# Patient Record
Sex: Female | Born: 1960 | Race: White | Hispanic: No | Smoking: Never smoker
Health system: Southern US, Community
[De-identification: ages and names within clinical notes are randomized; demographics above are authoritative.]

## PROBLEM LIST (undated history)

## (undated) DIAGNOSIS — R42 Dizziness and giddiness: Secondary | ICD-10-CM

## (undated) DIAGNOSIS — I341 Nonrheumatic mitral (valve) prolapse: Secondary | ICD-10-CM

## (undated) DIAGNOSIS — I2510 Atherosclerotic heart disease of native coronary artery without angina pectoris: Secondary | ICD-10-CM

## (undated) DIAGNOSIS — M79673 Pain in unspecified foot: Secondary | ICD-10-CM

## (undated) DIAGNOSIS — I209 Angina pectoris, unspecified: Secondary | ICD-10-CM

## (undated) DIAGNOSIS — Z9221 Personal history of antineoplastic chemotherapy: Secondary | ICD-10-CM

## (undated) DIAGNOSIS — C50919 Malignant neoplasm of unspecified site of unspecified female breast: Secondary | ICD-10-CM

## (undated) DIAGNOSIS — I10 Essential (primary) hypertension: Secondary | ICD-10-CM

## (undated) DIAGNOSIS — G5601 Carpal tunnel syndrome, right upper limb: Secondary | ICD-10-CM

## (undated) DIAGNOSIS — D473 Essential (hemorrhagic) thrombocythemia: Secondary | ICD-10-CM

## (undated) DIAGNOSIS — R87612 Low grade squamous intraepithelial lesion on cytologic smear of cervix (LGSIL): Secondary | ICD-10-CM

## (undated) DIAGNOSIS — F419 Anxiety disorder, unspecified: Secondary | ICD-10-CM

## (undated) DIAGNOSIS — G4730 Sleep apnea, unspecified: Secondary | ICD-10-CM

## (undated) DIAGNOSIS — M79643 Pain in unspecified hand: Secondary | ICD-10-CM

## (undated) DIAGNOSIS — M1990 Unspecified osteoarthritis, unspecified site: Secondary | ICD-10-CM

## (undated) DIAGNOSIS — Z923 Personal history of irradiation: Secondary | ICD-10-CM

## (undated) DIAGNOSIS — E785 Hyperlipidemia, unspecified: Secondary | ICD-10-CM

## (undated) HISTORY — PX: CARDIAC CATHETERIZATION: SHX172

## (undated) HISTORY — PX: TUBAL LIGATION: SHX77

## (undated) HISTORY — PX: CHOLECYSTECTOMY: SHX55

## (undated) HISTORY — DX: Pain in unspecified hand: M79.643

## (undated) HISTORY — DX: Malignant neoplasm of unspecified site of unspecified female breast: C50.919

## (undated) HISTORY — DX: Essential (primary) hypertension: I10

## (undated) HISTORY — PX: EYE SURGERY: SHX253

## (undated) HISTORY — DX: Unspecified osteoarthritis, unspecified site: M19.90

## (undated) HISTORY — DX: Atherosclerotic heart disease of native coronary artery without angina pectoris: I25.10

## (undated) HISTORY — DX: Pain in unspecified foot: M79.673

## (undated) HISTORY — DX: Hyperlipidemia, unspecified: E78.5

---

## 2005-01-16 ENCOUNTER — Ambulatory Visit: Payer: Self-pay | Admitting: Unknown Physician Specialty

## 2006-08-15 ENCOUNTER — Ambulatory Visit: Payer: Self-pay | Admitting: Unknown Physician Specialty

## 2007-09-22 ENCOUNTER — Emergency Department: Payer: Self-pay | Admitting: Emergency Medicine

## 2008-08-11 ENCOUNTER — Ambulatory Visit: Payer: Self-pay | Admitting: Unknown Physician Specialty

## 2009-06-14 ENCOUNTER — Ambulatory Visit: Payer: Self-pay | Admitting: Unknown Physician Specialty

## 2009-06-25 ENCOUNTER — Ambulatory Visit: Payer: Self-pay | Admitting: Unknown Physician Specialty

## 2009-07-29 ENCOUNTER — Ambulatory Visit: Payer: Self-pay | Admitting: Gastroenterology

## 2010-06-28 HISTORY — PX: HYSTEROSCOPY: SHX211

## 2010-08-04 ENCOUNTER — Ambulatory Visit: Payer: Self-pay | Admitting: Internal Medicine

## 2013-03-01 ENCOUNTER — Emergency Department: Payer: Self-pay | Admitting: Emergency Medicine

## 2013-03-01 LAB — URINALYSIS, COMPLETE
Bacteria: NONE SEEN
Blood: NEGATIVE
Glucose,UR: NEGATIVE mg/dL (ref 0–75)
Ketone: NEGATIVE
Leukocyte Esterase: NEGATIVE
Nitrite: NEGATIVE
RBC,UR: 1 /HPF (ref 0–5)
Specific Gravity: 1.01 (ref 1.003–1.030)
Squamous Epithelial: 1
WBC UR: <1 /HPF (ref 0–5)

## 2013-03-01 LAB — CBC
MCH: 31.7 pg (ref 26.0–34.0)
Platelet: 313 10*3/uL (ref 150–440)
RBC: 4.4 10*6/uL (ref 3.80–5.20)

## 2013-03-01 LAB — BASIC METABOLIC PANEL
Anion Gap: 7 (ref 7–16)
Co2: 23 mmol/L (ref 21–32)
EGFR (Non-African Amer.): >60
Osmolality: 277 (ref 275–301)

## 2013-07-16 ENCOUNTER — Ambulatory Visit: Payer: Self-pay | Admitting: Otolaryngology

## 2013-07-16 LAB — BASIC METABOLIC PANEL
Anion Gap: 10 (ref 7–16)
BUN: 16 mg/dL (ref 7–18)
Calcium, Total: 9.4 mg/dL (ref 8.5–10.1)
Co2: 28 mmol/L (ref 21–32)
Creatinine: 0.76 mg/dL (ref 0.60–1.30)
EGFR (African American): >60
EGFR (Non-African Amer.): >60
Glucose: 112 mg/dL — ABNORMAL HIGH (ref 65–99)
Potassium: 4.4 mmol/L (ref 3.5–5.1)
Sodium: 141 mmol/L (ref 136–145)

## 2013-10-21 ENCOUNTER — Ambulatory Visit: Payer: Self-pay | Admitting: Otolaryngology

## 2013-10-21 LAB — BASIC METABOLIC PANEL
ANION GAP: 12 (ref 7–16)
BUN: 20 mg/dL — ABNORMAL HIGH (ref 7–18)
CO2: 25 mmol/L (ref 21–32)
Calcium, Total: 9.4 mg/dL (ref 8.5–10.1)
Chloride: 99 mmol/L (ref 98–107)
Creatinine: 0.88 mg/dL (ref 0.60–1.30)
EGFR (African American): >60
Glucose: 97 mg/dL (ref 65–99)
OSMOLALITY: 274 (ref 275–301)
Potassium: 4 mmol/L (ref 3.5–5.1)
Sodium: 136 mmol/L (ref 136–145)

## 2013-11-17 ENCOUNTER — Emergency Department: Payer: Self-pay | Admitting: Emergency Medicine

## 2013-11-17 LAB — COMPREHENSIVE METABOLIC PANEL
ALT: 18 U/L (ref 12–78)
Albumin: 3.6 g/dL (ref 3.4–5.0)
Alkaline Phosphatase: 61 U/L
Anion Gap: 7 (ref 7–16)
BILIRUBIN TOTAL: 0.3 mg/dL (ref 0.2–1.0)
BUN: 15 mg/dL (ref 7–18)
CALCIUM: 9.5 mg/dL (ref 8.5–10.1)
Chloride: 104 mmol/L (ref 98–107)
Co2: 26 mmol/L (ref 21–32)
Creatinine: 0.91 mg/dL (ref 0.60–1.30)
EGFR (African American): >60
Glucose: 93 mg/dL (ref 65–99)
Osmolality: 274 (ref 275–301)
Potassium: 3.4 mmol/L — ABNORMAL LOW (ref 3.5–5.1)
SGOT(AST): 13 U/L — ABNORMAL LOW (ref 15–37)
SODIUM: 137 mmol/L (ref 136–145)
TOTAL PROTEIN: 7.5 g/dL (ref 6.4–8.2)

## 2013-11-17 LAB — PRO B NATRIURETIC PEPTIDE: B-TYPE NATIURETIC PEPTID: 33 pg/mL (ref 0–125)

## 2013-11-17 LAB — TROPONIN I: Troponin-I: <0.02 ng/mL

## 2013-11-17 LAB — CBC
HCT: 38.6 % (ref 35.0–47.0)
HGB: 13.2 g/dL (ref 12.0–16.0)
MCH: 31.3 pg (ref 26.0–34.0)
MCHC: 34.3 g/dL (ref 32.0–36.0)
MCV: 91 fL (ref 80–100)
Platelet: 319 10*3/uL (ref 150–440)
RBC: 4.23 10*6/uL (ref 3.80–5.20)
RDW: 12.6 % (ref 11.5–14.5)
WBC: 6.3 10*3/uL (ref 3.6–11.0)

## 2014-01-23 ENCOUNTER — Other Ambulatory Visit: Payer: Self-pay | Admitting: Cardiovascular Disease

## 2014-01-23 LAB — CREATININE, SERUM
Creatinine: 1.06 mg/dL (ref 0.60–1.30)
EGFR (African American): >60
EGFR (Non-African Amer.): 60 — ABNORMAL LOW

## 2014-01-23 LAB — BUN: BUN: 18 mg/dL (ref 7–18)

## 2014-01-23 LAB — HEMOGLOBIN: HGB: 14.1 g/dL (ref 12.0–16.0)

## 2014-01-23 LAB — POTASSIUM: Potassium: 3.4 mmol/L — ABNORMAL LOW (ref 3.5–5.1)

## 2014-01-23 LAB — HEMATOCRIT: HCT: 41.6 % (ref 35.0–47.0)

## 2014-01-26 ENCOUNTER — Ambulatory Visit: Payer: Self-pay | Admitting: Cardiovascular Disease

## 2014-02-03 DIAGNOSIS — R079 Chest pain, unspecified: Secondary | ICD-10-CM | POA: Insufficient documentation

## 2014-02-03 DIAGNOSIS — IMO0001 Reserved for inherently not codable concepts without codable children: Secondary | ICD-10-CM | POA: Insufficient documentation

## 2014-02-03 DIAGNOSIS — E785 Hyperlipidemia, unspecified: Secondary | ICD-10-CM | POA: Insufficient documentation

## 2014-02-03 DIAGNOSIS — I2510 Atherosclerotic heart disease of native coronary artery without angina pectoris: Secondary | ICD-10-CM | POA: Insufficient documentation

## 2014-02-03 DIAGNOSIS — I10 Essential (primary) hypertension: Secondary | ICD-10-CM | POA: Insufficient documentation

## 2014-02-03 DIAGNOSIS — M791 Myalgia, unspecified site: Secondary | ICD-10-CM | POA: Insufficient documentation

## 2014-02-13 DIAGNOSIS — I341 Nonrheumatic mitral (valve) prolapse: Secondary | ICD-10-CM | POA: Insufficient documentation

## 2014-03-31 ENCOUNTER — Encounter: Payer: Self-pay | Admitting: Cardiovascular Disease

## 2014-04-28 ENCOUNTER — Encounter: Payer: Self-pay | Admitting: Cardiovascular Disease

## 2014-05-20 ENCOUNTER — Ambulatory Visit (INDEPENDENT_AMBULATORY_CARE_PROVIDER_SITE_OTHER): Payer: BC Managed Care – PPO

## 2014-05-20 ENCOUNTER — Encounter: Payer: Self-pay | Admitting: Podiatry

## 2014-05-20 ENCOUNTER — Ambulatory Visit (INDEPENDENT_AMBULATORY_CARE_PROVIDER_SITE_OTHER): Payer: BC Managed Care – PPO | Admitting: Podiatry

## 2014-05-20 VITALS — BP 112/72 | HR 75 | Resp 16 | Ht 66.0 in | Wt 210.0 lb

## 2014-05-20 DIAGNOSIS — M722 Plantar fascial fibromatosis: Secondary | ICD-10-CM

## 2014-05-20 NOTE — Progress Notes (Signed)
She presents today after having I seen her for approximately one year complaining of painful bilateral heels. She states that while walking on the beach and running digits his as well as exercising she's noticed that her feet seem to be doing worse. She states that they're usually sore but seem to be able to manage them. However she has not been able to manage them since wearing different shoes over the summer.  Objective: Vital signs are stable she is alert and oriented x3. She has pain on palpation medial continued tubercles bilateral. Pulses are palpable bilateral. No open wounds are noted. Radiographic evaluation demonstrates soft tissue increase in density at the plantar fascial calcaneal insertion site indicative of plantar fasciitis.  Assessment: Pain in heels associated with plantar fasciitis bilateral.  Plan: Discussed appropriate shoe gear stretching exercises ice therapy shoe gear modifications. Going as far as her right down several types of shoes. I injected her bilateral heels today with Kenalog and local anesthetic. She states that she wears her orthotics on occasions but not necessarily while she is working out.

## 2014-05-28 ENCOUNTER — Encounter: Payer: Self-pay | Admitting: Cardiovascular Disease

## 2014-06-28 ENCOUNTER — Encounter: Payer: Self-pay | Admitting: Cardiovascular Disease

## 2014-07-28 ENCOUNTER — Encounter: Payer: Self-pay | Admitting: Cardiovascular Disease

## 2015-04-27 LAB — HM MAMMOGRAPHY

## 2016-04-12 ENCOUNTER — Ambulatory Visit (INDEPENDENT_AMBULATORY_CARE_PROVIDER_SITE_OTHER): Payer: BLUE CROSS/BLUE SHIELD | Admitting: Podiatry

## 2016-04-12 ENCOUNTER — Ambulatory Visit (INDEPENDENT_AMBULATORY_CARE_PROVIDER_SITE_OTHER): Payer: BLUE CROSS/BLUE SHIELD

## 2016-04-12 ENCOUNTER — Encounter: Payer: Self-pay | Admitting: Podiatry

## 2016-04-12 VITALS — BP 134/88 | HR 78 | Resp 12

## 2016-04-12 DIAGNOSIS — M7661 Achilles tendinitis, right leg: Secondary | ICD-10-CM

## 2016-04-12 NOTE — Progress Notes (Signed)
She presents today with chief complaint of pain to the right posterior aspect of her heel. She states this than bothering her for quite some time but her plantar fasciitis has resolved 100%. She states that recently she has had had to have a root canal performed and she's been taking Tylenol and ibuprofen regularly she states that she notices that her heel is doing much better.  Objective: Vital signs are stable she is alert and oriented 3 pulses are palpable no calf pain. She has pain on direct palpation of the Achilles tendon on the posterior aspect of the calcaneus. Radiographs taken today demonstrate an osseously mature individual lateral view demonstrates a thickening of the Achilles at its insertion site with a very small proximally oriented calcaneal heel spur. No signs of intratendinous calcification. No open lesions or wounds on cutaneous evaluation.  Assessment: Insertional Achilles tendinitis right heel.  Plan: I injected subcutaneously today 2 mg of dexamethasone at the site of maximal tenderness. I recommended that she utilize her night splint that she has at home for nighttime use as well as starting One-A-Day meloxicam. I will follow-up with her in 1 month we also discussed the etiology pathology conservative versus surgical therapies we also discussed appropriate shoe gear stretching exercises and ice therapy.

## 2016-07-05 LAB — HM PAP SMEAR

## 2016-12-26 ENCOUNTER — Encounter: Payer: Self-pay | Admitting: *Deleted

## 2016-12-28 NOTE — Discharge Instructions (Signed)
Cataract Surgery, Care After °Refer to this sheet in the next few weeks. These instructions provide you with information about caring for yourself after your procedure. Your health care provider may also give you more specific instructions. Your treatment has been planned according to current medical practices, but problems sometimes occur. Call your health care provider if you have any problems or questions after your procedure. °What can I expect after the procedure? °After the procedure, it is common to have: °· Itching. °· Discomfort. °· Fluid discharge. °· Sensitivity to light and to touch. °· Bruising. °Follow these instructions at home: °Eye Care  °· Check your eye every day for signs of infection. Watch for: °¨ Redness, swelling, or pain. °¨ Fluid, blood, or pus. °¨ Warmth. °¨ Bad smell. °Activity  °· Avoid strenuous activities, such as playing contact sports, for as long as told by your health care provider. °· Do not drive or operate heavy machinery until your health care provider approves. °· Do not bend or lift heavy objects . Bending increases pressure in the eye. You can walk, climb stairs, and do light household chores. °· Ask your health care provider when you can return to work. If you work in a dusty environment, you may be advised to wear protective eyewear for a period of time. °General instructions  °· Take or apply over-the-counter and prescription medicines only as told by your health care provider. This includes eye drops. °· Do not touch or rub your eyes. °· If you were given a protective shield, wear it as told by your health care provider. If you were not given a protective shield, wear sunglasses as told by your health care provider to protect your eyes. °· Keep the area around your eye clean and dry. Avoid swimming or allowing water to hit you directly in the face while showering until told by your health care provider. Keep soap and shampoo out of your eyes. °· Do not put a contact lens  into the affected eye or eyes until your health care provider approves. °· Keep all follow-up visits as told by your health care provider. This is important. °Contact a health care provider if: ° °· You have increased bruising around your eye. °· You have pain that is not helped with medicine. °· You have a fever. °· You have redness, swelling, or pain in your eye. °· You have fluid, blood, or pus coming from your incision. °· Your vision gets worse. °Get help right away if: °· You have sudden vision loss. °This information is not intended to replace advice given to you by your health care provider. Make sure you discuss any questions you have with your health care provider. °Document Released: 03/03/2005 Document Revised: 12/23/2015 Document Reviewed: 06/24/2015 °Elsevier Interactive Patient Education © 2017 [employer] ° ° ° ° °General Anesthesia, Adult, Care After °These instructions provide you with information about caring for yourself after your procedure. Your health care provider may also give you more specific instructions. Your treatment has been planned according to current medical practices, but problems sometimes occur. Call your health care provider if you have any problems or questions after your procedure. °What can I expect after the procedure? °After the procedure, it is common to have: °· Vomiting. °· A sore throat. °· Mental slowness. °It is common to feel: °· Nauseous. °· Cold or shivery. °· Sleepy. °· Tired. °· Sore or achy, even in parts of your body where you did not have surgery. °Follow these instructions at   home: °For at least 24 hours after the procedure:  °· Do not: °¨ Participate in activities where you could fall or become injured. °¨ Drive. °¨ Use heavy machinery. °¨ Drink alcohol. °¨ Take sleeping pills or medicines that cause drowsiness. °¨ Make important decisions or sign legal documents. °¨ Take care of children on your own. °· Rest. °Eating and drinking  °· If you vomit, drink  water, juice, or soup when you can drink without vomiting. °· Drink enough fluid to keep your urine clear or pale yellow. °· Make sure you have little or no nausea before eating solid foods. °· Follow the diet recommended by your health care provider. °General instructions  °· Have a responsible adult stay with you until you are awake and alert. °· Return to your normal activities as told by your health care provider. Ask your health care provider what activities are safe for you. °· Take over-the-counter and prescription medicines only as told by your health care provider. °· If you smoke, do not smoke without supervision. °· Keep all follow-up visits as told by your health care provider. This is important. °Contact a health care provider if: °· You continue to have nausea or vomiting at home, and medicines are not helpful. °· You cannot drink fluids or start eating again. °· You cannot urinate after 8-12 hours. °· You develop a skin rash. °· You have fever. °· You have increasing redness at the site of your procedure. °Get help right away if: °· You have difficulty breathing. °· You have chest pain. °· You have unexpected bleeding. °· You feel that you are having a life-threatening or urgent problem. °This information is not intended to replace advice given to you by your health care provider. Make sure you discuss any questions you have with your health care provider. °Document Released: 11/20/2000 Document Revised: 01/17/2016 Document Reviewed: 07/29/2015 °Elsevier Interactive Patient Education © 2017 [employer] ° °

## 2017-01-01 ENCOUNTER — Ambulatory Visit: Payer: BLUE CROSS/BLUE SHIELD | Admitting: Anesthesiology

## 2017-01-01 ENCOUNTER — Ambulatory Visit
Admission: RE | Admit: 2017-01-01 | Discharge: 2017-01-01 | Disposition: A | Discharge status: Home / Self Care | Payer: BLUE CROSS/BLUE SHIELD | Source: Ambulatory Visit | Attending: Ophthalmology | Admitting: Ophthalmology

## 2017-01-01 ENCOUNTER — Encounter
Admission: RE | Disposition: A | Discharge status: Home / Self Care | Payer: Self-pay | Source: Ambulatory Visit | Attending: Ophthalmology

## 2017-01-01 DIAGNOSIS — I10 Essential (primary) hypertension: Secondary | ICD-10-CM | POA: Insufficient documentation

## 2017-01-01 DIAGNOSIS — G709 Myoneural disorder, unspecified: Secondary | ICD-10-CM | POA: Insufficient documentation

## 2017-01-01 DIAGNOSIS — H2512 Age-related nuclear cataract, left eye: Secondary | ICD-10-CM | POA: Insufficient documentation

## 2017-01-01 HISTORY — DX: Carpal tunnel syndrome, right upper limb: G56.01

## 2017-01-01 HISTORY — DX: Nonrheumatic mitral (valve) prolapse: I34.1

## 2017-01-01 HISTORY — PX: CATARACT EXTRACTION W/PHACO: SHX586

## 2017-01-01 HISTORY — DX: Dizziness and giddiness: R42

## 2017-01-01 SURGERY — PHACOEMULSIFICATION, CATARACT, WITH IOL INSERTION
Anesthesia: Monitor Anesthesia Care | Site: Eye | Laterality: Left | Wound class: Clean

## 2017-01-01 MED ORDER — CEFUROXIME OPHTHALMIC INJECTION 1 MG/0.1 ML
INJECTION | OPHTHALMIC | Status: DC | PRN
  Administered 2017-01-01: .3 mL via OPHTHALMIC

## 2017-01-01 MED ORDER — MOXIFLOXACIN HCL 0.5 % OP SOLN
1.0000 [drp] | OPHTHALMIC | Status: DC | PRN
  Administered 2017-01-01 (×3): 1 [drp] via OPHTHALMIC

## 2017-01-01 MED ORDER — NA HYALUR & NA CHOND-NA HYALUR 0.4-0.35 ML IO KIT
PACK | INTRAOCULAR | Status: DC | PRN
Start: 2017-01-01 — End: 2017-01-01
  Administered 2017-01-01: 1 mL via INTRAOCULAR

## 2017-01-01 MED ORDER — BALANCED SALT IO SOLN
INTRAOCULAR | Status: DC | PRN
  Administered 2017-01-01: 1 mL via INTRAOCULAR

## 2017-01-01 MED ORDER — EPINEPHRINE PF 1 MG/ML IJ SOLN
INTRAOCULAR | Status: DC | PRN
  Administered 2017-01-01: 64 mL via OPHTHALMIC

## 2017-01-01 MED ORDER — BRIMONIDINE TARTRATE-TIMOLOL 0.2-0.5 % OP SOLN
OPHTHALMIC | Status: DC | PRN
  Administered 2017-01-01: 1 [drp] via OPHTHALMIC

## 2017-01-01 MED ORDER — LACTATED RINGERS IV SOLN: INTRAVENOUS | Status: DC

## 2017-01-01 MED ORDER — ARMC OPHTHALMIC DILATING DROPS
1.0000 "application " | OPHTHALMIC | Status: DC | PRN
  Administered 2017-01-01 (×3): 1 via OPHTHALMIC

## 2017-01-01 MED ORDER — MIDAZOLAM HCL 2 MG/2ML IJ SOLN
INTRAMUSCULAR | Status: DC | PRN
  Administered 2017-01-01: 2 mg via INTRAVENOUS

## 2017-01-01 MED ORDER — FENTANYL CITRATE (PF) 100 MCG/2ML IJ SOLN
INTRAMUSCULAR | Status: DC | PRN
  Administered 2017-01-01: 50 ug via INTRAVENOUS

## 2017-01-01 SURGICAL SUPPLY — 25 items
CANNULA ANT/CHMB 27GA (MISCELLANEOUS) ×2 IMPLANT
CARTRIDGE ABBOTT (MISCELLANEOUS) IMPLANT
GLOVE SURG LX 7.5 STRW (GLOVE) ×1
GLOVE SURG LX STRL 7.5 STRW (GLOVE) ×1 IMPLANT
GLOVE SURG TRIUMPH 8.0 PF LTX (GLOVE) ×2 IMPLANT
GOWN STRL REUS W/ TWL LRG LVL3 (GOWN DISPOSABLE) ×2 IMPLANT
GOWN STRL REUS W/TWL LRG LVL3 (GOWN DISPOSABLE) ×2
LENS IOL TECNIS ITEC 15.5 (Intraocular Lens) ×2 IMPLANT
MARKER SKIN DUAL TIP RULER LAB (MISCELLANEOUS) ×2 IMPLANT
NDL RETROBULBAR .5 NSTRL (NEEDLE) IMPLANT
NEEDLE FILTER BLUNT 18X 1/2SAF (NEEDLE) ×1
NEEDLE FILTER BLUNT 18X1 1/2 (NEEDLE) ×1 IMPLANT
PACK CATARACT BRASINGTON (MISCELLANEOUS) ×2 IMPLANT
PACK EYE AFTER SURG (MISCELLANEOUS) ×2 IMPLANT
PACK OPTHALMIC (MISCELLANEOUS) ×2 IMPLANT
RING MALYGIN 7.0 (MISCELLANEOUS) IMPLANT
SUT ETHILON 10-0 CS-B-6CS-B-6 (SUTURE)
SUT VICRYL  9 0 (SUTURE)
SUT VICRYL 9 0 (SUTURE) IMPLANT
SUTURE EHLN 10-0 CS-B-6CS-B-6 (SUTURE) IMPLANT
SYR 3ML LL SCALE MARK (SYRINGE) ×2 IMPLANT
SYR 5ML LL (SYRINGE) ×2 IMPLANT
SYR TB 1ML LUER SLIP (SYRINGE) ×2 IMPLANT
WATER STERILE IRR 250ML POUR (IV SOLUTION) ×2 IMPLANT
WIPE NON LINTING 3.25X3.25 (MISCELLANEOUS) ×2 IMPLANT

## 2017-01-01 NOTE — Anesthesia Procedure Notes (Signed)
Procedure Name: MAC Performed by: Arlando Leisinger Pre-anesthesia Checklist: Patient identified, Emergency Drugs available, Suction available, Timeout performed and Patient being monitored Patient Re-evaluated:Patient Re-evaluated prior to inductionOxygen Delivery Method: Nasal cannula Placement Confirmation: positive ETCO2     

## 2017-01-01 NOTE — Op Note (Signed)
OPERATIVE NOTE  Erin Church [id number] 01/01/2017   PREOPERATIVE DIAGNOSIS:  Nuclear sclerotic cataract left eye. H25.12   POSTOPERATIVE DIAGNOSIS:    Nuclear sclerotic cataract left eye.     PROCEDURE:  Phacoemusification with posterior chamber intraocular lens placement of the left eye   LENS:   Implant Name Type Inv. Item Serial No. Manufacturer Lot No. LRB No. Used  LENS IOL DIOP 15.5 - [id number] Intraocular Lens LENS IOL DIOP 15.5 [id number] AMO   Left 1        ULTRASOUND TIME: 11  % of 1 minutes 2 seconds, CDE 7.1  SURGEON:  Wyonia Hough, MD   ANESTHESIA:  Topical with tetracaine drops and 2% Xylocaine jelly, augmented with 1% preservative-free intracameral lidocaine.    COMPLICATIONS:  None.   DESCRIPTION OF PROCEDURE:  The patient was identified in the holding room and transported to the operating room and placed in the supine position under the operating microscope.  The left eye was identified as the operative eye and it was prepped and draped in the usual sterile ophthalmic fashion.   A 1 millimeter clear-corneal paracentesis was made at the 1:30 position.  0.5 ml of preservative-free 1% lidocaine was injected into the anterior chamber.  The anterior chamber was filled with Viscoat viscoelastic.  A 2.4 millimeter keratome was used to make a near-clear corneal incision at the 10:30 position.  .  A curvilinear capsulorrhexis was made with a cystotome and capsulorrhexis forceps.  Balanced salt solution was used to hydrodissect and hydrodelineate the nucleus.   Phacoemulsification was then used in stop and chop fashion to remove the lens nucleus and epinucleus.  The remaining cortex was then removed using the irrigation and aspiration handpiece. Provisc was then placed into the capsular bag to distend it for lens placement.  A lens was then injected into the capsular bag.  The remaining viscoelastic was aspirated.   Wounds were hydrated with balanced salt  solution.  The anterior chamber was inflated to a physiologic pressure with balanced salt solution.  No wound leaks were noted. Cefuroxime 0.1 ml of a 10mg /ml solution was injected into the anterior chamber for a dose of 1 mg of intracameral antibiotic at the completion of the case.   Timolol and Brimonidine drops were applied to the eye.  The patient was taken to the recovery room in stable condition without complications of anesthesia or surgery.  Erin Church 01/01/2017, 8:07 AM

## 2017-01-01 NOTE — H&P (Signed)
The History and Physical notes are on paper, have been signed, and are to be scanned. The patient remains stable and unchanged from the H&P.   Previous H&P reviewed, patient examined, and there are no changes.  Erin Church 01/01/2017 7:40 AM

## 2017-01-01 NOTE — Anesthesia Postprocedure Evaluation (Signed)
Anesthesia Post Note  Patient: Erin Church  Procedure(s) Performed: Procedure(s) (LRB): CATARACT EXTRACTION PHACO AND INTRAOCULAR LENS PLACEMENT (IOC)left Restor lens (Left)  Patient location during evaluation: PACU Anesthesia Type: MAC Level of consciousness: awake and alert and oriented Pain management: satisfactory to patient Vital Signs Assessment: post-procedure vital signs reviewed and stable Respiratory status: spontaneous breathing, nonlabored ventilation and respiratory function stable Cardiovascular status: blood pressure returned to baseline and stable Postop Assessment: Adequate PO intake and No signs of nausea or vomiting Anesthetic complications: no    Raliegh Ip

## 2017-01-01 NOTE — Anesthesia Preprocedure Evaluation (Signed)
Anesthesia Evaluation  Patient identified by MRN, date of birth, ID band Patient awake    Reviewed: Allergy & Precautions, H&P , NPO status , Patient's Chart, lab work & pertinent test results  Airway Mallampati: II  TM Distance: >3 FB Neck ROM: full    Dental no notable dental hx.    Pulmonary    Pulmonary exam normal        Cardiovascular hypertension, Normal cardiovascular exam     Neuro/Psych  Neuromuscular disease    GI/Hepatic   Endo/Other    Renal/GU      Musculoskeletal   Abdominal   Peds  Hematology   Anesthesia Other Findings   Reproductive/Obstetrics                             Anesthesia Physical Anesthesia Plan  ASA: II  Anesthesia Plan: MAC   Post-op Pain Management:    Induction:   Airway Management Planned:   Additional Equipment:   Intra-op Plan:   Post-operative Plan:   Informed Consent: I have reviewed the patients History and Physical, chart, labs and discussed the procedure including the risks, benefits and alternatives for the proposed anesthesia with the patient or authorized representative who has indicated his/her understanding and acceptance.     Plan Discussed with:   Anesthesia Plan Comments:         Anesthesia Quick Evaluation

## 2017-01-01 NOTE — Transfer of Care (Signed)
Immediate Anesthesia Transfer of Care Note  Patient: Erin Church  Procedure(s) Performed: Procedure(s) with comments: CATARACT EXTRACTION PHACO AND INTRAOCULAR LENS PLACEMENT (IOC)left Restor lens (Left) - Restor lens  Patient Location: PACU  Anesthesia Type: MAC  Level of Consciousness: awake, alert  and patient cooperative  Airway and Oxygen Therapy: Patient Spontanous Breathing and Patient connected to supplemental oxygen  Post-op Assessment: Post-op Vital signs reviewed, Patient's Cardiovascular Status Stable, Respiratory Function Stable, Patent Airway and No signs of Nausea or vomiting  Post-op Vital Signs: Reviewed and stable  Complications: No apparent anesthesia complications

## 2017-01-02 ENCOUNTER — Encounter: Payer: Self-pay | Admitting: Ophthalmology

## 2017-01-25 NOTE — Discharge Instructions (Signed)

## 2017-01-31 ENCOUNTER — Ambulatory Visit: Payer: BLUE CROSS/BLUE SHIELD | Admitting: Anesthesiology

## 2017-01-31 ENCOUNTER — Encounter
Admission: RE | Disposition: A | Discharge status: Home / Self Care | Payer: Self-pay | Source: Ambulatory Visit | Attending: Ophthalmology

## 2017-01-31 ENCOUNTER — Ambulatory Visit
Admission: RE | Admit: 2017-01-31 | Discharge: 2017-01-31 | Disposition: A | Discharge status: Home / Self Care | Payer: BLUE CROSS/BLUE SHIELD | Source: Ambulatory Visit | Attending: Ophthalmology | Admitting: Ophthalmology

## 2017-01-31 DIAGNOSIS — Z9841 Cataract extraction status, right eye: Secondary | ICD-10-CM | POA: Insufficient documentation

## 2017-01-31 DIAGNOSIS — R42 Dizziness and giddiness: Secondary | ICD-10-CM | POA: Diagnosis not present

## 2017-01-31 DIAGNOSIS — I10 Essential (primary) hypertension: Secondary | ICD-10-CM | POA: Diagnosis not present

## 2017-01-31 DIAGNOSIS — H2511 Age-related nuclear cataract, right eye: Secondary | ICD-10-CM | POA: Insufficient documentation

## 2017-01-31 DIAGNOSIS — Z9049 Acquired absence of other specified parts of digestive tract: Secondary | ICD-10-CM | POA: Insufficient documentation

## 2017-01-31 DIAGNOSIS — F419 Anxiety disorder, unspecified: Secondary | ICD-10-CM | POA: Insufficient documentation

## 2017-01-31 HISTORY — PX: CATARACT EXTRACTION W/PHACO: SHX586

## 2017-01-31 SURGERY — PHACOEMULSIFICATION, CATARACT, WITH IOL INSERTION
Anesthesia: Monitor Anesthesia Care | Site: Eye | Laterality: Right | Wound class: Clean

## 2017-01-31 MED ORDER — MOXIFLOXACIN HCL 0.5 % OP SOLN
1.0000 [drp] | OPHTHALMIC | Status: DC | PRN
  Administered 2017-01-31 (×3): 1 [drp] via OPHTHALMIC

## 2017-01-31 MED ORDER — ACETAMINOPHEN 160 MG/5ML PO SOLN: 325.0000 mg | ORAL | Status: DC | PRN

## 2017-01-31 MED ORDER — ARMC OPHTHALMIC DILATING DROPS
1.0000 "application " | OPHTHALMIC | Status: DC | PRN
  Administered 2017-01-31 (×3): 1 via OPHTHALMIC

## 2017-01-31 MED ORDER — ACETAMINOPHEN 325 MG PO TABS: 325.0000 mg | ORAL_TABLET | ORAL | Status: DC | PRN

## 2017-01-31 MED ORDER — ONDANSETRON HCL 4 MG/2ML IJ SOLN: 4.0000 mg | Freq: Once | INTRAMUSCULAR | Status: DC | PRN

## 2017-01-31 MED ORDER — EPINEPHRINE PF 1 MG/ML IJ SOLN
INTRAOCULAR | Status: DC | PRN
  Administered 2017-01-31: 61 mL via OPHTHALMIC

## 2017-01-31 MED ORDER — BRIMONIDINE TARTRATE-TIMOLOL 0.2-0.5 % OP SOLN
OPHTHALMIC | Status: DC | PRN
  Administered 2017-01-31: 1 [drp] via OPHTHALMIC

## 2017-01-31 MED ORDER — MIDAZOLAM HCL 2 MG/2ML IJ SOLN
INTRAMUSCULAR | Status: DC | PRN
  Administered 2017-01-31: 2 mg via INTRAVENOUS

## 2017-01-31 MED ORDER — FENTANYL CITRATE (PF) 100 MCG/2ML IJ SOLN
INTRAMUSCULAR | Status: DC | PRN
  Administered 2017-01-31: 50 ug via INTRAVENOUS

## 2017-01-31 MED ORDER — NA HYALUR & NA CHOND-NA HYALUR 0.4-0.35 ML IO KIT
PACK | INTRAOCULAR | Status: DC | PRN
  Administered 2017-01-31: 1 mL via INTRAOCULAR

## 2017-01-31 MED ORDER — LIDOCAINE HCL (PF) 2 % IJ SOLN
INTRAOCULAR | Status: DC | PRN
  Administered 2017-01-31: 1 mL via INTRAOCULAR

## 2017-01-31 MED ORDER — CEFUROXIME OPHTHALMIC INJECTION 1 MG/0.1 ML
INJECTION | OPHTHALMIC | Status: DC | PRN
  Administered 2017-01-31: .3 mL via OPHTHALMIC

## 2017-01-31 SURGICAL SUPPLY — 25 items
CANNULA ANT/CHMB 27GA (MISCELLANEOUS) ×3 IMPLANT
CARTRIDGE ABBOTT (MISCELLANEOUS) IMPLANT
GLOVE SURG LX 7.5 STRW (GLOVE) ×2
GLOVE SURG LX STRL 7.5 STRW (GLOVE) ×1 IMPLANT
GLOVE SURG TRIUMPH 8.0 PF LTX (GLOVE) ×3 IMPLANT
GOWN STRL REUS W/ TWL LRG LVL3 (GOWN DISPOSABLE) ×2 IMPLANT
GOWN STRL REUS W/TWL LRG LVL3 (GOWN DISPOSABLE) ×4
LENS IOL TECNIS ITEC 15.5 (Intraocular Lens) ×3 IMPLANT
MARKER SKIN DUAL TIP RULER LAB (MISCELLANEOUS) ×3 IMPLANT
NDL RETROBULBAR .5 NSTRL (NEEDLE) IMPLANT
NEEDLE FILTER BLUNT 18X 1/2SAF (NEEDLE) ×2
NEEDLE FILTER BLUNT 18X1 1/2 (NEEDLE) ×1 IMPLANT
PACK CATARACT BRASINGTON (MISCELLANEOUS) ×3 IMPLANT
PACK EYE AFTER SURG (MISCELLANEOUS) ×3 IMPLANT
PACK OPTHALMIC (MISCELLANEOUS) ×3 IMPLANT
RING MALYGIN 7.0 (MISCELLANEOUS) IMPLANT
SUT ETHILON 10-0 CS-B-6CS-B-6 (SUTURE)
SUT VICRYL  9 0 (SUTURE)
SUT VICRYL 9 0 (SUTURE) IMPLANT
SUTURE EHLN 10-0 CS-B-6CS-B-6 (SUTURE) IMPLANT
SYR 3ML LL SCALE MARK (SYRINGE) ×3 IMPLANT
SYR 5ML LL (SYRINGE) ×3 IMPLANT
SYR TB 1ML LUER SLIP (SYRINGE) ×3 IMPLANT
WATER STERILE IRR 250ML POUR (IV SOLUTION) ×3 IMPLANT
WIPE NON LINTING 3.25X3.25 (MISCELLANEOUS) ×3 IMPLANT

## 2017-01-31 NOTE — H&P (Signed)
The History and Physical notes are on paper, have been signed, and are to be scanned. The patient remains stable and unchanged from the H&P.   Previous H&P reviewed, patient examined, and there are no changes.  Erin Church 01/31/2017 11:12 AM   

## 2017-01-31 NOTE — Anesthesia Preprocedure Evaluation (Signed)
Anesthesia Evaluation  Patient identified by MRN, date of birth, ID band Patient awake    Reviewed: Allergy & Precautions, H&P , NPO status , Patient's Chart, lab work & pertinent test results  Airway Mallampati: II  TM Distance: >3 FB Neck ROM: full    Dental no notable dental hx.    Pulmonary    Pulmonary exam normal        Cardiovascular hypertension, Normal cardiovascular exam     Neuro/Psych  Neuromuscular disease    GI/Hepatic   Endo/Other    Renal/GU      Musculoskeletal   Abdominal   Peds  Hematology   Anesthesia Other Findings   Reproductive/Obstetrics                             Anesthesia Physical  Anesthesia Plan  ASA: II  Anesthesia Plan: MAC   Post-op Pain Management:    Induction:   PONV Risk Score and Plan:   Airway Management Planned:   Additional Equipment:   Intra-op Plan:   Post-operative Plan:   Informed Consent: I have reviewed the patients History and Physical, chart, labs and discussed the procedure including the risks, benefits and alternatives for the proposed anesthesia with the patient or authorized representative who has indicated his/her understanding and acceptance.     Plan Discussed with:   Anesthesia Plan Comments:         Anesthesia Quick Evaluation

## 2017-01-31 NOTE — Anesthesia Procedure Notes (Signed)
Procedure Name: MAC Performed by: Desira Alessandrini Pre-anesthesia Checklist: Patient identified, Emergency Drugs available, Suction available, Timeout performed and Patient being monitored Patient Re-evaluated:Patient Re-evaluated prior to inductionOxygen Delivery Method: Nasal cannula Placement Confirmation: positive ETCO2     

## 2017-01-31 NOTE — Op Note (Signed)
LOCATION:  [facility]   PREOPERATIVE DIAGNOSIS:    Nuclear sclerotic cataract right eye. H25.11   POSTOPERATIVE DIAGNOSIS:  Nuclear sclerotic cataract right eye.     PROCEDURE:  Phacoemusification with posterior chamber intraocular lens placement of the right eye   LENS:   Implant Name Type Inv. Item Serial No. Manufacturer Lot No. LRB No. Used  LENS IOL DIOP 15.5 - [id number] Intraocular Lens LENS IOL DIOP 15.5 [id number] AMO   Right 1        ULTRASOUND TIME: 12 % of 1 minutes, 4 seconds.  CDE 7.9   SURGEON:  Wyonia Hough, MD   ANESTHESIA:  Topical with tetracaine drops and 2% Xylocaine jelly, augmented with 1% preservative-free intracameral lidocaine.    COMPLICATIONS:  None.   DESCRIPTION OF PROCEDURE:  The patient was identified in the holding room and transported to the operating room and placed in the supine position under the operating microscope.  The right eye was identified as the operative eye and it was prepped and draped in the usual sterile ophthalmic fashion.   A 1 millimeter clear-corneal paracentesis was made at the 12:00 position.  0.5 ml of preservative-free 1% lidocaine was injected into the anterior chamber. The anterior chamber was filled with Viscoat viscoelastic.  A 2.4 millimeter keratome was used to make a near-clear corneal incision at the 9:00 position.  A curvilinear capsulorrhexis was made with a cystotome and capsulorrhexis forceps.  Balanced salt solution was used to hydrodissect and hydrodelineate the nucleus.   Phacoemulsification was then used in stop and chop fashion to remove the lens nucleus and epinucleus.  The remaining cortex was then removed using the irrigation and aspiration handpiece. Provisc was then placed into the capsular bag to distend it for lens placement.  A lens was then injected into the capsular bag.  The remaining viscoelastic was aspirated.   Wounds were hydrated with balanced salt solution.  The anterior  chamber was inflated to a physiologic pressure with balanced salt solution.  No wound leaks were noted. Cefuroxime 0.1 ml of a 10mg /ml solution was injected into the anterior chamber for a dose of 1 mg of intracameral antibiotic at the completion of the case.   Timolol and Brimonidine drops were applied to the eye.  The patient was taken to the recovery room in stable condition without complications of anesthesia or surgery.   Efstathios Sawin 01/31/2017, 12:33 PM

## 2017-01-31 NOTE — Anesthesia Postprocedure Evaluation (Signed)
Anesthesia Post Note  Patient: Erin Church  Procedure(s) Performed: Procedure(s) (LRB): CATARACT EXTRACTION PHACO AND INTRAOCULAR LENS PLACEMENT (IOC) (Right)  Patient location during evaluation: PACU Anesthesia Type: MAC Level of consciousness: awake and alert Pain management: pain level controlled Vital Signs Assessment: post-procedure vital signs reviewed and stable Respiratory status: spontaneous breathing, nonlabored ventilation and respiratory function stable Cardiovascular status: stable Anesthetic complications: no    Veda Canning

## 2017-01-31 NOTE — Transfer of Care (Signed)
Immediate Anesthesia Transfer of Care Note  Patient: Erin Church  Procedure(s) Performed: Procedure(s): CATARACT EXTRACTION PHACO AND INTRAOCULAR LENS PLACEMENT (IOC) (Right)  Patient Location: PACU  Anesthesia Type: MAC  Level of Consciousness: awake, alert  and patient cooperative  Airway and Oxygen Therapy: Patient Spontanous Breathing and Patient connected to supplemental oxygen  Post-op Assessment: Post-op Vital signs reviewed, Patient's Cardiovascular Status Stable, Respiratory Function Stable, Patent Airway and No signs of Nausea or vomiting  Post-op Vital Signs: Reviewed and stable  Complications: No apparent anesthesia complications

## 2017-02-01 ENCOUNTER — Encounter: Payer: Self-pay | Admitting: Ophthalmology

## 2017-04-10 ENCOUNTER — Ambulatory Visit (INDEPENDENT_AMBULATORY_CARE_PROVIDER_SITE_OTHER): Payer: BLUE CROSS/BLUE SHIELD | Admitting: Podiatry

## 2017-04-10 ENCOUNTER — Other Ambulatory Visit: Payer: Self-pay

## 2017-04-10 ENCOUNTER — Ambulatory Visit (INDEPENDENT_AMBULATORY_CARE_PROVIDER_SITE_OTHER): Payer: BLUE CROSS/BLUE SHIELD

## 2017-04-10 ENCOUNTER — Ambulatory Visit: Payer: BLUE CROSS/BLUE SHIELD

## 2017-04-10 ENCOUNTER — Encounter: Payer: Self-pay | Admitting: Podiatry

## 2017-04-10 DIAGNOSIS — M79673 Pain in unspecified foot: Secondary | ICD-10-CM

## 2017-04-10 DIAGNOSIS — M722 Plantar fascial fibromatosis: Secondary | ICD-10-CM | POA: Diagnosis not present

## 2017-04-10 MED ORDER — METHYLPREDNISOLONE 4 MG PO TBPK
ORAL_TABLET | ORAL | 0 refills | Status: DC
Start: 2017-04-10 — End: 2017-04-10

## 2017-04-10 MED ORDER — DICLOFENAC SODIUM 75 MG PO TBEC: 75.0000 mg | DELAYED_RELEASE_TABLET | Freq: Two times a day (BID) | ORAL | 0 refills | Status: DC

## 2017-04-10 MED ORDER — METHYLPREDNISOLONE 4 MG PO TBPK: ORAL_TABLET | ORAL | 0 refills | Status: DC

## 2017-04-10 MED ORDER — DICLOFENAC SODIUM 75 MG PO TBEC
75.0000 mg | DELAYED_RELEASE_TABLET | Freq: Two times a day (BID) | ORAL | 0 refills | Status: DC
Start: 2017-04-10 — End: 2020-10-26

## 2017-04-16 MED ORDER — BETAMETHASONE SOD PHOS & ACET 6 (3-3) MG/ML IJ SUSP: 3.0000 mg | Freq: Once | INTRAMUSCULAR | Status: DC

## 2017-04-16 NOTE — Progress Notes (Signed)
   Subjective: Patient presents today for pain and tenderness in the feet bilaterally. Patient states the foot pain has been hurting for several weeks now. Patient states that it hurts in the mornings with the first steps out of bed. Patient presents today for further treatment and evaluation. Patient was last seen on 04/12/2016 for Achilles tendinitis to the right posterior heel. Patient was seen by Dr. Milinda Pointer. Patient no longer has symptoms regarding the right posterior heel. Patient presents today with a new complaint.  Objective: Physical Exam General: The patient is alert and oriented x3 in no acute distress.  Dermatology: Skin is warm, dry and supple bilateral lower extremities. Negative for open lesions or macerations bilateral.   Vascular: Dorsalis Pedis and Posterior Tibial pulses palpable bilateral.  Capillary fill time is immediate to all digits.  Neurological: Epicritic and protective threshold intact bilateral.   Musculoskeletal: Tenderness to palpation at the medial calcaneal tubercale and through the insertion of the plantar fascia of the bilateral feet. All other joints range of motion within normal limits bilateral. Strength 5/5 in all groups bilateral.   Radiographic exam: Normal osseous mineralization. Joint spaces preserved. No fracture/dislocation/boney destruction. Calcaneal spur present with mild thickening of plantar fascia bilateral. No other soft tissue abnormalities or radiopaque foreign bodies.   Assessment: 1. plantar fasciitis bilateral feet  Plan of Care:  1. Patient evaluated. Xrays reviewed.   2. Injection of 0.5cc Celestone soluspan injected into the bilateral heels.  3. Rx for Medrol Dose Pak placed 4. Rx for Diclofenac 75mg  PO BID ordered for patient. 4. Plantar fascial band(s) dispensed for bilateral plantar fasciitis. 5. Instructed patient regarding therapies and modalities at home to alleviate symptoms.  6. Return to clinic in 4 weeks.    Edrick Kins, DPM [facility]  Dr. Edrick Kins, DPM    [address], [place] [address]                Office [phone]  Fax [phone]

## 2017-05-08 ENCOUNTER — Ambulatory Visit: Payer: BLUE CROSS/BLUE SHIELD | Admitting: Podiatry

## 2017-07-02 ENCOUNTER — Other Ambulatory Visit: Payer: Self-pay | Admitting: Internal Medicine

## 2017-07-02 DIAGNOSIS — Z1231 Encounter for screening mammogram for malignant neoplasm of breast: Secondary | ICD-10-CM

## 2017-07-18 ENCOUNTER — Ambulatory Visit
Admission: RE | Admit: 2017-07-18 | Discharge: 2017-07-18 | Disposition: A | Discharge status: Home / Self Care | Payer: BLUE CROSS/BLUE SHIELD | Source: Ambulatory Visit | Attending: Internal Medicine | Admitting: Internal Medicine

## 2017-07-18 ENCOUNTER — Ambulatory Visit: Payer: BLUE CROSS/BLUE SHIELD | Admitting: Maternal Newborn

## 2017-07-18 DIAGNOSIS — Z1231 Encounter for screening mammogram for malignant neoplasm of breast: Secondary | ICD-10-CM

## 2017-07-31 ENCOUNTER — Other Ambulatory Visit: Payer: Self-pay | Admitting: *Deleted

## 2017-07-31 ENCOUNTER — Ambulatory Visit: Payer: BLUE CROSS/BLUE SHIELD | Admitting: Advanced Practice Midwife

## 2017-07-31 ENCOUNTER — Inpatient Hospital Stay
Admission: RE | Admit: 2017-07-31 | Discharge: 2017-07-31 | Disposition: A | Discharge status: Home / Self Care | Payer: Self-pay | Source: Ambulatory Visit | Attending: *Deleted | Admitting: *Deleted

## 2017-07-31 DIAGNOSIS — Z9289 Personal history of other medical treatment: Secondary | ICD-10-CM

## 2017-10-18 ENCOUNTER — Ambulatory Visit: Payer: BLUE CROSS/BLUE SHIELD | Admitting: Maternal Newborn

## 2017-10-23 ENCOUNTER — Encounter: Payer: Self-pay | Admitting: Obstetrics & Gynecology

## 2017-10-23 ENCOUNTER — Ambulatory Visit (INDEPENDENT_AMBULATORY_CARE_PROVIDER_SITE_OTHER): Payer: BLUE CROSS/BLUE SHIELD | Admitting: Obstetrics & Gynecology

## 2017-10-23 VITALS — BP 142/92 | HR 86 | Ht 65.0 in | Wt 200.0 lb

## 2017-10-23 DIAGNOSIS — N898 Other specified noninflammatory disorders of vagina: Secondary | ICD-10-CM | POA: Diagnosis not present

## 2017-10-23 DIAGNOSIS — Z780 Asymptomatic menopausal state: Secondary | ICD-10-CM | POA: Insufficient documentation

## 2017-10-23 DIAGNOSIS — Z0000 Encounter for general adult medical examination without abnormal findings: Secondary | ICD-10-CM

## 2017-10-23 DIAGNOSIS — Z202 Contact with and (suspected) exposure to infections with a predominantly sexual mode of transmission: Secondary | ICD-10-CM | POA: Diagnosis not present

## 2017-10-23 DIAGNOSIS — Z1211 Encounter for screening for malignant neoplasm of colon: Secondary | ICD-10-CM | POA: Diagnosis not present

## 2017-10-23 NOTE — Patient Instructions (Signed)
PAP every three years Mammogram every year    Call [phone] to schedule at [facility] Colonoscopy every 10 years Labs yearly (with PCP) 

## 2017-10-23 NOTE — Progress Notes (Signed)
HPI:      [name] is a 57 y.o. [phone] who LMP was in the past, she presents today for her annual examination.  The patient has complaints today of occas VAG ODOR, no discharge. The patient is sexually active.  Widow, but three partners over last year. Herlast pap: approximate date 2017 and was normal and last mammogram: approximate date 2018 and was normalThe patient does perform self breast exams.  There is no notable family history of breast or ovarian cancer in her family. The patient is no longer taking hormone replacement therapy (stopped 6 mos ago) and has min to no sx's. Patient denies post-menopausal vaginal bleeding.   The patient has regular exercise: yes. The patient denies current symptoms of depression.    GYN Hx: Last Colonoscopy:7 ago. Normal.  Last DEXA: never ago.    PMHx: Past Medical History:  Diagnosis Date  . Arthritis   . Carpal tunnel syndrome of right wrist   . Coronary atherosclerosis of unspecified type of vessel, native or graft   . Hand and foot pain   . Hyperlipidemia   . Hypertension   . MVP (mitral valve prolapse)   . Vertigo    Past Surgical History:  Procedure Laterality Date  . CARDIAC CATHETERIZATION    . CATARACT EXTRACTION W/PHACO Left 01/01/2017   Procedure: CATARACT EXTRACTION PHACO AND INTRAOCULAR LENS PLACEMENT (IOC)left Restor lens;  Surgeon: Leandrew Koyanagi, MD;  Location: [facility];  Service: Ophthalmology;  Laterality: Left;  Restor lens  . CATARACT EXTRACTION W/PHACO Right 01/31/2017   Procedure: CATARACT EXTRACTION PHACO AND INTRAOCULAR LENS PLACEMENT (IOC);  Surgeon: Leandrew Koyanagi, MD;  Location: [facility];  Service: Ophthalmology;  Laterality: Right;  . CESAREAN SECTION    . CHOLECYSTECTOMY    . HYSTEROSCOPY  06/2010   D&C  . TUBAL LIGATION     Family History  Problem Relation Age of Onset  . Hypertension Mother   . Cancer Mother 51       LUNG  . Hypertension Father    Social History     Tobacco Use  . Smoking status: Never Smoker  . Smokeless tobacco: Never Used  Substance Use Topics  . Alcohol use: Yes    Alcohol/week: 1.8 oz    Types: 3 Shots of liquor per week  . Drug use: No    Current Outpatient Medications:  .  amLODipine-benazepril (LOTREL) 10-20 MG per capsule, , Disp: , Rfl:  .  ASPIRIN 81 PO, Take by mouth daily., Disp: , Rfl:  .  diclofenac (VOLTAREN) 75 MG EC tablet, Take 1 tablet (75 mg total) by mouth 2 (two) times daily., Disp: 60 tablet, Rfl: 0 .  fluticasone (FLONASE) 50 MCG/ACT nasal spray, , Disp: , Rfl:  .  meclizine (ANTIVERT) 25 MG tablet, , Disp: , Rfl:  .  methylPREDNISolone (MEDROL DOSEPAK) 4 MG TBPK tablet, 6 day dose pack - take as directed, Disp: 21 tablet, Rfl: 0 .  omeprazole (PRILOSEC) 20 MG capsule, , Disp: , Rfl:  .  triamterene-hydrochlorothiazide (DYAZIDE) 37.5-25 MG per capsule, , Disp: , Rfl:   Current Facility-Administered Medications:  .  betamethasone acetate-betamethasone sodium phosphate (CELESTONE) injection 3 mg, 3 mg, Intramuscular, Once, Evans, Brent M, DPM Allergies: Isosorbide nitrate  Review of Systems  Constitutional: Negative for chills, fever and malaise/fatigue.  HENT: Negative for congestion, sinus pain and sore throat.   Eyes: Negative for blurred vision and pain.  Respiratory: Negative for cough and wheezing.   Cardiovascular:  Negative for chest pain and leg swelling.  Gastrointestinal: Negative for abdominal pain, constipation, diarrhea, heartburn, nausea and vomiting.  Genitourinary: Negative for dysuria, frequency, hematuria and urgency.  Musculoskeletal: Negative for back pain, joint pain, myalgias and neck pain.  Skin: Negative for itching and rash.  Neurological: Negative for dizziness, tremors and weakness.  Endo/Heme/Allergies: Does not bruise/bleed easily.  Psychiatric/Behavioral: Negative for depression. The patient is not nervous/anxious and does not have insomnia.     Objective: BP (!)  142/92   Pulse 86   Ht 5\' 5"  (1.651 m)   Wt 200 lb (90.7 kg)   BMI 33.28 kg/m   Filed Weights   10/23/17 0806  Weight: 200 lb (90.7 kg)   Body mass index is 33.28 kg/m. Physical Exam  Constitutional: She is oriented to person, place, and time. She appears well-developed and well-nourished. No distress.  Genitourinary: Rectum normal, vagina normal and uterus normal. Pelvic exam was performed with patient supine. There is no rash or lesion on the right labia. There is no rash or lesion on the left labia. Vagina exhibits no lesion. No bleeding in the vagina. Right adnexum does not display mass and does not display tenderness. Left adnexum does not display mass and does not display tenderness. Cervix does not exhibit motion tenderness, lesion, friability or polyp.   Uterus is mobile and midaxial. Uterus is not enlarged or exhibiting a mass.  HENT:  Head: Normocephalic and atraumatic. Head is without laceration.  Right Ear: Hearing normal.  Left Ear: Hearing normal.  Nose: No epistaxis.  No foreign bodies.  Mouth/Throat: Uvula is midline, oropharynx is clear and moist and mucous membranes are normal.  Eyes: Pupils are equal, round, and reactive to light.  Neck: Normal range of motion. Neck supple. No thyromegaly present.  Cardiovascular: Normal rate and regular rhythm. Exam reveals no gallop and no friction rub.  No murmur heard. Pulmonary/Chest: Effort normal and breath sounds normal. No respiratory distress. She has no wheezes. Right breast exhibits no mass, no skin change and no tenderness. Left breast exhibits no mass, no skin change and no tenderness.  Abdominal: Soft. Bowel sounds are normal. She exhibits no distension. There is no tenderness. There is no rebound.  Musculoskeletal: Normal range of motion.  Neurological: She is alert and oriented to person, place, and time. No cranial nerve deficit.  Skin: Skin is warm and dry.  Psychiatric: She has a normal mood and affect. Judgment  normal.  Vitals reviewed.  Assessment: Annual Exam 1. Annual physical exam   2. Menopause   3. STD exposure   4. Vaginal odor    Plan:            1.  Cervical Screening-  Pap smear schedule reviewed with patient  2. Breast screening- Exam annually and mammogram scheduled  3. Colonoscopy every 10 years, Hemoccult testing after age 69  4. Labs managed by PCP. STD panel per exposure risk.  5. Counseling for hormonal therapy: none    F/U  Return in about 1 year (around 10/23/2018) for Annual.  Barnett Applebaum, MD, [facility], [facility] 10/23/2017  9:04 AM

## 2017-10-25 ENCOUNTER — Encounter: Payer: Self-pay | Admitting: Obstetrics & Gynecology

## 2017-10-25 ENCOUNTER — Telehealth: Payer: Self-pay

## 2017-10-25 ENCOUNTER — Other Ambulatory Visit: Payer: Self-pay | Admitting: Obstetrics & Gynecology

## 2017-10-25 DIAGNOSIS — Z1211 Encounter for screening for malignant neoplasm of colon: Secondary | ICD-10-CM

## 2017-10-25 LAB — NUSWAB VAGINITIS PLUS (VG+)
BVAB 2: HIGH Score — AB
CANDIDA GLABRATA, NAA: NEGATIVE
CHLAMYDIA TRACHOMATIS, NAA: NEGATIVE
Candida albicans, NAA: NEGATIVE
Megasphaera 1: HIGH Score — AB
Neisseria gonorrhoeae, NAA: NEGATIVE
TRICH VAG BY NAA: NEGATIVE

## 2017-10-25 LAB — RPR+HSVIGM+HBSAG+HSV2(IGG)+...
HEP B S AG: NEGATIVE
HIV Screen 4th Generation wRfx: NONREACTIVE
HSV 2 IGG, TYPE SPEC: 12.9 {index} — AB (ref 0.00–0.90)
HSVI/II Comb IgM: <0.91 Ratio (ref 0.00–0.90)
RPR: NONREACTIVE

## 2017-10-25 NOTE — Telephone Encounter (Signed)
GI referral

## 2017-10-25 NOTE — Telephone Encounter (Signed)
Pt calling today requesting to get a referral to Dr. Tiffany Kocher at [facility]. Has been more than 10 years since she was seen there and was told she needs referral for her issues. [facility] saw her Tuesday this week.

## 2017-10-26 ENCOUNTER — Encounter: Payer: Self-pay | Admitting: Obstetrics & Gynecology

## 2017-10-27 ENCOUNTER — Other Ambulatory Visit: Payer: Self-pay | Admitting: Obstetrics & Gynecology

## 2017-10-27 MED ORDER — METRONIDAZOLE 0.75 % VA GEL: 1.0000 | Freq: Every day | VAGINAL | 0 refills | Status: AC

## 2017-10-27 NOTE — Progress Notes (Signed)
D/w pt.  Metrogel for BV.  Prior hx of HSV (oral) discussed, monitor for genital sx's.

## 2018-06-13 ENCOUNTER — Other Ambulatory Visit: Payer: Self-pay | Admitting: Family

## 2018-06-13 DIAGNOSIS — Z1231 Encounter for screening mammogram for malignant neoplasm of breast: Secondary | ICD-10-CM

## 2018-08-02 ENCOUNTER — Ambulatory Visit
Admission: RE | Admit: 2018-08-02 | Discharge: 2018-08-02 | Disposition: A | Discharge status: Home / Self Care | Payer: BLUE CROSS/BLUE SHIELD | Source: Ambulatory Visit | Attending: Family | Admitting: Family

## 2018-08-02 DIAGNOSIS — Z1231 Encounter for screening mammogram for malignant neoplasm of breast: Secondary | ICD-10-CM | POA: Insufficient documentation

## 2018-08-05 ENCOUNTER — Other Ambulatory Visit: Payer: Self-pay | Admitting: Family

## 2018-08-05 DIAGNOSIS — R928 Other abnormal and inconclusive findings on diagnostic imaging of breast: Secondary | ICD-10-CM

## 2018-08-05 DIAGNOSIS — N6310 Unspecified lump in the right breast, unspecified quadrant: Secondary | ICD-10-CM

## 2018-08-19 ENCOUNTER — Ambulatory Visit
Admission: RE | Admit: 2018-08-19 | Discharge: 2018-08-19 | Disposition: A | Discharge status: Home / Self Care | Payer: BLUE CROSS/BLUE SHIELD | Source: Ambulatory Visit | Attending: Family | Admitting: Family

## 2018-08-19 DIAGNOSIS — R928 Other abnormal and inconclusive findings on diagnostic imaging of breast: Secondary | ICD-10-CM | POA: Insufficient documentation

## 2018-08-19 DIAGNOSIS — N6310 Unspecified lump in the right breast, unspecified quadrant: Secondary | ICD-10-CM | POA: Insufficient documentation

## 2018-08-22 ENCOUNTER — Other Ambulatory Visit: Payer: Self-pay | Admitting: Family

## 2018-08-22 ENCOUNTER — Other Ambulatory Visit: Payer: Self-pay | Admitting: Nurse Practitioner

## 2018-08-22 DIAGNOSIS — R928 Other abnormal and inconclusive findings on diagnostic imaging of breast: Secondary | ICD-10-CM

## 2018-08-22 DIAGNOSIS — N6310 Unspecified lump in the right breast, unspecified quadrant: Secondary | ICD-10-CM

## 2018-08-27 ENCOUNTER — Ambulatory Visit
Admission: RE | Admit: 2018-08-27 | Discharge: 2018-08-27 | Disposition: A | Discharge status: Home / Self Care | Payer: BLUE CROSS/BLUE SHIELD | Source: Ambulatory Visit | Attending: Family | Admitting: Family

## 2018-08-27 DIAGNOSIS — R928 Other abnormal and inconclusive findings on diagnostic imaging of breast: Secondary | ICD-10-CM

## 2018-08-27 DIAGNOSIS — N6310 Unspecified lump in the right breast, unspecified quadrant: Secondary | ICD-10-CM | POA: Diagnosis present

## 2018-08-27 HISTORY — PX: BREAST BIOPSY: SHX20

## 2018-08-28 DIAGNOSIS — C439 Malignant melanoma of skin, unspecified: Secondary | ICD-10-CM

## 2018-08-28 HISTORY — DX: Malignant melanoma of skin, unspecified: C43.9

## 2018-08-30 ENCOUNTER — Other Ambulatory Visit: Payer: Self-pay | Admitting: Anatomic Pathology & Clinical Pathology

## 2018-09-02 ENCOUNTER — Encounter: Payer: Self-pay | Admitting: *Deleted

## 2018-09-02 ENCOUNTER — Other Ambulatory Visit: Payer: Self-pay | Admitting: Anatomic Pathology & Clinical Pathology

## 2018-09-02 DIAGNOSIS — C50911 Malignant neoplasm of unspecified site of right female breast: Secondary | ICD-10-CM

## 2018-09-02 LAB — SURGICAL PATHOLOGY

## 2018-09-02 NOTE — Progress Notes (Signed)
  Oncology Nurse Navigator Documentation  Navigator Location: [facility] (09/02/18 1000) Referral date to RadOnc/MedOnc: 09/10/18 (09/02/18 1000) )Navigator Encounter Type: Introductory phone call (09/02/18 1000)   Abnormal Finding Date: 08/20/19 (09/02/18 1000) Confirmed Diagnosis Date: 08/30/18 (09/02/18 1000)                   Barriers/Navigation Needs: Education;Coordination of Care (09/02/18 1000)   Interventions: Coordination of Care;Education (09/02/18 1000)   Coordination of Care: Appts (09/02/18 1000) Education Method: Verbal (09/02/18 1000)                Time Spent with Patient: 60 (09/02/18 1000)   Patient called today to establish navigation services.  States she wanted to speak with her navigator.  She was given her positive biopsy results on Friday.  Patient's mom sees Dr. Grayland Ormond and patient has requested to see him.  I have scheduled her to see Dr. Grayland Ormond on 09/10/18 @ 1:30 and Dr. Rosana Hoes for surgical consult on 1/ 9/20 @ 2:15.  Patient was informed to take a photo ID and insurance cards to her appointments.  She is to pick up her educational literature on Thursday when she see dr. Rosana Hoes.  She is to call with any questions or needs.

## 2018-09-05 ENCOUNTER — Ambulatory Visit: Payer: BLUE CROSS/BLUE SHIELD | Admitting: Surgery

## 2018-09-05 ENCOUNTER — Other Ambulatory Visit: Payer: Self-pay

## 2018-09-05 ENCOUNTER — Encounter: Payer: Self-pay | Admitting: Surgery

## 2018-09-05 ENCOUNTER — Telehealth: Payer: Self-pay | Admitting: *Deleted

## 2018-09-05 VITALS — BP 126/84 | HR 98 | Temp 97.2°F | Ht 65.0 in | Wt 212.8 lb

## 2018-09-05 DIAGNOSIS — Z170 Estrogen receptor positive status [ER+]: Secondary | ICD-10-CM

## 2018-09-05 DIAGNOSIS — N6310 Unspecified lump in the right breast, unspecified quadrant: Secondary | ICD-10-CM

## 2018-09-05 DIAGNOSIS — C50211 Malignant neoplasm of upper-inner quadrant of right female breast: Secondary | ICD-10-CM

## 2018-09-05 NOTE — Telephone Encounter (Signed)
Patient's surgery has been scheduled for 09-11-18 at [facility] with Dr. Rosana Hoes.  The patient is aware to Pre-Admit on 09-09-18 at 2:30 pm. Patient will check in at the [facility], [address] (first floor).   We will have the scheduling staff try and arrange needle loc and SLN biopsy tomorrow and call the patient will arrival time day of surgery. If this cannot be done, patient aware we will contact her on Monday.   Patient aware to contact the office should she have further questions.

## 2018-09-05 NOTE — H&P (View-Only) (Signed)
Surgical Clinic History and Physical  Referring provider:  Perrin Maltese, MD [place], [place] [address]  HISTORY OF PRESENT ILLNESS (HPI):  58 y.o. female presents for evaluation of a Right breast mass. Patient reports she is consistent with annual screening mammograms and has not appreciated any palpable mass or lymphadenopathy on self-exams. 1 month ago, however, she underwent screening mammogram, which identified a possible Right breast mass, for which diagnostic Right breast mammogram and focused ultrasound were performed, which confirmed Right breast mass without axillary lymphadenopathy. Biopsy was then subsequently performed (08/27/2018). Patient otherwise denies any breast pain, nipple discharge, fever/chills, or unintentional weight loss with no prior abnormal mammogram.  PAST MEDICAL HISTORY (PMH):  Past Medical History:  Diagnosis Date  . Arthritis   . Carpal tunnel syndrome of right wrist   . Coronary atherosclerosis of unspecified type of vessel, native or graft   . Hand and foot pain   . Hyperlipidemia   . Hypertension   . MVP (mitral valve prolapse)   . Vertigo     PAST SURGICAL HISTORY ([place]):  Past Surgical History:  Procedure Laterality Date  . BREAST BIOPSY Right 08/27/2018   [place] bx, [facility]  . CARDIAC CATHETERIZATION    . CATARACT EXTRACTION W/PHACO Left 01/01/2017   Procedure: CATARACT EXTRACTION PHACO AND INTRAOCULAR LENS PLACEMENT (IOC)left Restor lens;  Surgeon: Leandrew Koyanagi, MD;  Location: [facility];  Service: Ophthalmology;  Laterality: Left;  Restor lens  . CATARACT EXTRACTION W/PHACO Right 01/31/2017   Procedure: CATARACT EXTRACTION PHACO AND INTRAOCULAR LENS PLACEMENT (IOC);  Surgeon: Leandrew Koyanagi, MD;  Location: [facility];  Service: Ophthalmology;  Laterality: Right;  . CESAREAN SECTION    . CHOLECYSTECTOMY    . HYSTEROSCOPY  06/2010   D&C  . TUBAL LIGATION      MEDICATIONS:  Prior to Admission medications    Medication Sig Start Date End Date Taking? Authorizing Provider  amLODipine-benazepril (LOTREL) 10-20 MG per capsule  04/27/14  Yes [provider]  Calcium-Magnesium-Vitamin D (CALCIUM 500 PO) Take by mouth.   Yes [provider]  diclofenac (VOLTAREN) 75 MG EC tablet Take 1 tablet (75 mg total) by mouth 2 (two) times daily. 04/10/17  Yes Edrick Kins, DPM  fluticasone Asencion Islam) 50 MCG/ACT nasal spray  03/02/14  Yes [provider]  hydrochlorothiazide (MICROZIDE) 12.5 MG capsule Take by mouth.   Yes [provider]  meclizine (ANTIVERT) 25 MG tablet  02/14/14  Yes [provider]  nitroGLYCERIN (NITROSTAT) 0.4 MG SL tablet Place under the tongue.   Yes [provider]  omeprazole (PRILOSEC) 20 MG capsule  03/24/14  Yes [provider]  triamterene-hydrochlorothiazide (DYAZIDE) 37.5-25 MG per capsule  04/30/14  Yes [provider]  venlafaxine XR (EFFEXOR-XR) 75 MG 24 hr capsule Take by mouth.   Yes [provider]    ALLERGIES:  Allergies  Allergen Reactions  . Isosorbide Nitrate Other (See Comments)    headache    SOCIAL HISTORY:  Social History   Socioeconomic History  . Marital status: Widowed    Spouse name: Not on file  . Number of children: 3  . Years of education: 29  . Highest education level: Not on file  Occupational History  . Occupation: SELF EMPLOYED  Social Needs  . Financial resource strain: Not on file  . Food insecurity:    Worry: Not on file    Inability: Not on file  . Transportation needs:    Medical: Not  on file    Non-medical: Not on file  Tobacco Use  . Smoking status: Never Smoker  . Smokeless tobacco: Never Used  Substance and Sexual Activity  . Alcohol use: Yes    Alcohol/week: 3.0 standard drinks    Types: 3 Shots of liquor per week    Comment: social drinker  . Drug use: No  . Sexual activity: Yes    Birth control/protection: Post-menopausal  Lifestyle  .  Physical activity:    Days per week: Not on file    Minutes per session: Not on file  . Stress: Not on file  Relationships  . Social connections:    Talks on phone: Not on file    Gets together: Not on file    Attends religious service: Not on file    Active member of club or organization: Not on file    Attends meetings of clubs or organizations: Not on file    Relationship status: Not on file  . Intimate partner violence:    Fear of current or ex partner: Not on file    Emotionally abused: Not on file    Physically abused: Not on file    Forced sexual activity: Not on file  Other Topics Concern  . Not on file  Social History Narrative  . Not on file    The patient currently resides (home / rehab facility / nursing home): Home The patient normally is (ambulatory / bedbound): Ambulatory  FAMILY HISTORY:  Family History  Problem Relation Age of Onset  . Hypertension Mother   . Cancer Mother 73       LUNG  . Hypertension Father   . Breast cancer Neg Hx     Otherwise negative/non-contributory.  REVIEW OF SYSTEMS:  Constitutional: denies any other weight loss, fever, chills, or sweats  Eyes: denies any other vision changes, history of eye injury  ENT: denies sore throat, hearing problems  Respiratory: denies shortness of breath, wheezing  Cardiovascular: denies chest pain, palpitations  Gastrointestinal: denies abdominal pain, N/V, or diarrhea Musculoskeletal: denies any other joint pains or cramps  Skin: Denies any other rashes or skin discolorations Neurological: denies any other headache, dizziness, weakness  Psychiatric: Denies any other depression, anxiety   All other review of systems were otherwise negative   VITAL SIGNS:  BP 126/84   Pulse 98   Temp (!) 97.2 F (36.2 C) (Temporal)   Ht 5' 5" (1.651 m)   Wt 212 lb 12.8 oz (96.5 kg)   SpO2 96%   BMI 35.41 kg/m   PHYSICAL EXAM:  Constitutional:  -- Overweight body habitus  -- Awake, alert, and oriented  x3  Eyes:  -- Pupils equally round and reactive to light  -- No scleral icterus  Ear, nose, throat:  -- No jugular venous distension -- No nasal drainage, bleeding Pulmonary:  -- No crackles  -- Equal breath sounds bilaterally -- Breathing non-labored at rest Cardiovascular:  -- S1, S2 present  -- No pericardial rubs  Breasts: -- Right breast steri-strips remain in place without surrounding erythema or drainage -- Bilaterally non-tender to palpation, no palpable mass(es) or axillary lymphadenopathy, no nipple discharge Gastrointestinal:  -- Abdomen soft, nontender, non-distended, no guarding/rebound  -- No abdominal masses appreciated, pulsatile or otherwise  Musculoskeletal and Integumentary:  -- Wounds or skin discoloration: None appreciated except as described above (Breasts) -- Extremities: B/L UE and LE FROM, hands and feet warm, no edema  Neurologic:  -- Motor function: Intact and symmetric --  Sensation: Intact and symmetric  Labs:  CBC Latest Ref Rng & Units 01/23/2014 11/17/2013 03/01/2013  WBC 3.6 - 11.0 x10 3/mm 3 - 6.3 6.5  Hemoglobin 12.0 - 16.0 g/dL 14.1 13.2 14.0  Hematocrit 35.0 - 47.0 % 41.6 38.6 40.1  Platelets 150 - 440 x10 3/mm 3 - 319 313   CMP Latest Ref Rng & Units 01/23/2014 11/17/2013 10/21/2013  Glucose 65 - 99 mg/dL - 93 97  BUN 7 - 18 mg/dL 18 15 20(H)  Creatinine 0.60 - 1.30 mg/dL 1.06 0.91 0.88  Sodium 136 - 145 mmol/L - 137 136  Potassium 3.5 - 5.1 mmol/L 3.4(L) 3.4(L) 4.0  Chloride 98 - 107 mmol/L - 104 99  CO2 21 - 32 mmol/L - 26 25  Calcium 8.5 - 10.1 mg/dL - 9.5 9.4  Total Protein 6.4 - 8.2 g/dL - 7.5 -  Total Bilirubin 0.2 - 1.0 mg/dL - 0.3 -  Alkaline Phos Unit/L - 61 -  AST 15 - 37 Unit/L - 13(L) -  ALT 12 - 78 U/L - 18 -   Imaging studies:  Screening Mammogram (08/02/2018) In the right breast, a possible mass warrants further evaluation. In the left breast, no findings suspicious for malignancy. ACR Breast Density  Category b: There  are scattered areas of fibroglandular density.  Right Breast Diagnostic Mammogram with Focused Ultrasound (08/19/2018) Additional mammographic views of the right breast demonstrate spiculated suspicious approximately 1 cm mass in the right breast slightly upper inner quadrant, posterior depth.  Targeted ultrasound shows right breast 2 o'clock 6 cm from  the nipple hypoechoic irregular mass measuring 1.0 x 1.1 x 0.9 cm.  This finding corresponds to the mammographically seen mass. No evidence of right axillary lymphadenopathy.  Pathology: Right Breast Core Needle Biopsy (08/27/2018) A. BREAST, RIGHT AT 2:00, 6 CM FROM NIPPLE; ULTRASOUND-GUIDED CORE  BIOPSY:  - INVASIVE MAMMARY CARCINOMA, NO SPECIAL TYPE.   Size of invasive carcinoma: 10 mm in this sample  Histologic grade of invasive carcinoma: Grade 3            Glandular/tubular differentiation score: 3            Nuclear pleomorphism score: 3            Mitotic rate score: 2            Total score: 8  Ductal carcinoma in situ: Not identified  Lymphovascular invasion: Not identified   Note: Although focal histologic features aresuggestive of carcinoma  with lobular features, an immunohistochemical study for E-cadherin, with  an appropriately reactive control, is strongly and diffusely positive,  confirming ductal differentiation.  Estrogen Receptor (ER) Status: POSITIVE            Percentage of cells with nuclear positivity: > 90%            Average intensity of staining: Strong   Progesterone Receptor (PgR) Status: POSITIVE            Percentage of cells with nuclear positivity: > 90%            Average intensity of staining: Strong   HER2 (by immunohistochemistry): POSITIVE (Score 3+)            Percentage of cells with uniform intense complete  membrane staining: > 90%  Assessment/Plan:  58 y.o. 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   - results of imaging studies and pathology discussed   - all risks, benefits, and alternatives to lumpectomy (and subsequent radiation therapy) vs mastectomy, both with sentinel lymph node excisional biopsy, were discussed with the patient, all of her questions were answered to her expressed satisfaction, patient expresses she wishes to proceed with lumpectomy, and informed consent was obtained accordingly.  - will plan for Right breast lumpectomy with wire localization and Right axillary sentinel lymph node excisional biopsy as soon as possible pending anesthesia and OR availability   - anticipate return to clinic 2 weeks following above semi-elective procedures  - instructed to call if any questions or concerns  All of the above recommendations were discussed with the patient, and all of patient's questions were answered to her expressed satisfaction.  Thank you for the opportunity to participate in this patient's care.  -- Marilynne Drivers Rosana Hoes, MD, [place]: [facility] - Partnering for exceptional care. Office: [phone]

## 2018-09-05 NOTE — Patient Instructions (Addendum)
Patient is to be scheduled for surgery with Dr.Davis.   Call the office with any questions or concerns.   Lumpectomy  A lumpectomy, sometimes called a partial mastectomy, is surgery to remove a cancerous tumor or mass (the lump) from a breast. It is a form of "breast conserving" or "breast preservation" surgery. This means that the cancerous tissue is removed but the breast remains intact. During a lumpectomy, the portion of the breast that contains the tumor is removed. Some normal tissue around the lump may be taken out to make sure that all of the tumor has been removed. Lymph nodes under your arm may also be removed and tested to find out if the cancer has spread. Lymph nodes are part of the body's disease-fighting system (immune system) and are usually the first place where breast cancer spreads. Tell a health care provider about:  Any allergies you have.  All medicines you are taking, including vitamins, herbs, eye drops, creams, and over-the-counter medicines.  Any problems you or family members have had with anesthetic medicines.  Any blood disorders you have.  Any surgeries you have had.  Any medical conditions you have.  Whether you are pregnant or may be pregnant. What are the risks? Generally, this is a safe procedure. However, problems may occur, including:  Bleeding.  Infection.  Allergic reaction to medicines.  Pain, swelling, weakness, or numbness in the arm on the side of your surgery.  Temporary swelling.  Change in the shape of the breast, particularly if a large portion is removed.  Scar tissue that forms at the surgical site and feels hard to the touch. What happens before the procedure? Staying hydrated Follow instructions from your health care provider about hydration, which may include:  Up to 2 hours before the procedure - you may continue to drink clear liquids, such as water, clear fruit juice, black coffee, and plain tea.  Eating and drinking  restrictions  Follow instructions from your health care provider about eating and drinking, which may include: ? 8 hours before the procedure - stop eating heavy meals or foods such as meat, fried foods, or fatty foods. ? 6 hours before the procedure - stop eating light meals or foods, such as toast or cereal. ? 6 hours before the procedure - stop drinking milk or drinks that contain milk. ? 2 hours before the procedure - stop drinking clear liquids. Medicines  Ask your health care provider about: ? Changing or stopping your regular medicines. This is especially important if you are taking diabetes medicines or blood thinners. ? Taking medicines such as aspirin and ibuprofen. These medicines can thin your blood. Do not take these medicines before your procedure if your health care provider instructs you not to.  You may be given antibiotic medicine to help prevent infection. General instructions  Prior to surgery, your health care provider may do a procedure to locate and mark the tumor area in your breast (localization). This procedure will help guide your surgeon to where the incision will be made. This may be done with: ? Imaging. This may include a mammogram, ultrasound, or MRI. ? Insertion of a small wire, clip, seed, or radar reflector implant.  You may be screened for extra fluid around the lymph nodes (lymphedema).  Ask your health care provider how your surgical site will be marked or identified.  Plan to have someone take you home from the hospital or clinic. What happens during the procedure?   To lower your risk  of infection: ? Your health care team will wash or sanitize their hands. ? Your skin will be washed with soap.  An IV will be inserted into one of your veins.  You will be given one or more of the following: ? A medicine to help you relax (sedative). ? A medicine to numb the area (local anesthetic). ? A medicine to make you fall asleep (general  anesthetic).  Your health care provider will use a kind of electric scalpel that uses heat to minimize bleeding (electrocautery knife). A curved incision that follows the natural curve of your breast will be made. This type of incision will allow for minimal scarring and better healing.  The tumor will be removed along with some of the surrounding tissue. This will be sent to the lab for testing. Your health care provider may also remove lymph nodes at this time if needed.  If the tumor is close to the muscles over your chest, some muscle tissue may also be removed.  A small drain tube may be inserted into your breast area or armpit to collect fluid that may build up after surgery. This tube will be connected to a suction bulb on the outside of your body to remove the fluid.  The incision will be closed with stitches (sutures).  A bandage (dressing) may be placed over the incision. The procedure may vary among health care providers and hospitals. What happens after the procedure?  Your blood pressure, heart rate, breathing rate, and blood oxygen level will be monitored until you leave the hospital or clinic.  You will be given medicine for pain as needed.  Your IV will be removed when you are able to eat and drink by mouth.  You will be encouraged to get up and walk as soon as you can. This is important to improve blood flow and breathing. Ask for help if you feel weak or unsteady.  You may have a drain tube in place for 2-3 days to prevent a collection of blood (hematoma) from developing in the breast. You will be given instructions about caring for the drain before you go home.  A pressure bandage may be applied for 1-2 days to prevent bleeding or swelling. Your pressure bandage may look like a thick piece of fabric or an elastic wrap. Ask your health care provider how to care for your bandage at home.  You may be given a tight sleeve to wear over your arm on the side of your surgery.  You should wear this sleeve as told by your health care provider.  Do not drive for 24 hours if you were given a sedative during your procedure. Summary  A lumpectomy, sometimes called a partial mastectomy, is surgery to remove a cancerous tumor or mass (the lump) from a breast.  During a lumpectomy, the portion of the breast that contains the tumor is removed. Some normal tissue around the lump may be taken out to make sure that all of the tumor has been removed. Lymph nodes under your arm may also be removed and tested to find out if the cancer has spread.  You may have a drain tube in place for 2-3 days to prevent a collection of blood (hematoma) from developing in the breast. You will be given instructions about caring for the drain before you go home.  Plan to have someone take you home from the hospital or clinic. This information is not intended to replace advice given to you by your  health care provider. Make sure you discuss any questions you have with your health care provider. Document Released: 09/25/2006 Document Revised: 02/12/2018 Document Reviewed: 04/26/2016 Elsevier Interactive Patient Education  2019 [employer].

## 2018-09-05 NOTE — Progress Notes (Signed)
Surgical Clinic History and Physical  Referring provider:  Khan, Neelam S, MD [address] [place], [place] [address]  HISTORY OF PRESENT ILLNESS (HPI):  57 y.o. female presents for evaluation of a Right breast mass. Patient reports she is consistent with annual screening mammograms and has not appreciated any palpable mass or lymphadenopathy on self-exams. 1 month ago, however, she underwent screening mammogram, which identified a possible Right breast mass, for which diagnostic Right breast mammogram and focused ultrasound were performed, which confirmed Right breast mass without axillary lymphadenopathy. Biopsy was then subsequently performed (08/27/2018). Patient otherwise denies any breast pain, nipple discharge, fever/chills, or unintentional weight loss with no prior abnormal mammogram.  PAST MEDICAL HISTORY (PMH):  Past Medical History:  Diagnosis Date  . Arthritis   . Carpal tunnel syndrome of right wrist   . Coronary atherosclerosis of unspecified type of vessel, native or graft   . Hand and foot pain   . Hyperlipidemia   . Hypertension   . MVP (mitral valve prolapse)   . Vertigo     PAST SURGICAL HISTORY (PSH):  Past Surgical History:  Procedure Laterality Date  . BREAST BIOPSY Right 08/27/2018   us bx, [facility]  . CARDIAC CATHETERIZATION    . CATARACT EXTRACTION W/PHACO Left 01/01/2017   Procedure: CATARACT EXTRACTION PHACO AND INTRAOCULAR LENS PLACEMENT (IOC)left Restor lens;  Surgeon: Brasington, Chadwick, MD;  Location: [facility];  Service: Ophthalmology;  Laterality: Left;  Restor lens  . CATARACT EXTRACTION W/PHACO Right 01/31/2017   Procedure: CATARACT EXTRACTION PHACO AND INTRAOCULAR LENS PLACEMENT (IOC);  Surgeon: Brasington, Chadwick, MD;  Location: [facility];  Service: Ophthalmology;  Laterality: Right;  . CESAREAN SECTION    . CHOLECYSTECTOMY    . HYSTEROSCOPY  06/2010   D&C  . TUBAL LIGATION      MEDICATIONS:  Prior to Admission medications    Medication Sig Start Date End Date Taking? Authorizing Provider  amLODipine-benazepril (LOTREL) 10-20 MG per capsule  04/27/14  Yes [provider]  Calcium-Magnesium-Vitamin D (CALCIUM 500 PO) Take by mouth.   Yes [provider]  diclofenac (VOLTAREN) 75 MG EC tablet Take 1 tablet (75 mg total) by mouth 2 (two) times daily. 04/10/17  Yes Evans, Brent M, DPM  fluticasone (FLONASE) 50 MCG/ACT nasal spray  03/02/14  Yes [provider]  hydrochlorothiazide (MICROZIDE) 12.5 MG capsule Take by mouth.   Yes [provider]  meclizine (ANTIVERT) 25 MG tablet  02/14/14  Yes [provider]  nitroGLYCERIN (NITROSTAT) 0.4 MG SL tablet Place under the tongue.   Yes [provider]  omeprazole (PRILOSEC) 20 MG capsule  03/24/14  Yes [provider]  triamterene-hydrochlorothiazide (DYAZIDE) 37.5-25 MG per capsule  04/30/14  Yes [provider]  venlafaxine XR (EFFEXOR-XR) 75 MG 24 hr capsule Take by mouth.   Yes [provider]    ALLERGIES:  Allergies  Allergen Reactions  . Isosorbide Nitrate Other (See Comments)    headache    SOCIAL HISTORY:  Social History   Socioeconomic History  . Marital status: Widowed    Spouse name: Not on file  . Number of children: 3  . Years of education: 12  . Highest education level: Not on file  Occupational History  . Occupation: SELF EMPLOYED  Social Needs  . Financial resource strain: Not on file  . Food insecurity:    Worry: Not on file    Inability: Not on file  . Transportation needs:    Medical: Not   on file    Non-medical: Not on file  Tobacco Use  . Smoking status: Never Smoker  . Smokeless tobacco: Never Used  Substance and Sexual Activity  . Alcohol use: Yes    Alcohol/week: 3.0 standard drinks    Types: 3 Shots of liquor per week    Comment: social drinker  . Drug use: No  . Sexual activity: Yes    Birth control/protection: Post-menopausal  Lifestyle  .  Physical activity:    Days per week: Not on file    Minutes per session: Not on file  . Stress: Not on file  Relationships  . Social connections:    Talks on phone: Not on file    Gets together: Not on file    Attends religious service: Not on file    Active member of club or organization: Not on file    Attends meetings of clubs or organizations: Not on file    Relationship status: Not on file  . Intimate partner violence:    Fear of current or ex partner: Not on file    Emotionally abused: Not on file    Physically abused: Not on file    Forced sexual activity: Not on file  Other Topics Concern  . Not on file  Social History Narrative  . Not on file    The patient currently resides (home / rehab facility / nursing home): Home The patient normally is (ambulatory / bedbound): Ambulatory  FAMILY HISTORY:  Family History  Problem Relation Age of Onset  . Hypertension Mother   . Cancer Mother 75       LUNG  . Hypertension Father   . Breast cancer Neg Hx     Otherwise negative/non-contributory.  REVIEW OF SYSTEMS:  Constitutional: denies any other weight loss, fever, chills, or sweats  Eyes: denies any other vision changes, history of eye injury  ENT: denies sore throat, hearing problems  Respiratory: denies shortness of breath, wheezing  Cardiovascular: denies chest pain, palpitations  Gastrointestinal: denies abdominal pain, N/V, or diarrhea Musculoskeletal: denies any other joint pains or cramps  Skin: Denies any other rashes or skin discolorations Neurological: denies any other headache, dizziness, weakness  Psychiatric: Denies any other depression, anxiety   All other review of systems were otherwise negative   VITAL SIGNS:  BP 126/84   Pulse 98   Temp (!) 97.2 F (36.2 C) (Temporal)   Ht 5' 5" (1.651 m)   Wt 212 lb 12.8 oz (96.5 kg)   SpO2 96%   BMI 35.41 kg/m   PHYSICAL EXAM:  Constitutional:  -- Overweight body habitus  -- Awake, alert, and oriented  x3  Eyes:  -- Pupils equally round and reactive to light  -- No scleral icterus  Ear, nose, throat:  -- No jugular venous distension -- No nasal drainage, bleeding Pulmonary:  -- No crackles  -- Equal breath sounds bilaterally -- Breathing non-labored at rest Cardiovascular:  -- S1, S2 present  -- No pericardial rubs  Breasts: -- Right breast steri-strips remain in place without surrounding erythema or drainage -- Bilaterally non-tender to palpation, no palpable mass(es) or axillary lymphadenopathy, no nipple discharge Gastrointestinal:  -- Abdomen soft, nontender, non-distended, no guarding/rebound  -- No abdominal masses appreciated, pulsatile or otherwise  Musculoskeletal and Integumentary:  -- Wounds or skin discoloration: None appreciated except as described above (Breasts) -- Extremities: B/L UE and LE FROM, hands and feet warm, no edema  Neurologic:  -- Motor function: Intact and symmetric --   Sensation: Intact and symmetric  Labs:  CBC Latest Ref Rng & Units 01/23/2014 11/17/2013 03/01/2013  WBC 3.6 - 11.0 x10 3/mm 3 - 6.3 6.5  Hemoglobin 12.0 - 16.0 g/dL 14.1 13.2 14.0  Hematocrit 35.0 - 47.0 % 41.6 38.6 40.1  Platelets 150 - 440 x10 3/mm 3 - 319 313   CMP Latest Ref Rng & Units 01/23/2014 11/17/2013 10/21/2013  Glucose 65 - 99 mg/dL - 93 97  BUN 7 - 18 mg/dL 18 15 20(H)  Creatinine 0.60 - 1.30 mg/dL 1.06 0.91 0.88  Sodium 136 - 145 mmol/L - 137 136  Potassium 3.5 - 5.1 mmol/L 3.4(L) 3.4(L) 4.0  Chloride 98 - 107 mmol/L - 104 99  CO2 21 - 32 mmol/L - 26 25  Calcium 8.5 - 10.1 mg/dL - 9.5 9.4  Total Protein 6.4 - 8.2 g/dL - 7.5 -  Total Bilirubin 0.2 - 1.0 mg/dL - 0.3 -  Alkaline Phos Unit/L - 61 -  AST 15 - 37 Unit/L - 13(L) -  ALT 12 - 78 U/L - 18 -   Imaging studies:  Screening Mammogram (08/02/2018) In the right breast, a possible mass warrants further evaluation. In the left breast, no findings suspicious for malignancy. ACR Breast Density  Category b: There  are scattered areas of fibroglandular density.  Right Breast Diagnostic Mammogram with Focused Ultrasound (08/19/2018) Additional mammographic views of the right breast demonstrate spiculated suspicious approximately 1 cm mass in the right breast slightly upper inner quadrant, posterior depth.  Targeted ultrasound shows right breast 2 o'clock 6 cm from  the nipple hypoechoic irregular mass measuring 1.0 x 1.1 x 0.9 cm.  This finding corresponds to the mammographically seen mass. No evidence of right axillary lymphadenopathy.  Pathology: Right Breast Core Needle Biopsy (08/27/2018) A. BREAST, RIGHT AT 2:00, 6 CM FROM NIPPLE; ULTRASOUND-GUIDED CORE  BIOPSY:  - INVASIVE MAMMARY CARCINOMA, NO SPECIAL TYPE.   Size of invasive carcinoma: 10 mm in this sample  Histologic grade of invasive carcinoma: Grade 3            Glandular/tubular differentiation score: 3            Nuclear pleomorphism score: 3            Mitotic rate score: 2            Total score: 8  Ductal carcinoma in situ: Not identified  Lymphovascular invasion: Not identified   Note: Although focal histologic features aresuggestive of carcinoma  with lobular features, an immunohistochemical study for E-cadherin, with  an appropriately reactive control, is strongly and diffusely positive,  confirming ductal differentiation.  Estrogen Receptor (ER) Status: POSITIVE            Percentage of cells with nuclear positivity: > 90%            Average intensity of staining: Strong   Progesterone Receptor (PgR) Status: POSITIVE            Percentage of cells with nuclear positivity: > 90%            Average intensity of staining: Strong   HER2 (by immunohistochemistry): POSITIVE (Score 3+)            Percentage of cells with uniform intense complete  membrane staining: > 90%  Assessment/Plan:  58 y.o. 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   - results of imaging studies and pathology discussed   - all risks, benefits, and alternatives to lumpectomy (and subsequent radiation therapy) vs mastectomy, both with sentinel lymph node excisional biopsy, were discussed with the patient, all of her questions were answered to her expressed satisfaction, patient expresses she wishes to proceed with lumpectomy, and informed consent was obtained accordingly.  - will plan for Right breast lumpectomy with wire localization and Right axillary sentinel lymph node excisional biopsy as soon as possible pending anesthesia and OR availability   - anticipate return to clinic 2 weeks following above semi-elective procedures  - instructed to call if any questions or concerns  All of the above recommendations were discussed with the patient, and all of patient's questions were answered to her expressed satisfaction.  Thank you for the opportunity to participate in this patient's care.  -- Malina Geers E. Bryn Perkin, MD, RPVI [place]: [facility] General Surgery - Partnering for exceptional care. Office: [phone] 

## 2018-09-06 ENCOUNTER — Telehealth: Payer: Self-pay | Admitting: Surgery

## 2018-09-06 ENCOUNTER — Other Ambulatory Visit: Payer: Self-pay | Admitting: Surgery

## 2018-09-06 DIAGNOSIS — C50211 Malignant neoplasm of upper-inner quadrant of right female breast: Secondary | ICD-10-CM

## 2018-09-06 DIAGNOSIS — Z170 Estrogen receptor positive status [ER+]: Principal | ICD-10-CM

## 2018-09-06 NOTE — Telephone Encounter (Signed)
The patient is to report to the [facility] at [facility] on 09/11/2018 at 7:45 am. She is aware of date, time, and location.

## 2018-09-06 NOTE — Telephone Encounter (Signed)
Patient is calling and said its her best interest for her not to go on her cruise and is asking if you can give her a letter stating she is having surgery and will need to take this time to recover and is asking if you would be able to give her a letter, patient stated if that's something you would be able to do, that way she may be able to get her money back from the cruise. Please call patient and advise.

## 2018-09-06 NOTE — Progress Notes (Signed)
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  INTERVAL HISTORY: Patient is a 58 year old female who was noted to have an abnormality on routine screening mammogram.  Subsequent ultrasound and biopsy revealed the above-stated breast cancer.  She is anxious, but otherwise feels well.  She has no neurologic complaints.  She denies any recent fevers or illnesses.  She has a good appetite and denies weight loss.  She has no chest pain or shortness she denies any nausea, vomiting, constipation, or diarrhea.  She has no urinary complaints.  Patient feels at her baseline offers no further specific complaints today.  REVIEW OF SYSTEMS:   Review of Systems  Constitutional: Negative.  Negative for fever, malaise/fatigue and weight loss.  Respiratory: Negative.  Negative for cough, hemoptysis and shortness of breath.   Cardiovascular: Negative.  Negative for chest pain and leg swelling.  Gastrointestinal: Negative.  Negative for abdominal pain, blood in stool and melena.  Genitourinary: Negative.  Negative for dysuria.  Musculoskeletal: Negative.  Negative for back pain.  Skin: Negative.  Negative for rash.  Neurological: Negative.  Negative for seizures, weakness and headaches.  Psychiatric/Behavioral: The patient is nervous/anxious.     As per HPI. Otherwise, a complete review of systems is negative.  PAST MEDICAL HISTORY: Past Medical History:  Diagnosis Date  . Anginal pain ([place])   . Arthritis   . Carpal tunnel syndrome of right wrist   . Coronary atherosclerosis of unspecified type of vessel, native or graft   . Hand and  foot pain   . Hyperlipidemia   . Hypertension   . MVP (mitral valve prolapse)   . Sleep apnea   . Vertigo     PAST SURGICAL HISTORY: Past Surgical History:  Procedure Laterality Date  . BREAST BIOPSY Right 08/27/2018   [place] bx, IMC  . CARDIAC CATHETERIZATION    . CATARACT EXTRACTION W/PHACO Left 01/01/2017   Procedure: CATARACT EXTRACTION PHACO AND INTRAOCULAR LENS PLACEMENT (IOC)left Restor lens;  Surgeon: Leandrew Koyanagi, MD;  Location: [facility];  Service: Ophthalmology;  Laterality: Left;  Restor lens  . CATARACT EXTRACTION W/PHACO Right 01/31/2017   Procedure: CATARACT EXTRACTION PHACO AND INTRAOCULAR LENS PLACEMENT (IOC);  Surgeon: Leandrew Koyanagi, MD;  Location: [facility];  Service: Ophthalmology;  Laterality: Right;  . CESAREAN SECTION    . CHOLECYSTECTOMY    . HYSTEROSCOPY  06/2010   D&C  . TUBAL LIGATION      FAMILY HISTORY: Family History  Problem Relation Age of Onset  . Hypertension Mother   . Cancer Mother 41       LUNG  . Hypertension Father   . Breast cancer Neg Hx     ADVANCED DIRECTIVES (Y/N):  N  HEALTH MAINTENANCE: Social History   Tobacco Use  . Smoking status: Never Smoker  . Smokeless tobacco: Never Used  Substance Use Topics  . Alcohol use: Yes    Alcohol/week: 3.0 standard drinks    Types: 3 Shots of liquor per week    Comment: social drinker  . Drug use: No     Colonoscopy:  PAP:  Bone density:  Lipid panel:  Allergies  Allergen Reactions  . Isosorbide  Nitrate Other (See Comments)    headache    Current Outpatient Medications  Medication Sig Dispense Refill  . amLODipine-benazepril (LOTREL) 10-20 MG per capsule Take 1 capsule by mouth daily.     . diclofenac (VOLTAREN) 75 MG EC tablet Take 1 tablet (75 mg total) by mouth 2 (two) times daily. 60 tablet 0  . fluticasone (FLONASE) 50 MCG/ACT nasal spray Place 2 sprays into both nostrils daily as needed for allergies.     . hydrochlorothiazide (MICROZIDE)  12.5 MG capsule Take 12.5 mg by mouth daily as needed (vertigo).     . LORazepam (ATIVAN) 1 MG tablet Take 1 mg by mouth at bedtime as needed for anxiety or sleep.    . meclizine (ANTIVERT) 25 MG tablet Take 25 mg by mouth 3 (three) times daily as needed (Vertigo).     . nitroGLYCERIN (NITROSTAT) 0.4 MG SL tablet Place 0.4 mg under the tongue every 5 (five) minutes as needed for chest pain.     . NON FORMULARY Inhale 1 application into the lungs at bedtime. CPAP    . omeprazole (PRILOSEC) 20 MG capsule Take 20 mg by mouth daily.     Marland Kitchen venlafaxine XR (EFFEXOR-XR) 75 MG 24 hr capsule Take 75 mg by mouth daily with breakfast.      Current Facility-Administered Medications  Medication Dose Route Frequency Provider Last Rate Last Dose  . betamethasone acetate-betamethasone sodium phosphate (CELESTONE) injection 3 mg  3 mg Intramuscular Once Edrick Kins, DPM       Facility-Administered Medications Ordered in Other Visits  Medication Dose Route Frequency Provider Last Rate Last Dose  . ceFAZolin (ANCEF) IVPB 2g/100 mL premix  2 g Intravenous On Call to OR [name], MD        OBJECTIVE: Vitals:   09/10/18 1336  BP: (!) 154/89  Pulse: 80  Temp: 97.8 F (36.6 C)     Body mass index is 35.76 kg/m.    ECOG FS:0 - Asymptomatic  General: Well-developed, well-nourished, no acute distress. Eyes: Pink conjunctiva, anicteric sclera. HEENT: Normocephalic, moist mucous membranes, clear oropharnyx. Breasts: Patient request exam be deferred today. Lungs: Clear to auscultation bilaterally. Heart: Regular rate and rhythm. No rubs, murmurs, or gallops. Abdomen: Soft, nontender, nondistended. No organomegaly noted, normoactive bowel sounds. Musculoskeletal: No edema, cyanosis, or clubbing. Neuro: Alert, answering all questions appropriately. Cranial nerves grossly intact. Skin: No rashes or petechiae noted. Psych: Normal affect. Lymphatics: No cervical, calvicular, axillary or inguinal  LAD.   LAB RESULTS:  Lab Results  Component Value Date   NA 141 09/09/2018   K 3.8 09/09/2018   CL 109 09/09/2018   CO2 26 09/09/2018   GLUCOSE 107 (H) 09/09/2018   BUN 14 09/09/2018   CREATININE 0.75 09/09/2018   CALCIUM 9.6 09/09/2018   PROT 7.5 11/17/2013   ALBUMIN 3.6 11/17/2013   AST 13 (L) 11/17/2013   ALT 18 11/17/2013   ALKPHOS 61 11/17/2013   BILITOT 0.3 11/17/2013   GFRNONAA >60 09/09/2018   GFRAA >60 09/09/2018    Lab Results  Component Value Date   WBC 6.3 09/09/2018   NEUTROABS 3.1 09/09/2018   HGB 13.4 09/09/2018   HCT 41.4 09/09/2018   MCV 94.1 09/09/2018   PLT 380 09/09/2018     STUDIES: US Breast Ltd Uni Right Inc Axilla  Result Date: 08/19/2018 CLINICAL DATA:  Right breast slightly upper inner quadrant possible mass seen on screening mammography. EXAM: DIGITAL DIAGNOSTIC RIGHT MAMMOGRAM WITH CAD AND TOMO ULTRASOUND  RIGHT BREAST COMPARISON:  Previous exam(s). ACR Breast Density Category c: The breast tissue is heterogeneously dense, which may obscure small masses. FINDINGS: Additional mammographic views of the right breast demonstrate spiculated suspicious approximately 1 cm mass in the right breast slightly upper inner quadrant, posterior depth. Mammographic images were processed with CAD. On physical exam, approximately 1 cm moderately firm mass in the right breast upper inner quadrant, middle depth. Targeted ultrasound is performed, showing right breast 2 o'clock 6 cm from the nipple hypoechoic irregular mass measuring 1.0 x 1.1 x 0.9 cm. This finding corresponds to the mammographically seen mass. No evidence of right axillary lymphadenopathy. IMPRESSION: Right breast 2 o'clock 1.1 cm suspicious mass, for which ultrasound-guided core needle biopsy is recommended. No evidence of right axillary lymphadenopathy. RECOMMENDATION: Ultrasound-guided core needle biopsy of the right breast. I have discussed the findings and recommendations with the patient. Results  were also provided in writing at the conclusion of the visit. If applicable, a reminder letter will be sent to the patient regarding the next appointment. BI-RADS CATEGORY  4: Suspicious. Electronically Signed   By: Fidela Salisbury M.D.   On: 08/19/2018 10:26   Mm Diag Breast Tomo Uni Right  Result Date: 08/19/2018 CLINICAL DATA:  Right breast slightly upper inner quadrant possible mass seen on screening mammography. EXAM: DIGITAL DIAGNOSTIC RIGHT MAMMOGRAM WITH CAD AND TOMO ULTRASOUND RIGHT BREAST COMPARISON:  Previous exam(s). ACR Breast Density Category c: The breast tissue is heterogeneously dense, which may obscure small masses. FINDINGS: Additional mammographic views of the right breast demonstrate spiculated suspicious approximately 1 cm mass in the right breast slightly upper inner quadrant, posterior depth. Mammographic images were processed with CAD. On physical exam, approximately 1 cm moderately firm mass in the right breast upper inner quadrant, middle depth. Targeted ultrasound is performed, showing right breast 2 o'clock 6 cm from the nipple hypoechoic irregular mass measuring 1.0 x 1.1 x 0.9 cm. This finding corresponds to the mammographically seen mass. No evidence of right axillary lymphadenopathy. IMPRESSION: Right breast 2 o'clock 1.1 cm suspicious mass, for which ultrasound-guided core needle biopsy is recommended. No evidence of right axillary lymphadenopathy. RECOMMENDATION: Ultrasound-guided core needle biopsy of the right breast. I have discussed the findings and recommendations with the patient. Results were also provided in writing at the conclusion of the visit. If applicable, a reminder letter will be sent to the patient regarding the next appointment. BI-RADS CATEGORY  4: Suspicious. Electronically Signed   By: Fidela Salisbury M.D.   On: 08/19/2018 10:26   Mm Clip Placement Right  Result Date: 08/27/2018 CLINICAL DATA:  Status post ultrasound-guided core needle biopsy  of a 1.1 cm mass in the 2 o'clock position of the right breast. EXAM: DIAGNOSTIC RIGHT MAMMOGRAM POST ULTRASOUND BIOPSY COMPARISON:  Previous exam(s). FINDINGS: Mammographic images were obtained following ultrasound guided biopsy of the recently demonstrated 1.1 cm mass in the 2 o'clock position of the right breast. These demonstrate a Venus shaped biopsy marker clip at the expected location of the biopsied mass. IMPRESSION: Appropriate clip deployment following right breast ultrasound-guided core needle biopsy. Final Assessment: Post Procedure Mammograms for Marker Placement Electronically Signed   By: Claudie Revering M.D.   On: 08/27/2018 09:06   [place] Rt Breast Bx W Loc Dev 1st Lesion Img Bx Spec US Guide  Addendum Date: 08/30/2018   ADDENDUM REPORT: 08/30/2018 12:11 ADDENDUM: PATHOLOGY ADDENDUM: Pathology: Invasive mammary carcinoma, grade 3, no special type. Pathology concordant with imaging findings: Yes Recommendation: Surgical referral Localization/excision considerations:  Preoperative wire localization. At the request of the patient, I spoke with her by telephone on 08/30/2018 at 12:10 p.m. She reports doing well after the biopsy. The findings and recommendations were discussed with the patient. Electronically Signed   By: Fidela Salisbury M.D.   On: 08/30/2018 12:11   Result Date: 08/30/2018 CLINICAL DATA:  1.1 cm mass suspicious for malignancy in the 2 o'clock position of the right breast at recent mammography and ultrasound. EXAM: ULTRASOUND GUIDED RIGHT BREAST CORE NEEDLE BIOPSY COMPARISON:  Previous exam(s). FINDINGS: I met with the patient and we discussed the procedure of ultrasound-guided biopsy, including benefits and alternatives. We discussed the high likelihood of a successful procedure. We discussed the risks of the procedure, including infection, bleeding, tissue injury, clip migration, and inadequate sampling. Informed written consent was given. The usual time-out protocol was performed  immediately prior to the procedure. Lesion quadrant: Upper inner quadrant Using sterile technique and 1% Lidocaine as local anesthetic, under direct ultrasound visualization, a 12 gauge spring-loaded device was used to perform biopsy of the recently demonstrated 1.1 cm mass in the 2 o'clock position of the right breast using a caudal approach. At the conclusion of the procedure a venus shaped tissue marker clip was deployed into the biopsy cavity. Follow up 2 view mammogram was performed and dictated separately. IMPRESSION: Ultrasound guided biopsy of the recently demonstrated 1.1 cm mass in the 2 o'clock position of the right breast. No apparent complications. Electronically Signed: By: Claudie Revering M.D. 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  She will also require port placement at this time.  Given the fact the patient has HER-2 positive disease, she will require adjuvant chemotherapy with 12 weekly cycles of Taxol and Herceptin followed by maintenance Herceptin every 3 weeks for 12 months.  At the conclusion of her Taxol, patient will benefit from adjuvant XRT.  Patient will also benefit from an aromatase inhibitor for 5 years.  Will get MUGA scan in the next several weeks prior to initiating Herceptin.  Return to clinic in 2 weeks to discuss her final pathology results and treatment planning.  I spent a total of 60 minutes face-to-face with the patient of which greater than 50% of the visit was spent in counseling and coordination of care as detailed above.   Patient expressed understanding and was in agreement with this plan. She also understands that She can call clinic at any time with any questions, concerns, or  complaints.   Cancer Staging Malignant neoplasm of upper-inner quadrant of right breast in female, estrogen receptor positive ([place]) Staging form: Breast, AJCC 8th Edition - Clinical stage from 09/06/2018: Stage IA (cT1c, cN0, cM0, G3, ER+, PR+, HER2+) - Signed by Lloyd Huger, MD on 09/06/2018   Lloyd Huger, MD   09/11/2018 6:22 AM

## 2018-09-09 ENCOUNTER — Encounter
Admission: RE | Admit: 2018-09-09 | Discharge: 2018-09-09 | Disposition: A | Discharge status: Home / Self Care | Payer: BLUE CROSS/BLUE SHIELD | Source: Ambulatory Visit | Attending: Surgery | Admitting: Surgery

## 2018-09-09 ENCOUNTER — Encounter: Payer: Self-pay | Admitting: *Deleted

## 2018-09-09 ENCOUNTER — Other Ambulatory Visit: Payer: Self-pay

## 2018-09-09 DIAGNOSIS — I10 Essential (primary) hypertension: Secondary | ICD-10-CM | POA: Diagnosis not present

## 2018-09-09 DIAGNOSIS — I2510 Atherosclerotic heart disease of native coronary artery without angina pectoris: Secondary | ICD-10-CM | POA: Diagnosis not present

## 2018-09-09 DIAGNOSIS — Z01818 Encounter for other preprocedural examination: Secondary | ICD-10-CM | POA: Insufficient documentation

## 2018-09-09 HISTORY — DX: Sleep apnea, unspecified: G47.30

## 2018-09-09 HISTORY — DX: Angina pectoris, unspecified: I20.9

## 2018-09-09 LAB — BASIC METABOLIC PANEL
Anion gap: 6 (ref 5–15)
BUN: 14 mg/dL (ref 6–20)
CO2: 26 mmol/L (ref 22–32)
Calcium: 9.6 mg/dL (ref 8.9–10.3)
Chloride: 109 mmol/L (ref 98–111)
Creatinine, Ser: 0.75 mg/dL (ref 0.44–1.00)
GFR calc Af Amer: >60 mL/min (ref >60)
GFR calc non Af Amer: >60 mL/min (ref >60)
Glucose, Bld: 107 mg/dL — ABNORMAL HIGH (ref 70–99)
POTASSIUM: 3.8 mmol/L (ref 3.5–5.1)
Sodium: 141 mmol/L (ref 135–145)

## 2018-09-09 LAB — CBC WITH DIFFERENTIAL/PLATELET
Abs Immature Granulocytes: 0.01 10*3/uL (ref 0.00–0.07)
BASOS ABS: 0.1 10*3/uL (ref 0.0–0.1)
Basophils Relative: 1 %
Eosinophils Absolute: 0.1 10*3/uL (ref 0.0–0.5)
Eosinophils Relative: 2 %
HCT: 41.4 % (ref 36.0–46.0)
Hemoglobin: 13.4 g/dL (ref 12.0–15.0)
Immature Granulocytes: 0 %
Lymphocytes Relative: 34 %
Lymphs Abs: 2.2 10*3/uL (ref 0.7–4.0)
MCH: 30.5 pg (ref 26.0–34.0)
MCHC: 32.4 g/dL (ref 30.0–36.0)
MCV: 94.1 fL (ref 80.0–100.0)
Monocytes Absolute: 0.8 10*3/uL (ref 0.1–1.0)
Monocytes Relative: 13 %
NRBC: 0 % (ref 0.0–0.2)
Neutro Abs: 3.1 10*3/uL (ref 1.7–7.7)
Neutrophils Relative %: 50 %
PLATELETS: 380 10*3/uL (ref 150–400)
RBC: 4.4 MIL/uL (ref 3.87–5.11)
RDW: 12.7 % (ref 11.5–15.5)
WBC: 6.3 10*3/uL (ref 4.0–10.5)

## 2018-09-09 NOTE — Patient Instructions (Signed)
  Your procedure is scheduled on: Wednesday September 11, 2018 Report to [facility] @ 7:45 am  Remember: Instructions that are not followed completely may result in serious medical risk, up to and including death, or upon the discretion of your surgeon and anesthesiologist your surgery may need to be rescheduled.    __x__ 1. Do not eat food (including mints, candies, chewing gum) after midnight the night before your procedure. You may drink clear liquids up to 2 hours before you are scheduled to arrive at the hospital for your procedure.  Do not drink anything within 2 hours of your scheduled arrival to the hospital.  Approved clear liquids:  --Water or Apple juice without pulp  --Clear carbohydrate beverage such as Gatorade or Powerade  --Black Coffee or Clear Tea (No milk, no creamers, do not add anything to the coffee or tea)    __x__ 2. No Alcohol for 24 hours before or after surgery.   __x__ 3. No Smoking or e-cigarettes for 24 hours before surgery.  Do not use any chewable tobacco products for at least 6 hours before surgery.   __x__ 4. Notify your doctor if there is any change in your medical condition (cold, fever, infections).   __x__ 5. On the morning of surgery brush your teeth with toothpaste and water.  You may rinse your mouth with mouthwash if you wish.  Do not swallow any toothpaste or mouthwash.  Please read over the following fact sheets that you were given:   [facility] Preparing for Surgery and/or MRSA Information    __x__ Use CHG Soap as directed on instruction sheet   Do not wear jewelry, make-up, hairpins, clips or nail polish on the day of surgery.  Do not wear lotions, powders, deodorant, or perfumes.   Do not shave below the face/neck 48 hours prior to surgery.   Do not bring valuables to the hospital.    [facility] is not responsible for any belongings or valuables.               Contacts, dentures or bridgework may not be worn into  surgery.  For patients discharged on the day of surgery, you will NOT be permitted to drive yourself home.  You must have a responsible adult with you for 24 hours after surgery.  __x__ Take these medications on the morning of surgery with a SMALL SIP OF WATER:  1. Omeprazole (Prilosec)  __x__ Bring C-Pap/Bi-Pap machine to the hospital.   __x__ Follow recommendations from Cardiologist, Pulmonologist or PCP regarding stopping Aspirin, Coumadin, Plavix, Eliquis, Effient, Pradaxa, and Pletal.  __x__ Avoid Anti-inflammatories such as Advil, Ibuprofen, Motrin, Aleve, Naproxen, Naprosyn, BC/Goodies powders or aspirin products. You may continue to take Tylenol and Celebrex.   __x__ Avoid supplements until after surgery. You may continue to take Vitamin D, Vitamin B, and multivitamin.

## 2018-09-09 NOTE — Progress Notes (Signed)
Patient to call back for letter.

## 2018-09-10 ENCOUNTER — Other Ambulatory Visit: Payer: Self-pay

## 2018-09-10 ENCOUNTER — Encounter: Payer: Self-pay | Admitting: *Deleted

## 2018-09-10 ENCOUNTER — Inpatient Hospital Stay: Payer: BLUE CROSS/BLUE SHIELD | Attending: Oncology | Admitting: Oncology

## 2018-09-10 ENCOUNTER — Telehealth: Payer: Self-pay | Admitting: *Deleted

## 2018-09-10 VITALS — BP 154/89 | HR 80 | Temp 97.8°F | Ht 65.0 in | Wt 214.9 lb

## 2018-09-10 DIAGNOSIS — Z170 Estrogen receptor positive status [ER+]: Secondary | ICD-10-CM | POA: Insufficient documentation

## 2018-09-10 DIAGNOSIS — C50211 Malignant neoplasm of upper-inner quadrant of right female breast: Secondary | ICD-10-CM | POA: Diagnosis not present

## 2018-09-10 DIAGNOSIS — Z79899 Other long term (current) drug therapy: Secondary | ICD-10-CM | POA: Diagnosis not present

## 2018-09-10 DIAGNOSIS — I10 Essential (primary) hypertension: Secondary | ICD-10-CM | POA: Diagnosis not present

## 2018-09-10 MED ORDER — CEFAZOLIN SODIUM-DEXTROSE 2-4 GM/100ML-% IV SOLN
2.0000 g | INTRAVENOUS | Status: AC
  Administered 2018-09-11 (×2): 2 g via INTRAVENOUS

## 2018-09-10 NOTE — Progress Notes (Signed)
  Oncology Nurse Navigator Documentation  Navigator Location: [facility]Med [facility] (09/10/18 1400) Referral date to RadOnc/MedOnc: 09/10/18 (09/10/18 1400) )Navigator Encounter Type: Initial MedOnc (09/10/18 1400)       Surgery Date: 09/10/18 (09/10/18 1400)             Patient Visit Type: MedOnc (09/10/18 1400) Treatment Phase: Pre-Tx/Tx Discussion (09/10/18 1400) Barriers/Navigation Needs: Education (09/10/18 1400) Education: Understanding Cancer/ Treatment Options;Newly Diagnosed Cancer Education (09/10/18 1400) Interventions: Education (09/10/18 1400)     Education Method: Written (09/10/18 1400)  Support Groups/Services: Breast Support Group (09/10/18 1400)             Time Spent with Patient: 60 (09/10/18 1400)   Met patient today during her initial medical oncology consult with Dr. Grayland Ormond.  He has treated her mom for lung cancer.  She was a little overwhelmed that she would need chemotherapy.  Patient is ER/PR + Her2 +.  Surgery is scheduled for tomorrow.  Offered support.  Gave patient breast cancer educational literature, "My Breast Cancer Treatment Handbook" by Josephine Igo, RN.

## 2018-09-10 NOTE — Telephone Encounter (Signed)
Call placed to [facility] regarding need for port placement tomorrow during lumpectomy. Spoke with Janett Billow, if okay with scheduling Dr. Rosana Hoes will be able to place port tomorrow during surgery.

## 2018-09-10 NOTE — Progress Notes (Signed)
Patient is here today to establish care for her right breast invasive carcinoma. Patient stated that she had gone to [facility] to have her yearly mammogram and was told that she needed to have a diagnostic mammogram and U/S. Once that was done, she needed a biopsy done. Patient was then told that she had right breast invasive carcinoma. Patient was seen by Dr. Rosana Hoes (general surgeon) and now she is scheduled to have a right breast lumpectomy tomorrow 09/11/2018.

## 2018-09-11 ENCOUNTER — Encounter: Payer: Self-pay | Admitting: *Deleted

## 2018-09-11 ENCOUNTER — Ambulatory Visit
Admission: RE | Admit: 2018-09-11 | Discharge: 2018-09-11 | Disposition: A | Discharge status: Home / Self Care | Payer: BLUE CROSS/BLUE SHIELD | Source: Ambulatory Visit | Attending: Surgery | Admitting: Surgery

## 2018-09-11 ENCOUNTER — Other Ambulatory Visit: Payer: Self-pay | Admitting: Surgery

## 2018-09-11 ENCOUNTER — Other Ambulatory Visit: Payer: Self-pay

## 2018-09-11 ENCOUNTER — Ambulatory Visit: Payer: BLUE CROSS/BLUE SHIELD | Admitting: Anesthesiology

## 2018-09-11 ENCOUNTER — Encounter
Admission: RE | Disposition: A | Discharge status: Home / Self Care | Payer: Self-pay | Source: Ambulatory Visit | Attending: Surgery

## 2018-09-11 ENCOUNTER — Ambulatory Visit: Payer: BLUE CROSS/BLUE SHIELD

## 2018-09-11 DIAGNOSIS — C50211 Malignant neoplasm of upper-inner quadrant of right female breast: Secondary | ICD-10-CM

## 2018-09-11 DIAGNOSIS — Z79899 Other long term (current) drug therapy: Secondary | ICD-10-CM | POA: Insufficient documentation

## 2018-09-11 DIAGNOSIS — Z170 Estrogen receptor positive status [ER+]: Principal | ICD-10-CM

## 2018-09-11 DIAGNOSIS — Z789 Other specified health status: Secondary | ICD-10-CM

## 2018-09-11 DIAGNOSIS — I2510 Atherosclerotic heart disease of native coronary artery without angina pectoris: Secondary | ICD-10-CM | POA: Insufficient documentation

## 2018-09-11 DIAGNOSIS — I10 Essential (primary) hypertension: Secondary | ICD-10-CM | POA: Insufficient documentation

## 2018-09-11 DIAGNOSIS — I341 Nonrheumatic mitral (valve) prolapse: Secondary | ICD-10-CM | POA: Diagnosis not present

## 2018-09-11 DIAGNOSIS — E785 Hyperlipidemia, unspecified: Secondary | ICD-10-CM | POA: Diagnosis not present

## 2018-09-11 DIAGNOSIS — M1990 Unspecified osteoarthritis, unspecified site: Secondary | ICD-10-CM | POA: Diagnosis not present

## 2018-09-11 HISTORY — PX: PORTACATH PLACEMENT: SHX2246

## 2018-09-11 HISTORY — PX: BREAST LUMPECTOMY: SHX2

## 2018-09-11 HISTORY — PX: BREAST LUMPECTOMY WITH NEEDLE LOCALIZATION AND AXILLARY SENTINEL LYMPH NODE BX: SHX5760

## 2018-09-11 SURGERY — BREAST LUMPECTOMY WITH NEEDLE LOCALIZATION AND AXILLARY SENTINEL LYMPH NODE BX
Anesthesia: General | Site: Chest | Laterality: Right

## 2018-09-11 MED ORDER — MIDAZOLAM HCL 2 MG/2ML IJ SOLN
INTRAMUSCULAR | Status: AC
  Filled 2018-09-11: qty 2

## 2018-09-11 MED ORDER — OXYCODONE HCL 5 MG PO TABS
5.0000 mg | ORAL_TABLET | Freq: Once | ORAL | Status: AC | PRN
  Administered 2018-09-11: 5 mg via ORAL

## 2018-09-11 MED ORDER — PHENYLEPHRINE HCL 10 MG/ML IJ SOLN
INTRAMUSCULAR | Status: DC | PRN
  Administered 2018-09-11: 100 ug via INTRAVENOUS

## 2018-09-11 MED ORDER — LIDOCAINE HCL (PF) 2 % IJ SOLN
INTRAMUSCULAR | Status: AC
  Filled 2018-09-11: qty 10

## 2018-09-11 MED ORDER — FENTANYL CITRATE (PF) 100 MCG/2ML IJ SOLN
INTRAMUSCULAR | Status: DC | PRN
  Administered 2018-09-11 (×12): 25 ug via INTRAVENOUS

## 2018-09-11 MED ORDER — PROPOFOL 10 MG/ML IV BOLUS
INTRAVENOUS | Status: DC | PRN
  Administered 2018-09-11: 200 mg via INTRAVENOUS

## 2018-09-11 MED ORDER — FENTANYL CITRATE (PF) 100 MCG/2ML IJ SOLN
INTRAMUSCULAR | Status: AC
  Filled 2018-09-11: qty 2

## 2018-09-11 MED ORDER — GABAPENTIN 300 MG PO CAPS
300.0000 mg | ORAL_CAPSULE | ORAL | Status: AC
  Administered 2018-09-11: 300 mg via ORAL

## 2018-09-11 MED ORDER — ONDANSETRON HCL 4 MG/2ML IJ SOLN
INTRAMUSCULAR | Status: DC | PRN
  Administered 2018-09-11: 4 mg via INTRAVENOUS

## 2018-09-11 MED ORDER — GABAPENTIN 300 MG PO CAPS
ORAL_CAPSULE | ORAL | Status: AC
  Administered 2018-09-11: 300 mg via ORAL
  Filled 2018-09-11: qty 1

## 2018-09-11 MED ORDER — FENTANYL CITRATE (PF) 100 MCG/2ML IJ SOLN
INTRAMUSCULAR | Status: AC
  Administered 2018-09-11: 25 ug via INTRAVENOUS
  Filled 2018-09-11: qty 2

## 2018-09-11 MED ORDER — PROPOFOL 10 MG/ML IV BOLUS
INTRAVENOUS | Status: AC
  Filled 2018-09-11: qty 20

## 2018-09-11 MED ORDER — ACETAMINOPHEN 500 MG PO TABS
1000.0000 mg | ORAL_TABLET | ORAL | Status: AC
  Administered 2018-09-11: 1000 mg via ORAL

## 2018-09-11 MED ORDER — HEPARIN SOD (PORK) LOCK FLUSH 100 UNIT/ML IV SOLN
INTRAVENOUS | Status: AC
  Filled 2018-09-11: qty 5

## 2018-09-11 MED ORDER — DEXMEDETOMIDINE HCL 200 MCG/2ML IV SOLN
INTRAVENOUS | Status: DC | PRN
  Administered 2018-09-11: 4 ug via INTRAVENOUS
  Administered 2018-09-11: 8 ug via INTRAVENOUS

## 2018-09-11 MED ORDER — OXYCODONE HCL 5 MG/5ML PO SOLN: 5.0000 mg | Freq: Once | ORAL | Status: AC | PRN

## 2018-09-11 MED ORDER — HEPARIN SOD (PORK) LOCK FLUSH 100 UNIT/ML IV SOLN
INTRAVENOUS | Status: DC | PRN
  Administered 2018-09-11: 200 [IU] via INTRAVENOUS

## 2018-09-11 MED ORDER — LIDOCAINE HCL (PF) 1 % IJ SOLN
INTRAMUSCULAR | Status: AC
  Filled 2018-09-11: qty 60

## 2018-09-11 MED ORDER — DEXAMETHASONE SODIUM PHOSPHATE 10 MG/ML IJ SOLN
INTRAMUSCULAR | Status: DC | PRN
  Administered 2018-09-11: 10 mg via INTRAVENOUS

## 2018-09-11 MED ORDER — OXYCODONE HCL 5 MG PO TABS
ORAL_TABLET | ORAL | Status: AC
  Filled 2018-09-11: qty 1

## 2018-09-11 MED ORDER — CEFAZOLIN SODIUM-DEXTROSE 2-4 GM/100ML-% IV SOLN
INTRAVENOUS | Status: AC
  Filled 2018-09-11: qty 100

## 2018-09-11 MED ORDER — ACETAMINOPHEN 500 MG PO TABS
ORAL_TABLET | ORAL | Status: AC
  Administered 2018-09-11: 1000 mg via ORAL
  Filled 2018-09-11: qty 2

## 2018-09-11 MED ORDER — LIDOCAINE HCL 1 % IJ SOLN
INTRAMUSCULAR | Status: DC | PRN
  Administered 2018-09-11: 40 mL

## 2018-09-11 MED ORDER — SODIUM CHLORIDE (PF) 0.9 % IJ SOLN
INTRAMUSCULAR | Status: AC
  Filled 2018-09-11: qty 50

## 2018-09-11 MED ORDER — LIDOCAINE HCL (CARDIAC) PF 100 MG/5ML IV SOSY
PREFILLED_SYRINGE | INTRAVENOUS | Status: DC | PRN
  Administered 2018-09-11: 100 mg via INTRAVENOUS

## 2018-09-11 MED ORDER — PROMETHAZINE HCL 25 MG/ML IJ SOLN: 6.2500 mg | INTRAMUSCULAR | Status: DC | PRN

## 2018-09-11 MED ORDER — ISOSULFAN BLUE 1 % ~~LOC~~ SOLN
SUBCUTANEOUS | Status: DC | PRN
  Administered 2018-09-11: 5 mL via SUBCUTANEOUS

## 2018-09-11 MED ORDER — ONDANSETRON HCL 4 MG/2ML IJ SOLN
INTRAMUSCULAR | Status: AC
  Filled 2018-09-11: qty 2

## 2018-09-11 MED ORDER — MIDAZOLAM HCL 2 MG/2ML IJ SOLN
INTRAMUSCULAR | Status: DC | PRN
  Administered 2018-09-11: 2 mg via INTRAVENOUS

## 2018-09-11 MED ORDER — BUPIVACAINE HCL (PF) 0.5 % IJ SOLN
INTRAMUSCULAR | Status: AC
  Filled 2018-09-11: qty 30

## 2018-09-11 MED ORDER — CEFAZOLIN SODIUM 1 G IJ SOLR
INTRAMUSCULAR | Status: AC
  Filled 2018-09-11: qty 20

## 2018-09-11 MED ORDER — TECHNETIUM TC 99M SULFUR COLLOID FILTERED
1.0000 | Freq: Once | INTRAVENOUS | Status: AC | PRN
  Administered 2018-09-11: 0.785 via INTRADERMAL

## 2018-09-11 MED ORDER — CHLORHEXIDINE GLUCONATE CLOTH 2 % EX PADS: 6.0000 | MEDICATED_PAD | Freq: Once | CUTANEOUS | Status: DC

## 2018-09-11 MED ORDER — LACTATED RINGERS IV SOLN
INTRAVENOUS | Status: DC
  Administered 2018-09-11: 10:00:00 via INTRAVENOUS

## 2018-09-11 MED ORDER — OXYCODONE-ACETAMINOPHEN 5-325 MG PO TABS: 1.0000 | ORAL_TABLET | ORAL | 0 refills | Status: DC | PRN

## 2018-09-11 MED ORDER — DEXAMETHASONE SODIUM PHOSPHATE 10 MG/ML IJ SOLN
INTRAMUSCULAR | Status: AC
  Filled 2018-09-11: qty 1

## 2018-09-11 MED ORDER — MEPERIDINE HCL 50 MG/ML IJ SOLN: 6.2500 mg | INTRAMUSCULAR | Status: DC | PRN

## 2018-09-11 MED ORDER — FENTANYL CITRATE (PF) 100 MCG/2ML IJ SOLN
25.0000 ug | INTRAMUSCULAR | Status: DC | PRN
  Administered 2018-09-11 (×4): 25 ug via INTRAVENOUS

## 2018-09-11 MED ORDER — SEVOFLURANE IN SOLN
RESPIRATORY_TRACT | Status: AC
  Filled 2018-09-11: qty 250

## 2018-09-11 SURGICAL SUPPLY — 55 items
BAG DECANTER FOR FLEXI CONT (MISCELLANEOUS) ×4 IMPLANT
BLADE SURG 15 STRL LF DISP TIS (BLADE) ×2 IMPLANT
BLADE SURG 15 STRL SS (BLADE) ×2
BLADE SURG SZ11 CARB STEEL (BLADE) ×4 IMPLANT
CANISTER SUCT 1200ML W/VALVE (MISCELLANEOUS) ×4 IMPLANT
CHLORAPREP W/TINT 26ML (MISCELLANEOUS) ×8 IMPLANT
CNTNR SPEC 2.5X3XGRAD LEK (MISCELLANEOUS)
CONT SPEC 4OZ STER OR WHT (MISCELLANEOUS)
CONTAINER SPEC 2.5X3XGRAD LEK (MISCELLANEOUS) ×6 IMPLANT
COVER LIGHT HANDLE STERIS (MISCELLANEOUS) ×8 IMPLANT
COVER PROBE FLX POLY STRL (MISCELLANEOUS) ×4 IMPLANT
COVER WAND RF STERILE (DRAPES) ×4 IMPLANT
DECANTER SPIKE VIAL GLASS SM (MISCELLANEOUS) ×8 IMPLANT
DERMABOND ADVANCED (GAUZE/BANDAGES/DRESSINGS) ×2
DERMABOND ADVANCED .7 DNX12 (GAUZE/BANDAGES/DRESSINGS) ×2 IMPLANT
DEVICE DUBIN SPECIMEN MAMMOGRA (MISCELLANEOUS) ×4 IMPLANT
DRAPE C-ARM 42X70 (DRAPES) ×4 IMPLANT
DRAPE LAPAROTOMY 77X122 PED (DRAPES) ×4 IMPLANT
DRAPE LAPAROTOMY TRNSV 106X77 (MISCELLANEOUS) ×4 IMPLANT
DRAPE SHEET LG 3/4 BI-LAMINATE (DRAPES) ×4 IMPLANT
ELECT CAUTERY BLADE 6.4 (BLADE) ×4 IMPLANT
ELECT REM PT RETURN 9FT ADLT (ELECTROSURGICAL) ×4
ELECTRODE REM PT RTRN 9FT ADLT (ELECTROSURGICAL) ×2 IMPLANT
GLOVE BIO SURGEON STRL SZ7 (GLOVE) ×12 IMPLANT
GLOVE BIOGEL PI IND STRL 7.5 (GLOVE) ×2 IMPLANT
GLOVE BIOGEL PI INDICATOR 7.5 (GLOVE) ×8
GLOVE INDICATOR 7.5 STRL GRN (GLOVE) ×8 IMPLANT
GOWN STRL REUS W/ TWL LRG LVL3 (GOWN DISPOSABLE) ×4 IMPLANT
GOWN STRL REUS W/TWL LRG LVL3 (GOWN DISPOSABLE) ×14 IMPLANT
IV NS 500ML (IV SOLUTION) ×2
IV NS 500ML BAXH (IV SOLUTION) ×2 IMPLANT
KIT PORT POWER 8FR ISP CVUE (Port) ×4 IMPLANT
KIT TURNOVER KIT A (KITS) ×4 IMPLANT
LABEL OR SOLS (LABEL) ×4 IMPLANT
NDL HYPO 25X1 1.5 SAFETY (NEEDLE) ×4 IMPLANT
NDL SAFETY ECLIPSE 18X1.5 (NEEDLE) ×2 IMPLANT
NEEDLE HYPO 18GX1.5 SHARP (NEEDLE) ×2
NEEDLE HYPO 25X1 1.5 SAFETY (NEEDLE) ×8 IMPLANT
PACK BASIN MINOR ARMC (MISCELLANEOUS) ×4 IMPLANT
PENCIL ELECTRO HAND CTR (MISCELLANEOUS) ×2 IMPLANT
SLEVE PROBE SENORX GAMMA FIND (MISCELLANEOUS) ×6 IMPLANT
SPONGE XRAY 4X4 16PLY STRL (MISCELLANEOUS) ×2 IMPLANT
SUT MNCRL 4-0 (SUTURE) ×2
SUT MNCRL 4-0 27XMFL (SUTURE) ×2
SUT MNCRL AB 4-0 PS2 18 (SUTURE) ×4 IMPLANT
SUT SILK 2 0 (SUTURE) ×2
SUT SILK 2 0 SH (SUTURE) ×4 IMPLANT
SUT SILK 2-0 18XBRD TIE 12 (SUTURE) IMPLANT
SUT VIC AB 3-0 SH 27 (SUTURE) ×2
SUT VIC AB 3-0 SH 27X BRD (SUTURE) ×2 IMPLANT
SUTURE MNCRL 4-0 27XMF (SUTURE) ×2 IMPLANT
SYR 10ML LL (SYRINGE) ×8 IMPLANT
SYR 20CC LL (SYRINGE) ×4 IMPLANT
SYR BULB 3OZ (MISCELLANEOUS) ×4 IMPLANT
WATER STERILE IRR 1000ML POUR (IV SOLUTION) ×4 IMPLANT

## 2018-09-11 NOTE — Anesthesia Postprocedure Evaluation (Signed)
Anesthesia Post Note  Patient: Erin Church  Procedure(s) Performed: BREAST LUMPECTOMY WITH NEEDLE LOCALIZATION AND SENTINEL LYMPH NODE BX (Right ) INSERTION PORT-A-CATH (Right Chest)  Patient location during evaluation: PACU Anesthesia Type: General Level of consciousness: awake and alert Pain management: pain level controlled Vital Signs Assessment: post-procedure vital signs reviewed and stable Respiratory status: spontaneous breathing, nonlabored ventilation, respiratory function stable and patient connected to nasal cannula oxygen Cardiovascular status: blood pressure returned to baseline and stable Postop Assessment: no apparent nausea or vomiting Anesthetic complications: no     Last Vitals:  Vitals:   09/11/18 1520 09/11/18 1602  BP: (!) 116/92 (!) 156/97  Pulse: (!) 101 96  Resp: 18 20  Temp: 37.2 C 37.2 C  SpO2: 100% 98%    Last Pain:  Vitals:   09/11/18 1602  TempSrc: Temporal  PainSc: [place]

## 2018-09-11 NOTE — Anesthesia Procedure Notes (Signed)
Procedure Name: LMA Insertion Date/Time: 09/11/2018 10:30 AM Performed by: Eben Burow, CRNA Pre-anesthesia Checklist: Patient identified, Emergency Drugs available, Suction available and Patient being monitored Patient Re-evaluated:Patient Re-evaluated prior to induction Oxygen Delivery Method: Circle system utilized Preoxygenation: Pre-oxygenation with 100% oxygen Induction Type: IV induction Ventilation: Mask ventilation without difficulty LMA: LMA inserted LMA Size: 4.0 Number of attempts: 1 Placement Confirmation: positive ETCO2 and breath sounds checked- equal and bilateral Tube secured with: Tape Dental Injury: Teeth and Oropharynx as per pre-operative assessment

## 2018-09-11 NOTE — Discharge Instructions (Addendum)
AMBULATORY SURGERY  DISCHARGE INSTRUCTIONS   1) The drugs that you were given will stay in your system until tomorrow so for the next 24 hours you should not:  A) Drive an automobile B) Make any legal decisions C) Drink any alcoholic beverage   2) You may resume regular meals tomorrow.  Today it is better to start with liquids and gradually work up to solid foods.  You may eat anything you prefer, but it is better to start with liquids, then soup and crackers, and gradually work up to solid foods.   3) Please notify your doctor immediately if you have any unusual bleeding, trouble breathing, redness and pain at the surgery site, drainage, fever, or pain not relieved by medication. 4)   5) Your post-operative visit with Dr.                                     is: Date:                        Time:    Please call to schedule your post-operative visit.  6) Additional Instructions:        In addition to included general post-operative instructions for Right Breast Excisional Biopsy (Lumpectomy), Right Axillary Sentinel Lymph Node Biopsy, and Insertion of Right Internal Jugular Central Venous Catheter with Port,  Diet: Resume home heart healthy diet.   Activity: No heavy lifting >20 pounds (children, pets, laundry, garbage) or strenuous activity until follow-up, but light activity and walking are encouraged. Do not drive or drink alcohol if taking narcotic pain medications.  Wound care: 2 days after surgery (Friday, 1/17), you may shower/get incision wet with soapy water and pat dry (do not rub incisions or apply any ointments or deodorant), but no baths or submerging incision underwater until follow-up.   Medications: Resume all home medications. For mild to moderate pain: acetaminophen (Tylenol) or ibuprofen/naproxen (if no kidney disease). Combining Tylenol with alcohol can substantially increase your risk of causing liver disease. Narcotic pain medications, if prescribed, can  be used for severe pain, though may cause nausea, constipation, and drowsiness. Do not combine Tylenol and Percocet (or similar) within a 6 hour period as Percocet (and similar) contain(s) Tylenol. If you do not need the narcotic pain medication, you do not need to fill the prescription.  Call office [phone]) at any time if any questions, worsening pain, fevers/chills, bleeding, drainage from incision site, or other concerns.

## 2018-09-11 NOTE — Progress Notes (Signed)
START ON PATHWAY REGIMEN - Breast   Paclitaxel Weekly + Trastuzumab Weekly:   Administer weekly:     Paclitaxel      Trastuzumab-xxxx      Trastuzumab-xxxx   **Always confirm dose/schedule in your pharmacy ordering system**  Trastuzumab (Maintenance - NO Loading Dose):   A cycle is every 21 days:     Trastuzumab-xxxx   **Always confirm dose/schedule in your pharmacy ordering system**  Patient Characteristics: Postoperative without Neoadjuvant Therapy (Pathologic Staging), Invasive Disease, Adjuvant Therapy, HER2 Positive, ER Positive, Node Negative, pT1c, pN0/N15m Therapeutic Status: Postoperative without Neoadjuvant Therapy (Pathologic Staging) AJCC Grade: GX AJCC N Category: pNX AJCC M Category: cM0 ER Status: Positive (+) AJCC 8 Stage Grouping: IA HER2 Status: Positive (+) Oncotype Dx Recurrence Score: Not Appropriate AJCC T Category: pTX PR Status: Positive (+) Intent of Therapy: Curative Intent, Discussed with Patient

## 2018-09-11 NOTE — Transfer of Care (Signed)
Immediate Anesthesia Transfer of Care Note  Patient: Erin Church  Procedure(s) Performed: BREAST LUMPECTOMY WITH NEEDLE LOCALIZATION AND SENTINEL LYMPH NODE BX (Right ) INSERTION PORT-A-CATH (Right Chest)  Patient Location: PACU  Anesthesia Type:General  Level of Consciousness: awake, alert , oriented and patient cooperative  Airway & Oxygen Therapy: Patient Spontanous Breathing and Patient connected to face mask oxygen  Post-op Assessment: Report given to RN and Post -op Vital signs reviewed and stable  Post vital signs: Reviewed and stable  Last Vitals:  Vitals Value Taken Time  BP 144/100 09/11/2018  2:24 PM  Temp    Pulse 102 09/11/2018  2:27 PM  Resp 16 09/11/2018  2:27 PM  SpO2 98 % 09/11/2018  2:27 PM  Vitals shown include unvalidated device data.  Last Pain:  Vitals:   09/11/18 0939  TempSrc: Oral  PainSc: 0-No pain         Complications: No apparent anesthesia complications

## 2018-09-11 NOTE — Op Note (Signed)
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 requiring central venous access for chemotherapy (icd-10: C50.211)   PROCEDURE(S):  1.) Right partial mastectomy/lumpectomy with image-guided wire localization (cpt: 19301) 2.) Excisional biopsy of Right axillary deep sentinel lymph nodes using radiolabel/tracer and lymphazurine blue dye (cpt: 38525) 3.) Percutaneous access of Right internal jugular vein under ultrasound guidance  4.) Insertion of tunneled Right internal jugular Bard PowerPort central venous catheter with subcutaneous port (cpt: 67209)   INTRAOPERATIVE FINDINGS: Wire-localized mammary tissue removed in its entirety (no palpable mass appreciated) including biopsy-associated winged clip, confirmed central within specimen intra-operatively by radiology, maximum pre-SLN biopsy radiotracer count: 2200, maximum radiotracer count in 1st specimen: 2000 with +blue dye, maximum radiotracer count in 2nd specimen: 300 with +blue dye, maximum radiotracer count in 3rd specimen: negligible without blue dye, final radiotracer count: 73; patent easily compressible Right internal jugular vein with appropriate respiratory variations and well-secured tunneled central venous catheter with subcutaneous port at completion of the procedure   INTRAVENOUS FLUIDS: 900 mL crystalloid    ESTIMATED BLOOD LOSS: Minimal (<20 mL)   URINE OUTPUT: No Foley    SPECIMENS: Right breast lumpectomy/partial mastectomy (oriented with long lateral silk suture, short superior silk suture, and double suture to deep margin)  and deep Right axillary sentinel lymph nodes   IMPLANTS: 55F tunneled Bard PowerPort central venous catheter with subcutaneous port   DRAINS: None   COMPLICATIONS: None apparent   CONDITION AT END OF PROCEDURE: Hemodynamically stable and extubated   DISPOSITION OF PATIENT: PACU   INDICATIONS FOR PROCEDURE:  58 y.o. female presented for evaluation of abnormal Right breast diagnostic mammogram with focal breast ultrasound and abnormal core needle biopsy findings consistent with invasive mammary adenocarcinoma. Durable central venous access for chemotherapy was also requested. All risks, benefits, and alternatives to lumpectomy vs mastectomy were discussed with the patient, all of patient's questions were answered to her expressed satisfaction, patient elected to proceed with lumpectomy with excisional biopsy of sentinel lymph node(s) and placement of central venous catheter with indwelling subcutaneous port, and informed consent was accordingly obtained and documented at that time.   DETAILS OF PROCEDURE: Pre-operative needle-/wire-localization was performed by radiology, and localization studies were reviewed. Patient was brought to the operating suite and appropriately identified. General anesthesia was administered along with appropriate pre-operative antibiotics, and endotracheal intubation was performed by anesthetist. In supine position, operative site was prepped and draped in the usual sterile fashion, and following a brief time out, maximum Right axillary radiotracer count was checked using probe/detector. 1 mL aliquots x 5 of lymphazurine blue dye were injected peri-areola and massaged into the tissue x ~5 minutes.   Local anesthetic was then injected, and a slightly upper medial circumareolar incision was marked, injected with local anesthetic both along the planned incision site and surrounding mass for planned excision, after which incision was made using a #15 blade scalpel, around which  appropriate thickness skin flaps towards wire-localized mass were created to incorporate the guidewire in its entirety, and excision was continued deep to chest wall to include the localization wire/needle in its entirety with appropriately wide margins. Long lateral suture and short superior suture were used to orient the specimen, which was then  handed off the field for radiographic assessment and pathology processing. Hemostasis was achieved, and the wound was re-approximated using buried interrupted 3-0 Vicryl.   Attention was then directed to the Right axilla, where axillary hair line, pectoralis muscle, and latissimus dorsal muscle were marked using a skin marking pen. Local anesthetic was injected over inferior to the site of maximum radiotracer count, and a ~2 cm curvilinear incision was made using #15 blade scalpel and extended deep through subcutaneous tissue using blunt dissection and selective electrocautery, tying any small venous and lymphatic branches using silk ties. Lymph node(s) with peak radiotracer counts and discrete focus of blue dye was identified, ligated, and excised with a radiotracer count of 2000. A second specimen was similarly identified and excised with radiotracer count of 300, after which there remained no further radiotracer counts >10% of initial counts, nor any further visualized blue dye in the remaining axillary bed. Radiographic confirmation that the entire lesion was excised -- including the localizing wire and clip -- was received prior to completion of closure. Right axillary dermis was re-approximated buried interrupted 3-0 Vicryl for dermis and 4-0 Monocryl for epidermis, after which skin was then cleaned and dried, and sterile Dermabond skin glue was applied and allowed to dry.  Patient was then re-prepped, re-draped, and repositioned for insertion of Right IJ central venous catheter with subcutaneous port after confirming position of the subcutaneous port away from  anticipated radiation site. In Trendelenburg position, Right/ IJ venous access site was prepped and draped in the usual sterile fashion, and following a brief timeout, limited duplex evaluation of the Right internal jugular vein was performed. Percutaneous Right IJ venous access was obtained under ultrasound guidance using Seldinger technique, by which local anesthetic was injected over the Right IJ vein, and access needle was inserted under direct ultrasound visualization into the Right IJ vein, through which soft guidewire was advanced, over which access needle was withdrawn. Guidewire was secured, attention was directed to injection of local anesthetic along the planned tunnel site, 2-3 cm transverse Right chest incision was made and confirmed to accommodate the subcutaneous port, and flushed catheter was tunneled retrograde from the port site over the Right chest to the Right IJ access site with the attached port well-secured to the catheter and within the subcutaneous pocket. Insertion sheath was advanced over the guidewire, which was withdrawn along with the insertion sheath dilator. Length of catheter needed to position the catheter tip at the atrio-caval junction was then measured under direct fluoroscopic visualization, after which the catheter was cut to the measured length and advanced through the sheath into the Right internal jugular vein and SVC without evidence of cardiac arrhythmias during the procedure. Port was confirmed to withdraw blood and flush easily, after which concentrated heparin was instilled into the port and catheter. Dermis at the subcutaneous pocket was re-approximated using buried interrupted 3-0 Vicryl suture, and 4-0 Vicryl suture was used to re-approximate skin at the insertion and subcutaneous port sites in running subcuticular fashion for the subcutaneous port and buried interrupted fashion for the insertion site. Skin was cleaned, dried, and sterile skin glue was applied.  Patient was then safely transferred to PACU for a chest x-ray.  Patient was then safely able to be extubated, awakened, and transferred to PACU for post-operative monitoring and care.   I was present for all aspects of the above procedure, and no operative complications were apparent.

## 2018-09-11 NOTE — Interval H&P Note (Signed)
History and Physical Interval Note:  09/11/2018 10:05 AM  Erin Church  has presented today for surgery, with the diagnosis of right breast cancer  The various methods of treatment have been discussed with the patient and family. After consideration of risks, benefits and other options for treatment, the patient has consented to  Procedure(s): BREAST LUMPECTOMY WITH NEEDLE LOCALIZATION AND SENTINEL LYMPH NODE BX (Right) INSERTION PORT-A-CATH (N/A) as a surgical intervention .  The patient's history has been reviewed, patient examined, no change in status, stable for surgery.  I have reviewed the patient's chart and labs.  Questions were answered to the patient's satisfaction.     [name]

## 2018-09-11 NOTE — Anesthesia Preprocedure Evaluation (Signed)
Anesthesia Evaluation  Patient identified by MRN, date of birth, ID band Patient awake    Reviewed: Allergy & Precautions, NPO status , Patient's Chart, lab work & pertinent test results  History of Anesthesia Complications Negative for: history of anesthetic complications  Airway Mallampati: II  TM Distance: >3 FB Neck ROM: Full    Dental  (+) Implants   Pulmonary sleep apnea and Continuous Positive Airway Pressure Ventilation , neg COPD,    breath sounds clear to auscultation- rhonchi (-) wheezing      Cardiovascular Exercise Tolerance: Good hypertension, Pt. on medications + CAD (nonocclusive)  (-) Past MI, (-) Cardiac Stents and (-) CABG  Rhythm:Regular Rate:Normal - Systolic murmurs and - Diastolic murmurs    Neuro/Psych neg Seizures negative neurological ROS  negative psych ROS   GI/Hepatic negative GI ROS, Neg liver ROS,   Endo/Other  negative endocrine ROSneg diabetes  Renal/GU negative Renal ROS     Musculoskeletal  (+) Arthritis ,   Abdominal (+) + obese,   Peds  Hematology negative hematology ROS (+)   Anesthesia Other Findings Past Medical History: No date: Anginal pain (HCC) No date: Arthritis No date: Carpal tunnel syndrome of right wrist No date: Coronary atherosclerosis of unspecified type of vessel,  native or graft No date: Hand and foot pain No date: Hyperlipidemia No date: Hypertension No date: MVP (mitral valve prolapse) No date: Sleep apnea No date: Vertigo   Reproductive/Obstetrics                             Anesthesia Physical Anesthesia Plan  ASA: II  Anesthesia Plan: General   Post-op Pain Management:    Induction: Intravenous  PONV Risk Score and Plan: 2 and Ondansetron, Dexamethasone and Midazolam  Airway Management Planned: LMA  Additional Equipment:   Intra-op Plan:   Post-operative Plan:   Informed Consent: I have reviewed the  patients History and Physical, chart, labs and discussed the procedure including the risks, benefits and alternatives for the proposed anesthesia with the patient or authorized representative who has indicated his/her understanding and acceptance.     Dental advisory given  Plan Discussed with: CRNA and Anesthesiologist  Anesthesia Plan Comments:         Anesthesia Quick Evaluation

## 2018-09-11 NOTE — Anesthesia Post-op Follow-up Note (Signed)
Anesthesia QCDR form completed.        

## 2018-09-12 ENCOUNTER — Telehealth: Payer: Self-pay | Admitting: *Deleted

## 2018-09-12 NOTE — Telephone Encounter (Signed)
Patient called and stated that her discharge papers from the hospital stated that she needs to take her antibiotics as directed but patient stated that she didn't receive any prescription for any antibiotics and nothing was called into her pharmacy.

## 2018-09-12 NOTE — Telephone Encounter (Signed)
Advised patient only pain medication was sent in.

## 2018-09-13 ENCOUNTER — Encounter: Payer: Self-pay | Admitting: Surgery

## 2018-09-17 LAB — SURGICAL PATHOLOGY

## 2018-09-24 ENCOUNTER — Other Ambulatory Visit: Payer: Self-pay

## 2018-09-24 ENCOUNTER — Encounter
Admission: RE | Admit: 2018-09-24 | Discharge: 2018-09-24 | Disposition: A | Discharge status: Home / Self Care | Payer: BLUE CROSS/BLUE SHIELD | Source: Ambulatory Visit | Attending: Oncology | Admitting: Oncology

## 2018-09-24 ENCOUNTER — Encounter: Payer: Self-pay | Admitting: Surgery

## 2018-09-24 ENCOUNTER — Ambulatory Visit (INDEPENDENT_AMBULATORY_CARE_PROVIDER_SITE_OTHER): Payer: BLUE CROSS/BLUE SHIELD | Admitting: Surgery

## 2018-09-24 VITALS — BP 176/113 | HR 84 | Temp 97.7°F | Ht 65.0 in | Wt 212.9 lb

## 2018-09-24 DIAGNOSIS — Z4889 Encounter for other specified surgical aftercare: Secondary | ICD-10-CM

## 2018-09-24 DIAGNOSIS — C50211 Malignant neoplasm of upper-inner quadrant of right female breast: Secondary | ICD-10-CM | POA: Insufficient documentation

## 2018-09-24 DIAGNOSIS — Z170 Estrogen receptor positive status [ER+]: Secondary | ICD-10-CM

## 2018-09-24 MED ORDER — TECHNETIUM TC 99M-LABELED RED BLOOD CELLS IV KIT
20.0000 | PACK | Freq: Once | INTRAVENOUS | Status: AC | PRN
  Administered 2018-09-24: 23.51 via INTRAVENOUS

## 2018-09-24 NOTE — Patient Instructions (Addendum)
Patient is to return to the office in 6 months with bilateral mammogram.   Call the office with any questions or concerns.

## 2018-09-25 ENCOUNTER — Telehealth: Payer: Self-pay | Admitting: *Deleted

## 2018-09-25 NOTE — Telephone Encounter (Signed)
Patient called and wanted to know is it okay for her to go back to work and can she put deodorant under her arm now. Please call and advise

## 2018-09-26 ENCOUNTER — Encounter: Payer: Self-pay | Admitting: Surgery

## 2018-09-26 NOTE — Progress Notes (Signed)
Surgical Clinic Progress/Follow-up Note   HPI:  58 y.o. 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 Patient denies any peri-incisional pain and has been tolerating regular diet with +flatus and normal BM's, denies N/V, fever/chills, CP, or SOB.  Review of Systems:  Constitutional: denies fever/chills  Respiratory: denies shortness of breath, wheezing  Cardiovascular: denies chest pain, palpitations  Breast: pain, swelling, and drainage as per interval history Skin: Denies any other rashes or skin discolorations except post-surgical wounds as per interval history  Vital Signs:  BP (!) 176/113   Pulse 84   Temp 97.7 F (36.5 C) (Temporal)   Ht _0  (1.651 m)   Wt 212 lb 14.4 oz (96.6 kg)   SpO2 97%   BMI 35.43 kg/m    Physical Exam:  Constitutional:  -- Normal body habitus  -- Awake, alert, and oriented x3  Pulmonary:  -- No crackles -- Equal breath sounds bilaterally -- Breathing non-labored at rest Cardiovascular:  -- S1, S2 present  -- No pericardial rubs  Breast:  -- Right breast, Right axillary, Right upper chest wall, and Right neck incisions all well-approximated and non-tender to palpation without any surrounding erythema or drainage -- No palpable or otherwise appreciable mass or adenopathy  Musculoskeletal / Integumentary:  -- Wounds or skin discoloration: None appreciated except post-surgical incisions as described above (Breast) -- Extremities: B/L UE and LE FROM, hands and feet warm, no RUE lymphedema  Imaging: No new pertinent imaging available for review  Assessment:  58 y.o. yo Female with a problem list including...  Patient Active Problem List   Diagnosis Date Noted  . Malignant neoplasm of upper-inner quadrant of right breast in female, estrogen receptor positive ([place]) 09/05/2018  . Menopause 10/23/2017  . Vaginal odor 10/23/2017  . Billowing mitral valve 02/13/2014  . Chest pain 02/03/2014  . Arteriosclerosis of coronary artery 02/03/2014  . Essential (primary) hypertension 02/03/2014  . HLD (hyperlipidemia) 02/03/2014  . Muscle ache 02/03/2014  . 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  Plan:              - pathology results discussed              - okay to submerge incisions under water (baths, swimming) prn             - gradually resume all activities without restrictions over next 2 weeks             - apply sunblock particularly to incisions with sun exposure to reduce pigmentation of scars             - return to clinic after follow-up diagnostic Right breast mammogram in 6 months  - will plan for in-office port removal when no longer required  - instructed to call office if any questions or concerns  All of the above recommendations were discussed with the patient, and all of patient's questions were answered to her expressed satisfaction.  --  Marilynne Drivers. Rosana Hoes, MD, [place]: [facility] - Partnering for exceptional care. Office: [phone]

## 2018-09-27 NOTE — Progress Notes (Signed)
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  INTERVAL HISTORY: Patient returns to clinic today to discuss her final pathology results and treatment planning.  She continues to feel well and remains asymptomatic. She has no neurologic complaints.  She denies any recent fevers or illnesses.  She has a good appetite and denies weight loss.  She has no chest pain or shortness of breath.  She denies any nausea, vomiting, constipation, or diarrhea.  She has no urinary complaints.  Patient offers no specific complaints today.  REVIEW OF SYSTEMS:   Review of Systems  Constitutional: Negative.  Negative for fever, malaise/fatigue and weight loss.  Respiratory: Negative.  Negative for cough, hemoptysis and shortness of breath.   Cardiovascular: Negative.  Negative for chest pain and leg swelling.  Gastrointestinal: Negative.  Negative for abdominal pain, blood in stool and melena.  Genitourinary: Negative.  Negative for dysuria.  Musculoskeletal: Negative.  Negative for back pain.  Skin: Negative.  Negative for rash.  Neurological: Negative.  Negative for seizures, weakness and headaches.  Psychiatric/Behavioral: The patient is nervous/anxious.     As per HPI. Otherwise, a complete review of systems is negative.  PAST MEDICAL HISTORY: Past Medical History:  Diagnosis Date  . Anginal pain ([place])   . Arthritis   . Carpal tunnel syndrome of right wrist   . Coronary atherosclerosis of unspecified type of vessel, native or graft   . Hand and foot pain   . Hyperlipidemia   . Hypertension   . MVP (mitral valve prolapse)   .  Sleep apnea   . Vertigo     PAST SURGICAL HISTORY: Past Surgical History:  Procedure Laterality Date  . BREAST BIOPSY Right 08/27/2018   [place] bx, [facility]  . BREAST LUMPECTOMY Right 09/11/2018   path pending  . BREAST LUMPECTOMY WITH NEEDLE LOCALIZATION AND AXILLARY SENTINEL LYMPH NODE BX Right 09/11/2018   Procedure: BREAST LUMPECTOMY WITH NEEDLE LOCALIZATION AND SENTINEL LYMPH NODE BX;  Surgeon: [name], MD;  Location: [facility];  Service: General;  Laterality: Right;  . CARDIAC CATHETERIZATION    . CATARACT EXTRACTION W/PHACO Left 01/01/2017   Procedure: CATARACT EXTRACTION PHACO AND INTRAOCULAR LENS PLACEMENT (IOC)left Restor lens;  Surgeon: Leandrew Koyanagi, MD;  Location: [place];  Service: Ophthalmology;  Laterality: Left;  Restor lens  . CATARACT EXTRACTION W/PHACO Right 01/31/2017   Procedure: CATARACT EXTRACTION PHACO AND INTRAOCULAR LENS PLACEMENT (IOC);  Surgeon: Leandrew Koyanagi, MD;  Location: [place];  Service: Ophthalmology;  Laterality: Right;  . CESAREAN SECTION    . CHOLECYSTECTOMY    . HYSTEROSCOPY  06/2010   D&C  . PORTACATH PLACEMENT Right 09/11/2018   Procedure: INSERTION PORT-A-CATH;  Surgeon: [name], MD;  Location: [facility];  Service: General;  Laterality: Right;  . TUBAL LIGATION      FAMILY HISTORY: Family History  Problem Relation Age of Onset  . Hypertension Mother   . Cancer Mother 65       LUNG  . Hypertension Father   . Breast cancer Neg Hx     ADVANCED DIRECTIVES (Y/N):  N  HEALTH MAINTENANCE: Social History   Tobacco Use  .  Smoking status: Never Smoker  . Smokeless tobacco: Never Used  Substance Use Topics  . Alcohol use: Yes    Alcohol/week: 3.0 standard drinks    Types: 3 Shots of liquor per week    Comment: social drinker  . Drug use: No     Colonoscopy:  PAP:  Bone density:  Lipid panel:  Allergies  Allergen Reactions  . Isosorbide Nitrate Other (See Comments)    headache     Current Outpatient Medications  Medication Sig Dispense Refill  . amLODipine-benazepril (LOTREL) 10-20 MG per capsule Take 1 capsule by mouth daily.     . Cholecalciferol (VITAMIN D3) 1.25 MG (50000 UT) CAPS     . diclofenac (VOLTAREN) 75 MG EC tablet Take 1 tablet (75 mg total) by mouth 2 (two) times daily. 60 tablet 0  . ezetimibe (ZETIA) 10 MG tablet     . fluticasone (FLONASE) 50 MCG/ACT nasal spray Place 2 sprays into both nostrils daily as needed for allergies.     . hydrochlorothiazide (MICROZIDE) 12.5 MG capsule Take 12.5 mg by mouth daily as needed (vertigo).     . LORazepam (ATIVAN) 0.5 MG tablet     . LORazepam (ATIVAN) 1 MG tablet Take 1 mg by mouth at bedtime as needed for anxiety or sleep.    . meclizine (ANTIVERT) 25 MG tablet Take 25 mg by mouth 3 (three) times daily as needed (Vertigo).     . metoprolol tartrate (LOPRESSOR) 100 MG tablet     . nitroGLYCERIN (NITROSTAT) 0.4 MG SL tablet Place 0.4 mg under the tongue every 5 (five) minutes as needed for chest pain.     . NON FORMULARY Inhale 1 application into the lungs at bedtime. CPAP    . omeprazole (PRILOSEC) 20 MG capsule Take 20 mg by mouth daily.     Marland Kitchen oxyCODONE-acetaminophen (PERCOCET/ROXICET) 5-325 MG tablet Take 1 tablet by mouth every 4 (four) hours as needed for severe pain. 30 tablet 0  . pravastatin (PRAVACHOL) 10 MG tablet     . venlafaxine XR (EFFEXOR-XR) 75 MG 24 hr capsule Take 75 mg by mouth daily with breakfast.     . omeprazole (PRILOSEC) 40 MG capsule      Current Facility-Administered Medications  Medication Dose Route Frequency Provider Last Rate Last Dose  . betamethasone acetate-betamethasone sodium phosphate (CELESTONE) injection 3 mg  3 mg Intramuscular Once Daylene Katayama M, DPM        OBJECTIVE: Vitals:   09/30/18 1040  BP: (!) 146/92  Pulse: 86     Body mass index is 35.78 kg/m.    ECOG FS:0 - Asymptomatic  General: Well-developed, well-nourished, no acute distress. Eyes: Pink  conjunctiva, anicteric sclera. HEENT: Normocephalic, moist mucous membranes. Breast: Well-healed surgical scar in right breast. Lungs: Clear to auscultation bilaterally. Heart: Regular rate and rhythm. No rubs, murmurs, or gallops. Abdomen: Soft, nontender, nondistended. No organomegaly noted, normoactive bowel sounds. Musculoskeletal: No edema, cyanosis, or clubbing. Neuro: Alert, answering all questions appropriately. Cranial nerves grossly intact. Skin: No rashes or petechiae noted. Psych: Normal affect.   LAB RESULTS:  Lab Results  Component Value Date   NA 141 09/09/2018   K 3.8 09/09/2018   CL 109 09/09/2018   CO2 26 09/09/2018   GLUCOSE 107 (H) 09/09/2018   BUN 14 09/09/2018   CREATININE 0.75 09/09/2018   CALCIUM 9.6 09/09/2018   PROT 7.5 11/17/2013   ALBUMIN 3.6 11/17/2013   AST 13 (L) 11/17/2013   ALT 18 11/17/2013  ALKPHOS 61 11/17/2013   BILITOT 0.3 11/17/2013   GFRNONAA >60 09/09/2018   GFRAA >60 09/09/2018    Lab Results  Component Value Date   WBC 6.3 09/09/2018   NEUTROABS 3.1 09/09/2018   HGB 13.4 09/09/2018   HCT 41.4 09/09/2018   MCV 94.1 09/09/2018   PLT 380 09/09/2018     STUDIES: Nm Cardiac Muga Rest  Result Date: 09/24/2018 CLINICAL DATA:  RIGHT breast cancer post lumpectomy, pre cardiotoxic chemotherapy EXAM: NUCLEAR MEDICINE CARDIAC BLOOD POOL IMAGING (MUGA) TECHNIQUE: Cardiac multi-gated acquisition was performed at rest following intravenous injection of Tc-36mlabeled red blood cells. RADIOPHARMACEUTICALS:  23.51 mCi Tc-916mertechnetate in-vitro labeled red blood cells IV COMPARISON:  None FINDINGS: Calculated LEFT ventricular ejection fraction is 62%, normal. Study was obtained at a cardiac rate of 75 bpm. Patient was rhythmic during imaging. Cine analysis of the LEFT ventricle in 3 projections demonstrates normal LEFT ventricular wall motion. IMPRESSION: Normal LEFT ventricular ejection fraction of 62%. Normal LV wall motion.  Electronically Signed   By: MaLavonia Dana.D.   On: 09/24/2018 16:17   Nm Sentinel Node Injection  Result Date: 09/11/2018 CLINICAL DATA:  Right breast cancer. EXAM: NUCLEAR MEDICINE BREAST LYMPHOSCINTIGRAPHY TECHNIQUE: Intradermal injection of radiopharmaceutical was performed at the 12 o'clock, 3 o'clock, 6 o'clock, and 9 o'clock positions around the right nipple. The patient was then sent to the operating room where the sentinel node(s) were identified and removed by the surgeon. RADIOPHARMACEUTICALS:  Total of 0.8.119illicuries Millipore-filtered Technetium-9951mlfur colloid, injected in four aliquots. IMPRESSION: Uncomplicated intradermal injection of a total of 0.71.478llicuries TecGNFAOZHYQM-57Qlfur colloid for purposes of sentinel node identification. Electronically Signed   By: ArtMarybelle KillingsD.   On: 09/11/2018 10:47   Mm Breast Surgical Specimen  Result Date: 09/11/2018 CLINICAL DATA:  [age]ar old female status post right breast lumpectomy. EXAM: SPECIMEN RADIOGRAPH OF THE RIGHT BREAST COMPARISON:  Previous exam(s). FINDINGS: Status post excision of the right breast. The wire tip and biopsy marker clip are present and are marked for pathology. IMPRESSION: Specimen radiograph of the right breast. Electronically Signed   By: SerKristopher OppenheimD.   On: 09/11/2018 11:57   Dg Chest Port 1 View  Result Date: 09/11/2018 CLINICAL DATA:  Follow-up chest port placement EXAM: PORTABLE CHEST 1 VIEW COMPARISON:  None. FINDINGS: Cardiac shadows within normal limits. The lungs are well aerated without focal pneumothorax. Right chest wall port is noted with catheter tip at the junction of the innominate veins. No bony abnormality is noted. IMPRESSION: Right chest wall port with catheter tip as described. No pneumothorax is noted. Electronically Signed   By: MarInez CatalinaD.   On: 09/11/2018 15:02   Mm Digital Diagnostic Unilat R  Result Date: 09/11/2018 CLINICAL DATA:  [age]ar old female with biopsy  proven right breast cancer. EXAM: NEEDLE LOCALIZATION OF THE RIGHT BREAST WITH ULTRASOUND GUIDANCE COMPARISON:  Previous exams. FINDINGS: Patient presents for needle localization prior to right breast lumpectomy. I met with the patient and we discussed the procedure of needle localization including benefits and alternatives. We discussed the high likelihood of a successful procedure. We discussed the risks of the procedure, including infection, bleeding, tissue injury, and further surgery. Informed, written consent was given. The usual time-out protocol was performed immediately prior to the procedure. Using ultrasound guidance, sterile technique, 1% lidocaine and a 7 cm modified Kopans needle, a mass at the 2 o'clock position 6 cm from the nipple localized using superior approach. The images were marked  for Dr. Rosana Hoes. IMPRESSION: Needle localization right breast. No apparent complications. Electronically Signed   By: Kristopher Oppenheim M.D.   On: 09/11/2018 09:14   Dg C-arm 1-60 Min-no Report  Result Date: 09/11/2018 Fluoroscopy was utilized by the requesting physician.  No radiographic interpretation.   [place] Rt Plc Breast Loc Dev   1st Lesion  Inc US Guide  Result Date: 09/11/2018 CLINICAL DATA:  58 year old female with biopsy proven right breast cancer. EXAM: NEEDLE LOCALIZATION OF THE RIGHT BREAST WITH ULTRASOUND GUIDANCE COMPARISON:  Previous exams. FINDINGS: Patient presents for needle localization prior to right breast lumpectomy. I met with the patient and we discussed the procedure of needle localization including benefits and alternatives. We discussed the high likelihood of a successful procedure. We discussed the risks of the procedure, including infection, bleeding, tissue injury, and further surgery. Informed, written consent was given. The usual time-out protocol was performed immediately prior to the procedure. Using ultrasound guidance, sterile technique, 1% lidocaine and a 7 cm modified Kopans  needle, a mass at the 2 o'clock position 6 cm from the nipple localized using superior approach. The images were marked for Dr. Rosana Hoes. IMPRESSION: Needle localization right breast. No apparent complications. Electronically Signed   By: Kristopher Oppenheim M.D.   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  She also had port placement at the same time. Given the fact the patient has HER-2 positive disease, she will require adjuvant chemotherapy with 12 weekly cycles of Taxol and Herceptin followed by maintenance Herceptin every 3 weeks for 12 months.  At the conclusion of her Taxol, patient will benefit from adjuvant XRT.  Patient will also benefit from an aromatase inhibitor for 5 years.  MUGA scan revealed an EF of 62%.  Return to clinic on October 10, 2018 to initiate cycle 1 of Taxol and Herceptin.  I spent a total of 30 minutes face-to-face with the patient of which greater than 50% of the visit was spent in counseling and coordination of care as detailed above.  Patient expressed understanding and was in agreement with this plan. She also understands that She can call clinic at any time with any questions, concerns, or complaints.   Cancer Staging Malignant neoplasm of upper-inner quadrant of right breast in female, estrogen receptor positive ([place]) Staging form: Breast, AJCC 8th Edition - Clinical stage from 09/06/2018: Stage IA (cT1c, cN0, cM0, G3, ER+, PR+, HER2+) - Signed by Lloyd Huger, MD on 09/06/2018   Lloyd Huger, MD   09/30/2018 12:32 PM

## 2018-09-30 ENCOUNTER — Other Ambulatory Visit: Payer: Self-pay

## 2018-09-30 ENCOUNTER — Inpatient Hospital Stay: Payer: BLUE CROSS/BLUE SHIELD | Attending: Oncology | Admitting: Oncology

## 2018-09-30 ENCOUNTER — Encounter: Payer: Self-pay | Admitting: *Deleted

## 2018-09-30 ENCOUNTER — Encounter: Payer: Self-pay | Admitting: Oncology

## 2018-09-30 VITALS — BP 146/92 | HR 86 | Wt 215.0 lb

## 2018-09-30 DIAGNOSIS — C50211 Malignant neoplasm of upper-inner quadrant of right female breast: Secondary | ICD-10-CM | POA: Diagnosis not present

## 2018-09-30 DIAGNOSIS — Z170 Estrogen receptor positive status [ER+]: Secondary | ICD-10-CM | POA: Diagnosis not present

## 2018-09-30 DIAGNOSIS — G4730 Sleep apnea, unspecified: Secondary | ICD-10-CM | POA: Diagnosis not present

## 2018-09-30 DIAGNOSIS — Z5111 Encounter for antineoplastic chemotherapy: Secondary | ICD-10-CM | POA: Insufficient documentation

## 2018-09-30 DIAGNOSIS — M1990 Unspecified osteoarthritis, unspecified site: Secondary | ICD-10-CM | POA: Insufficient documentation

## 2018-09-30 DIAGNOSIS — I341 Nonrheumatic mitral (valve) prolapse: Secondary | ICD-10-CM | POA: Insufficient documentation

## 2018-09-30 DIAGNOSIS — Z79899 Other long term (current) drug therapy: Secondary | ICD-10-CM | POA: Insufficient documentation

## 2018-09-30 DIAGNOSIS — E785 Hyperlipidemia, unspecified: Secondary | ICD-10-CM | POA: Insufficient documentation

## 2018-09-30 DIAGNOSIS — I10 Essential (primary) hypertension: Secondary | ICD-10-CM | POA: Diagnosis not present

## 2018-09-30 DIAGNOSIS — I2510 Atherosclerotic heart disease of native coronary artery without angina pectoris: Secondary | ICD-10-CM | POA: Insufficient documentation

## 2018-09-30 MED ORDER — PROCHLORPERAZINE MALEATE 10 MG PO TABS: 10.0000 mg | ORAL_TABLET | Freq: Four times a day (QID) | ORAL | 2 refills | Status: DC | PRN

## 2018-09-30 MED ORDER — ONDANSETRON HCL 8 MG PO TABS: 8.0000 mg | ORAL_TABLET | Freq: Two times a day (BID) | ORAL | 2 refills | Status: DC | PRN

## 2018-09-30 MED ORDER — LIDOCAINE-PRILOCAINE 2.5-2.5 % EX CREA: TOPICAL_CREAM | CUTANEOUS | 3 refills | Status: DC

## 2018-10-02 NOTE — Patient Instructions (Signed)
Paclitaxel injection What is this medicine? PACLITAXEL (PAK li TAX el) is a chemotherapy drug. It targets fast dividing cells, like cancer cells, and causes these cells to die. This medicine is used to treat ovarian cancer, breast cancer, lung cancer, Kaposi's sarcoma, and other cancers. This medicine may be used for other purposes; ask your health care provider or pharmacist if you have questions. COMMON BRAND NAME(S): Onxol, Taxol What should I tell my health care provider before I take this medicine? They need to know if you have any of these conditions: -history of irregular heartbeat -liver disease -low blood counts, like low white cell, platelet, or red cell counts -lung or breathing disease, like asthma -tingling of the fingers or toes, or other nerve disorder -an unusual or allergic reaction to paclitaxel, alcohol, polyoxyethylated castor oil, other chemotherapy, other medicines, foods, dyes, or preservatives -pregnant or trying to get pregnant -breast-feeding How should I use this medicine? This drug is given as an infusion into a vein. It is administered in a hospital or clinic by a specially trained health care professional. Talk to your pediatrician regarding the use of this medicine in children. Special care may be needed. Overdosage: If you think you have taken too much of this medicine contact a poison control center or emergency room at once. NOTE: This medicine is only for you. Do not share this medicine with others. What if I miss a dose? It is important not to miss your dose. Call your doctor or health care professional if you are unable to keep an appointment. What may interact with this medicine? Do not take this medicine with any of the following medications: -disulfiram -metronidazole This medicine may also interact with the following medications: -antiviral medicines for hepatitis, HIV or AIDS -certain antibiotics like erythromycin and clarithromycin -certain  medicines for fungal infections like ketoconazole and itraconazole -certain medicines for seizures like carbamazepine, phenobarbital, phenytoin -gemfibrozil -nefazodone -rifampin -St. John's wort This list may not describe all possible interactions. Give your health care provider a list of all the medicines, herbs, non-prescription drugs, or dietary supplements you use. Also tell them if you smoke, drink alcohol, or use illegal drugs. Some items may interact with your medicine. What should I watch for while using this medicine? Your condition will be monitored carefully while you are receiving this medicine. You will need important blood work done while you are taking this medicine. This medicine can cause serious allergic reactions. To reduce your risk you will need to take other medicine(s) before treatment with this medicine. If you experience allergic reactions like skin rash, itching or hives, swelling of the face, lips, or tongue, tell your doctor or health care professional right away. In some cases, you may be given additional medicines to help with side effects. Follow all directions for their use. This drug may make you feel generally unwell. This is not uncommon, as chemotherapy can affect healthy cells as well as cancer cells. Report any side effects. Continue your course of treatment even though you feel ill unless your doctor tells you to stop. Call your doctor or health care professional for advice if you get a fever, chills or sore throat, or other symptoms of a cold or flu. Do not treat yourself. This drug decreases your body's ability to fight infections. Try to avoid being around people who are sick. This medicine may increase your risk to bruise or bleed. Call your doctor or health care professional if you notice any unusual bleeding. Be careful brushing   your body's ability to fight infections. Try to avoid being around people who are sick.  This medicine may increase your risk to bruise or bleed. Call your doctor or health care professional if you notice any unusual bleeding.  Be careful brushing and flossing your teeth or using a toothpick because you may get an infection or bleed more easily. If you have any dental work  done, tell your dentist you are receiving this medicine.  Avoid taking products that contain aspirin, acetaminophen, ibuprofen, naproxen, or ketoprofen unless instructed by your doctor. These medicines may hide a fever.  Do not become pregnant while taking this medicine. Women should inform their doctor if they wish to become pregnant or think they might be pregnant. There is a potential for serious side effects to an unborn child. Talk to your health care professional or pharmacist for more information. Do not breast-feed an infant while taking this medicine.  Men are advised not to father a child while receiving this medicine.  This product may contain alcohol. Ask your pharmacist or healthcare provider if this medicine contains alcohol. Be sure to tell all healthcare providers you are taking this medicine. Certain medicines, like metronidazole and disulfiram, can cause an unpleasant reaction when taken with alcohol. The reaction includes flushing, headache, nausea, vomiting, sweating, and increased thirst. The reaction can last from 30 minutes to several hours.  What side effects may I notice from receiving this medicine?  Side effects that you should report to your doctor or health care professional as soon as possible:  -allergic reactions like skin rash, itching or hives, swelling of the face, lips, or tongue  -breathing problems  -changes in vision  -fast, irregular heartbeat  -high or low blood pressure  -mouth sores  -pain, tingling, numbness in the hands or feet  -signs of decreased platelets or bleeding - bruising, pinpoint red spots on the skin, black, tarry stools, blood in the urine  -signs of decreased red blood cells - unusually weak or tired, feeling faint or lightheaded, falls  -signs of infection - fever or chills, cough, sore throat, pain or difficulty passing urine  -signs and symptoms of liver injury like dark yellow or brown urine; general ill feeling or flu-like symptoms; light-colored  stools; loss of appetite; nausea; right upper belly pain; unusually weak or tired; yellowing of the eyes or skin  -swelling of the ankles, feet, hands  -unusually slow heartbeat  Side effects that usually do not require medical attention (report to your doctor or health care professional if they continue or are bothersome):  -diarrhea  -hair loss  -loss of appetite  -muscle or joint pain  -nausea, vomiting  -pain, redness, or irritation at site where injected  -tiredness  This list may not describe all possible side effects. Call your doctor for medical advice about side effects. You may report side effects to FDA at 1-800-FDA-1088.  Where should I keep my medicine?  This drug is given in a hospital or clinic and will not be stored at home.  NOTE: This sheet is a summary. It may not cover all possible information. If you have questions about this medicine, talk to your doctor, pharmacist, or health care provider.   2019 Elsevier/Gold Standard (2017-04-17 13:14:55)  Trastuzumab injection for infusion  What is this medicine?  TRASTUZUMAB (tras TOO zoo mab) is a monoclonal antibody. It is used to treat breast cancer and stomach cancer.  This medicine may be used for other purposes; ask your health care   provider or pharmacist if you have questions.  COMMON BRAND NAME(S): Herceptin, KANJINTI, OGIVRI  What should I tell my health care provider before I take this medicine?  They need to know if you have any of these conditions:  -heart disease  -heart failure  -lung or breathing disease, like asthma  -an unusual or allergic reaction to trastuzumab, benzyl alcohol, or other medications, foods, dyes, or preservatives  -pregnant or trying to get pregnant  -breast-feeding  How should I use this medicine?  This drug is given as an infusion into a vein. It is administered in a hospital or clinic by a specially trained health care professional.  Talk to your pediatrician regarding the use of this medicine in children. This  medicine is not approved for use in children.  Overdosage: If you think you have taken too much of this medicine contact a poison control center or emergency room at once.  NOTE: This medicine is only for you. Do not share this medicine with others.  What if I miss a dose?  It is important not to miss a dose. Call your doctor or health care professional if you are unable to keep an appointment.  What may interact with this medicine?  This medicine may interact with the following medications:  -certain types of chemotherapy, such as daunorubicin, doxorubicin, epirubicin, and idarubicin  This list may not describe all possible interactions. Give your health care provider a list of all the medicines, herbs, non-prescription drugs, or dietary supplements you use. Also tell them if you smoke, drink alcohol, or use illegal drugs. Some items may interact with your medicine.  What should I watch for while using this medicine?  Visit your doctor for checks on your progress. Report any side effects. Continue your course of treatment even though you feel ill unless your doctor tells you to stop.  Call your doctor or health care professional for advice if you get a fever, chills or sore throat, or other symptoms of a cold or flu. Do not treat yourself. Try to avoid being around people who are sick.  You may experience fever, chills and shaking during your first infusion. These effects are usually mild and can be treated with other medicines. Report any side effects during the infusion to your health care professional. Fever and chills usually do not happen with later infusions.  Do not become pregnant while taking this medicine or for 7 months after stopping it. Women should inform their doctor if they wish to become pregnant or think they might be pregnant. Women of child-bearing potential will need to have a negative pregnancy test before starting this medicine. There is a potential for serious side effects to an unborn  child. Talk to your health care professional or pharmacist for more information. Do not breast-feed an infant while taking this medicine or for 7 months after stopping it.  Women must use effective birth control with this medicine.  What side effects may I notice from receiving this medicine?  Side effects that you should report to your doctor or health care professional as soon as possible:  -allergic reactions like skin rash, itching or hives, swelling of the face, lips, or tongue  -chest pain or palpitations  -cough  -dizziness  -feeling faint or lightheaded, falls  -fever  -general ill feeling or flu-like symptoms  -signs of worsening heart failure like breathing problems; swelling in your legs and feet  -unusually weak or tired  Side effects that usually do not

## 2018-10-03 ENCOUNTER — Inpatient Hospital Stay: Payer: BLUE CROSS/BLUE SHIELD

## 2018-10-04 NOTE — Progress Notes (Signed)
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  INTERVAL HISTORY: Patient returns to clinic today for further evaluation and initiation of cycle 1 of weekly Taxol plus Herceptin.  She continues to feel well and remains asymptomatic. She has no neurologic complaints.  She denies any recent fevers or illnesses.  She has a good appetite and denies weight loss.  She has no chest pain or shortness of breath.  She denies any nausea, vomiting, constipation, or diarrhea.  She has no urinary complaints.  Patient feels at her baseline offers no specific complaints today.  REVIEW OF SYSTEMS:   Review of Systems  Constitutional: Negative.  Negative for fever, malaise/fatigue and weight loss.  Respiratory: Negative.  Negative for cough, hemoptysis and shortness of breath.   Cardiovascular: Negative.  Negative for chest pain and leg swelling.  Gastrointestinal: Negative.  Negative for abdominal pain, blood in stool and melena.  Genitourinary: Negative.  Negative for dysuria.  Musculoskeletal: Negative.  Negative for back pain.  Skin: Negative.  Negative for rash.  Neurological: Negative.  Negative for seizures, weakness and headaches.  Psychiatric/Behavioral: Negative.  The patient is not nervous/anxious.     As per HPI. Otherwise, a complete review of systems is negative.  PAST MEDICAL HISTORY: Past Medical History:  Diagnosis Date  . Anginal pain ([place])   . Arthritis   . Carpal tunnel syndrome of right wrist   . Coronary atherosclerosis of unspecified type of vessel, native or graft   . Hand and foot pain   . Hyperlipidemia     . Hypertension   . MVP (mitral valve prolapse)   . Sleep apnea   . Vertigo     PAST SURGICAL HISTORY: Past Surgical History:  Procedure Laterality Date  . BREAST BIOPSY Right 08/27/2018   [place] bx, [facility]  . BREAST LUMPECTOMY Right 09/11/2018   path pending  . BREAST LUMPECTOMY WITH NEEDLE LOCALIZATION AND AXILLARY SENTINEL LYMPH NODE BX Right 09/11/2018   Procedure: BREAST LUMPECTOMY WITH NEEDLE LOCALIZATION AND SENTINEL LYMPH NODE BX;  Surgeon: [name], MD;  Location: [facility];  Service: General;  Laterality: Right;  . CARDIAC CATHETERIZATION    . CATARACT EXTRACTION W/PHACO Left 01/01/2017   Procedure: CATARACT EXTRACTION PHACO AND INTRAOCULAR LENS PLACEMENT (IOC)left Restor lens;  Surgeon: Leandrew Koyanagi, MD;  Location: [facility];  Service: Ophthalmology;  Laterality: Left;  Restor lens  . CATARACT EXTRACTION W/PHACO Right 01/31/2017   Procedure: CATARACT EXTRACTION PHACO AND INTRAOCULAR LENS PLACEMENT (IOC);  Surgeon: Leandrew Koyanagi, MD;  Location: [facility];  Service: Ophthalmology;  Laterality: Right;  . CESAREAN SECTION    . CHOLECYSTECTOMY    . HYSTEROSCOPY  06/2010   D&C  . PORTACATH PLACEMENT Right 09/11/2018   Procedure: INSERTION PORT-A-CATH;  Surgeon: [name], MD;  Location: [facility];  Service: General;  Laterality: Right;  . TUBAL LIGATION      FAMILY HISTORY: Family History  Problem Relation Age of Onset  . Hypertension Mother   . Cancer Mother 81       LUNG  . Hypertension Father   . Breast cancer Neg Hx     ADVANCED DIRECTIVES (Y/N):  N  HEALTH MAINTENANCE: Social History   Tobacco Use  . Smoking status: Never Smoker  . Smokeless tobacco: Never Used  Substance Use Topics  . Alcohol use: Yes    Alcohol/week: 3.0 standard drinks    Types: 3 Shots of liquor per week    Comment: social drinker  . Drug use: No     Colonoscopy:  PAP:  Bone density:  Lipid panel:  Allergies  Allergen Reactions  .  Isosorbide Nitrate Other (See Comments)    headache    Current Outpatient Medications  Medication Sig Dispense Refill  . amLODipine-benazepril (LOTREL) 10-20 MG per capsule Take 1 capsule by mouth daily.     . Cholecalciferol (VITAMIN D3) 1.25 MG (50000 UT) CAPS     . diclofenac (VOLTAREN) 75 MG EC tablet Take 1 tablet (75 mg total) by mouth 2 (two) times daily. 60 tablet 0  . ezetimibe (ZETIA) 10 MG tablet     . fluticasone (FLONASE) 50 MCG/ACT nasal spray Place 2 sprays into both nostrils daily as needed for allergies.     . hydrochlorothiazide (MICROZIDE) 12.5 MG capsule Take 12.5 mg by mouth daily as needed (vertigo).     Marland Kitchen lidocaine-prilocaine (EMLA) cream Apply to affected area once 30 g 3  . LORazepam (ATIVAN) 0.5 MG tablet     . LORazepam (ATIVAN) 1 MG tablet Take 1 mg by mouth at bedtime as needed for anxiety or sleep.    . meclizine (ANTIVERT) 25 MG tablet Take 25 mg by mouth 3 (three) times daily as needed (Vertigo).     . metoprolol tartrate (LOPRESSOR) 100 MG tablet     . nitroGLYCERIN (NITROSTAT) 0.4 MG SL tablet Place 0.4 mg under the tongue every 5 (five) minutes as needed for chest pain.     . NON FORMULARY Inhale 1 application into the lungs at bedtime. CPAP    . omeprazole (PRILOSEC) 20 MG capsule Take 20 mg by mouth daily.     Marland Kitchen omeprazole (PRILOSEC) 40 MG capsule     . ondansetron (ZOFRAN) 8 MG tablet Take 1 tablet (8 mg total) by mouth 2 (two) times daily as needed (Nausea or vomiting). 30 tablet 2  . oxyCODONE-acetaminophen (PERCOCET/ROXICET) 5-325 MG tablet Take 1 tablet by mouth every 4 (four) hours as needed for severe pain. 30 tablet 0  . pravastatin (PRAVACHOL) 10 MG tablet     . prochlorperazine (COMPAZINE) 10 MG tablet Take 1 tablet (10 mg total) by mouth every 6 (six) hours as needed (Nausea or vomiting). 60 tablet 2  . venlafaxine XR (EFFEXOR-XR) 75 MG 24 hr capsule Take 75 mg by mouth daily with breakfast.      Current Facility-Administered Medications    Medication Dose Route Frequency Provider Last Rate Last Dose  . betamethasone acetate-betamethasone sodium phosphate (CELESTONE) injection 3 mg  3 mg Intramuscular Once Edrick Kins, DPM        OBJECTIVE: Vitals:   10/10/18 0935  BP: (!) 145/89  Pulse: 76  Temp: (!) 97.5 F (36.4 C)     Body mass index is 36.03 kg/m.    ECOG FS:0 - Asymptomatic  General: Well-developed, well-nourished, no acute distress. Eyes: Pink conjunctiva, anicteric sclera. HEENT: Normocephalic, moist mucous membranes, clear oropharnyx. Breast: Well-healed surgical scar in the right breast. Lungs: Clear to auscultation bilaterally. Heart: Regular rate and rhythm. No rubs, murmurs, or gallops. Abdomen: Soft, nontender, nondistended. No organomegaly noted, normoactive bowel sounds. Musculoskeletal: No edema, cyanosis, or clubbing. Neuro: Alert, answering all  questions appropriately. Cranial nerves grossly intact. Skin: No rashes or petechiae noted. Psych: Normal affect.   LAB RESULTS:  Lab Results  Component Value Date   NA 136 10/10/2018   K 3.7 10/10/2018   CL 106 10/10/2018   CO2 24 10/10/2018   GLUCOSE 127 (H) 10/10/2018   BUN 17 10/10/2018   CREATININE 0.68 10/10/2018   CALCIUM 9.2 10/10/2018   PROT 7.7 10/10/2018   ALBUMIN 3.8 10/10/2018   AST 23 10/10/2018   ALT 18 10/10/2018   ALKPHOS 105 10/10/2018   BILITOT 0.4 10/10/2018   GFRNONAA >60 10/10/2018   GFRAA >60 10/10/2018    Lab Results  Component Value Date   WBC 6.1 10/10/2018   NEUTROABS 2.8 10/10/2018   HGB 13.5 10/10/2018   HCT 41.0 10/10/2018   MCV 92.6 10/10/2018   PLT 326 10/10/2018     STUDIES: Nm Cardiac Muga Rest  Result Date: 09/24/2018 CLINICAL DATA:  RIGHT breast cancer post lumpectomy, pre cardiotoxic chemotherapy EXAM: NUCLEAR MEDICINE CARDIAC BLOOD POOL IMAGING (MUGA) TECHNIQUE: Cardiac multi-gated acquisition was performed at rest following intravenous injection of Tc-23mlabeled red blood cells.  RADIOPHARMACEUTICALS:  23.51 mCi Tc-912mertechnetate in-vitro labeled red blood cells IV COMPARISON:  None FINDINGS: Calculated LEFT ventricular ejection fraction is 62%, normal. Study was obtained at a cardiac rate of 75 bpm. Patient was rhythmic during imaging. Cine analysis of the LEFT ventricle in 3 projections demonstrates normal LEFT ventricular wall motion. IMPRESSION: Normal LEFT ventricular ejection fraction of 62%. Normal LV wall motion. Electronically Signed   By: MaLavonia Dana.D.   On: 09/24/2018 16:17   Mm Breast Surgical Specimen  Result Date: 09/11/2018 CLINICAL DATA:  [age]ear old female status post right breast lumpectomy. EXAM: SPECIMEN RADIOGRAPH OF THE RIGHT BREAST COMPARISON:  Previous exam(s). FINDINGS: Status post excision of the right breast. The wire tip and biopsy marker clip are present and are marked for pathology. IMPRESSION: Specimen radiograph of the right breast. Electronically Signed   By: SeKristopher Oppenheim.D.   On: 09/11/2018 11:57   Dg Chest Port 1 View  Result Date: 09/11/2018 CLINICAL DATA:  Follow-up chest port placement EXAM: PORTABLE CHEST 1 VIEW COMPARISON:  None. FINDINGS: Cardiac shadows within normal limits. The lungs are well aerated without focal pneumothorax. Right chest wall port is noted with catheter tip at the junction of the innominate veins. No bony abnormality is noted. IMPRESSION: Right chest wall port with catheter tip as described. No pneumothorax is noted. Electronically Signed   By: MaInez Catalina.D.   On: 09/11/2018 15:02   Dg C-arm 1-60 Min-no Report  Result Date: 09/11/2018 Fluoroscopy was utilized by the requesting physician.  No radiographic interpretation.    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  She also had port placement at the same time. Given the fact the patient has HER-2 positive disease, she will require adjuvant chemotherapy with 12 weekly cycles of Taxol and Herceptin followed by maintenance Herceptin every 3 weeks for 12 months.  At the conclusion of her Taxol, patient will benefit from adjuvant XRT.  Patient will also benefit from an aromatase inhibitor for 5 years.  MUGA scan on September 11, 2018 revealed an EF of 62%.  Proceed with cycle 1 of weekly Herceptin plus Taxol.  Return to clinic in 1 week for further evaluation and consideration of cycle 2.  I spent a total of 30 minutes face-to-face with the patient of which greater than 50% of the visit was spent in counseling and coordination of care as detailed above.   Patient expressed understanding and was in agreement with this plan. She also understands that She can call clinic at any time with any questions, concerns, or complaints.   Cancer Staging Malignant neoplasm of upper-inner quadrant of right breast in female, estrogen receptor positive ([place]) Staging form: Breast, AJCC 8th Edition - Clinical stage from 09/06/2018: Stage IA (cT1c, cN0, cM0, G3, ER+, PR+, HER2+) - Signed by Lloyd Huger, MD on 09/06/2018   Lloyd Huger, MD   10/11/2018 11:06 AM

## 2018-10-10 ENCOUNTER — Inpatient Hospital Stay (HOSPITAL_BASED_OUTPATIENT_CLINIC_OR_DEPARTMENT_OTHER): Payer: BLUE CROSS/BLUE SHIELD | Admitting: Oncology

## 2018-10-10 ENCOUNTER — Inpatient Hospital Stay: Payer: BLUE CROSS/BLUE SHIELD

## 2018-10-10 ENCOUNTER — Other Ambulatory Visit: Payer: Self-pay

## 2018-10-10 VITALS — BP 138/86 | HR 74 | Temp 99.1°F | Resp 20

## 2018-10-10 VITALS — BP 145/89 | HR 76 | Temp 97.5°F | Ht 65.0 in | Wt 216.5 lb

## 2018-10-10 DIAGNOSIS — Z79899 Other long term (current) drug therapy: Secondary | ICD-10-CM | POA: Diagnosis not present

## 2018-10-10 DIAGNOSIS — Z170 Estrogen receptor positive status [ER+]: Secondary | ICD-10-CM | POA: Diagnosis not present

## 2018-10-10 DIAGNOSIS — C50211 Malignant neoplasm of upper-inner quadrant of right female breast: Secondary | ICD-10-CM | POA: Diagnosis not present

## 2018-10-10 LAB — CBC WITH DIFFERENTIAL/PLATELET
Abs Immature Granulocytes: 0.01 10*3/uL (ref 0.00–0.07)
Basophils Absolute: 0.1 10*3/uL (ref 0.0–0.1)
Basophils Relative: 1 %
EOS ABS: 0.2 10*3/uL (ref 0.0–0.5)
Eosinophils Relative: 3 %
HCT: 41 % (ref 36.0–46.0)
Hemoglobin: 13.5 g/dL (ref 12.0–15.0)
Immature Granulocytes: 0 %
Lymphocytes Relative: 36 %
Lymphs Abs: 2.2 10*3/uL (ref 0.7–4.0)
MCH: 30.5 pg (ref 26.0–34.0)
MCHC: 32.9 g/dL (ref 30.0–36.0)
MCV: 92.6 fL (ref 80.0–100.0)
Monocytes Absolute: 0.8 10*3/uL (ref 0.1–1.0)
Monocytes Relative: 14 %
Neutro Abs: 2.8 10*3/uL (ref 1.7–7.7)
Neutrophils Relative %: 46 %
Platelets: 326 10*3/uL (ref 150–400)
RBC: 4.43 MIL/uL (ref 3.87–5.11)
RDW: 12.9 % (ref 11.5–15.5)
WBC: 6.1 10*3/uL (ref 4.0–10.5)
nRBC: 0 % (ref 0.0–0.2)

## 2018-10-10 LAB — COMPREHENSIVE METABOLIC PANEL
ALT: 18 U/L (ref 0–44)
AST: 23 U/L (ref 15–41)
Albumin: 3.8 g/dL (ref 3.5–5.0)
Alkaline Phosphatase: 105 U/L (ref 38–126)
Anion gap: 6 (ref 5–15)
BUN: 17 mg/dL (ref 6–20)
CALCIUM: 9.2 mg/dL (ref 8.9–10.3)
CO2: 24 mmol/L (ref 22–32)
CREATININE: 0.68 mg/dL (ref 0.44–1.00)
Chloride: 106 mmol/L (ref 98–111)
GFR calc Af Amer: >60 mL/min (ref >60)
GFR calc non Af Amer: >60 mL/min (ref >60)
Glucose, Bld: 127 mg/dL — ABNORMAL HIGH (ref 70–99)
Potassium: 3.7 mmol/L (ref 3.5–5.1)
Sodium: 136 mmol/L (ref 135–145)
Total Bilirubin: 0.4 mg/dL (ref 0.3–1.2)
Total Protein: 7.7 g/dL (ref 6.5–8.1)

## 2018-10-10 MED ORDER — HEPARIN SOD (PORK) LOCK FLUSH 100 UNIT/ML IV SOLN
500.0000 [IU] | Freq: Once | INTRAVENOUS | Status: AC
  Administered 2018-10-10: 500 [IU] via INTRAVENOUS
  Filled 2018-10-10: qty 5

## 2018-10-10 MED ORDER — SODIUM CHLORIDE 0.9% FLUSH
10.0000 mL | INTRAVENOUS | Status: DC | PRN
  Administered 2018-10-10: 10 mL via INTRAVENOUS
  Filled 2018-10-10: qty 10

## 2018-10-10 MED ORDER — DEXAMETHASONE SODIUM PHOSPHATE 10 MG/ML IJ SOLN
10.0000 mg | Freq: Once | INTRAMUSCULAR | Status: AC
  Administered 2018-10-10: 10 mg via INTRAVENOUS
  Filled 2018-10-10: qty 1

## 2018-10-10 MED ORDER — FAMOTIDINE IN NACL 20-0.9 MG/50ML-% IV SOLN
20.0000 mg | Freq: Once | INTRAVENOUS | Status: AC
  Administered 2018-10-10: 20 mg via INTRAVENOUS
  Filled 2018-10-10: qty 50

## 2018-10-10 MED ORDER — DIPHENHYDRAMINE HCL 50 MG/ML IJ SOLN
25.0000 mg | Freq: Once | INTRAMUSCULAR | Status: AC
  Administered 2018-10-10: 25 mg via INTRAVENOUS
  Filled 2018-10-10: qty 1

## 2018-10-10 MED ORDER — TRASTUZUMAB CHEMO 150 MG IV SOLR
4.0000 mg/kg | Freq: Once | INTRAVENOUS | Status: AC
  Administered 2018-10-10: 399 mg via INTRAVENOUS
  Filled 2018-10-10: qty 19

## 2018-10-10 MED ORDER — SODIUM CHLORIDE 0.9 % IV SOLN
80.0000 mg/m2 | Freq: Once | INTRAVENOUS | Status: AC
  Administered 2018-10-10: 168 mg via INTRAVENOUS
  Filled 2018-10-10: qty 28

## 2018-10-10 MED ORDER — ACETAMINOPHEN 325 MG PO TABS
650.0000 mg | ORAL_TABLET | Freq: Once | ORAL | Status: AC
  Administered 2018-10-10: 650 mg via ORAL
  Filled 2018-10-10: qty 2

## 2018-10-10 MED ORDER — SODIUM CHLORIDE 0.9 % IV SOLN
Freq: Once | INTRAVENOUS | Status: AC
  Administered 2018-10-10: 11:00:00 via INTRAVENOUS
  Filled 2018-10-10: qty 250

## 2018-10-10 NOTE — Progress Notes (Signed)
Patient is here today to follow up on her malignant neoplasm of upper-inner quadrant of right breast and to start her treatment. Patient stated that she had been doing well. Patient had her port-a-cath placed on 09/11/2018 and she stated that she had been doing well with it.

## 2018-10-13 NOTE — Progress Notes (Signed)
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  INTERVAL HISTORY: Patient returns to clinic today for further evaluation and consideration of cycle 2 of 12 on weekly Taxol plus Herceptin.  She tolerated her first infusion well without significant side effects.  She had a difficult week secondary to the sudden unexpected death of her brother.  She otherwise feels well. She has no neurologic complaints.  She denies any recent fevers or illnesses.  She has a good appetite and denies weight loss.  She has no chest pain or shortness of breath.  She denies any nausea, vomiting, constipation, or diarrhea.  She has no urinary complaints. Patient offers no specific complaints today.  REVIEW OF SYSTEMS:   Review of Systems  Constitutional: Negative.  Negative for fever, malaise/fatigue and weight loss.  Respiratory: Negative.  Negative for cough, hemoptysis and shortness of breath.   Cardiovascular: Negative.  Negative for chest pain and leg swelling.  Gastrointestinal: Negative.  Negative for abdominal pain, blood in stool and melena.  Genitourinary: Negative.  Negative for dysuria.  Musculoskeletal: Negative.  Negative for back pain.  Skin: Negative.  Negative for rash.  Neurological: Negative.  Negative for seizures, weakness and headaches.  Psychiatric/Behavioral: Negative.  The patient is not nervous/anxious.     As per HPI. Otherwise, a complete review of systems is negative.  PAST MEDICAL HISTORY: Past Medical History:  Diagnosis Date  . Anginal pain ([place])   . Arthritis   . Carpal tunnel syndrome of right wrist   .  Coronary atherosclerosis of unspecified type of vessel, native or graft   . Hand and foot pain   . Hyperlipidemia   . Hypertension   . MVP (mitral valve prolapse)   . Sleep apnea   . Vertigo     PAST SURGICAL HISTORY: Past Surgical History:  Procedure Laterality Date  . BREAST BIOPSY Right 08/27/2018   [place] bx, IMC  . BREAST LUMPECTOMY Right 09/11/2018   path pending  . BREAST LUMPECTOMY WITH NEEDLE LOCALIZATION AND AXILLARY SENTINEL LYMPH NODE BX Right 09/11/2018   Procedure: BREAST LUMPECTOMY WITH NEEDLE LOCALIZATION AND SENTINEL LYMPH NODE BX;  Surgeon: [name], MD;  Location: [facility];  Service: General;  Laterality: Right;  . CARDIAC CATHETERIZATION    . CATARACT EXTRACTION W/PHACO Left 01/01/2017   Procedure: CATARACT EXTRACTION PHACO AND INTRAOCULAR LENS PLACEMENT (IOC)left Restor lens;  Surgeon: Leandrew Koyanagi, MD;  Location: [facility];  Service: Ophthalmology;  Laterality: Left;  Restor lens  . CATARACT EXTRACTION W/PHACO Right 01/31/2017   Procedure: CATARACT EXTRACTION PHACO AND INTRAOCULAR LENS PLACEMENT (IOC);  Surgeon: Leandrew Koyanagi, MD;  Location: [place];  Service: Ophthalmology;  Laterality: Right;  . CESAREAN SECTION    . CHOLECYSTECTOMY    . HYSTEROSCOPY  06/2010   D&C  . PORTACATH PLACEMENT Right 09/11/2018   Procedure: INSERTION PORT-A-CATH;  Surgeon: [name], MD;  Location: [facility];  Service: General;  Laterality: Right;  . TUBAL LIGATION      FAMILY HISTORY: Family History  Problem Relation Age of Onset  . Hypertension Mother   . Cancer Mother 74       LUNG  .  Hypertension Father   . Breast cancer Neg Hx     ADVANCED DIRECTIVES (Y/N):  N  HEALTH MAINTENANCE: Social History   Tobacco Use  . Smoking status: Never Smoker  . Smokeless tobacco: Never Used  Substance Use Topics  . Alcohol use: Yes    Alcohol/week: 3.0 standard drinks    Types: 3 Shots of liquor per week    Comment: social drinker    . Drug use: No     Colonoscopy:  PAP:  Bone density:  Lipid panel:  Allergies  Allergen Reactions  . Isosorbide Nitrate Other (See Comments)    headache    Current Outpatient Medications  Medication Sig Dispense Refill  . amLODipine-benazepril (LOTREL) 10-20 MG per capsule Take 1 capsule by mouth daily.     . Cholecalciferol (VITAMIN D3) 1.25 MG (50000 UT) CAPS     . diclofenac (VOLTAREN) 75 MG EC tablet Take 1 tablet (75 mg total) by mouth 2 (two) times daily. 60 tablet 0  . ezetimibe (ZETIA) 10 MG tablet     . fluticasone (FLONASE) 50 MCG/ACT nasal spray Place 2 sprays into both nostrils daily as needed for allergies.     . hydrochlorothiazide (MICROZIDE) 12.5 MG capsule Take 12.5 mg by mouth daily as needed (vertigo).     Marland Kitchen lidocaine-prilocaine (EMLA) cream Apply to affected area once 30 g 3  . LORazepam (ATIVAN) 0.5 MG tablet     . LORazepam (ATIVAN) 1 MG tablet Take 1 mg by mouth at bedtime as needed for anxiety or sleep.    . meclizine (ANTIVERT) 25 MG tablet Take 25 mg by mouth 3 (three) times daily as needed (Vertigo).     . metoprolol tartrate (LOPRESSOR) 100 MG tablet     . nitroGLYCERIN (NITROSTAT) 0.4 MG SL tablet Place 0.4 mg under the tongue every 5 (five) minutes as needed for chest pain.     . NON FORMULARY Inhale 1 application into the lungs at bedtime. CPAP    . omeprazole (PRILOSEC) 20 MG capsule Take 20 mg by mouth daily.     Marland Kitchen omeprazole (PRILOSEC) 40 MG capsule     . ondansetron (ZOFRAN) 8 MG tablet Take 1 tablet (8 mg total) by mouth 2 (two) times daily as needed (Nausea or vomiting). 30 tablet 2  . oxyCODONE-acetaminophen (PERCOCET/ROXICET) 5-325 MG tablet Take 1 tablet by mouth every 4 (four) hours as needed for severe pain. 30 tablet 0  . pravastatin (PRAVACHOL) 10 MG tablet     . prochlorperazine (COMPAZINE) 10 MG tablet Take 1 tablet (10 mg total) by mouth every 6 (six) hours as needed (Nausea or vomiting). 60 tablet 2  . venlafaxine XR (EFFEXOR-XR) 75  MG 24 hr capsule Take 75 mg by mouth daily with breakfast.      Current Facility-Administered Medications  Medication Dose Route Frequency Provider Last Rate Last Dose  . betamethasone acetate-betamethasone sodium phosphate (CELESTONE) injection 3 mg  3 mg Intramuscular Once Edrick Kins, DPM       Facility-Administered Medications Ordered in Other Visits  Medication Dose Route Frequency Provider Last Rate Last Dose  . heparin lock flush 100 unit/mL  500 Units Intravenous Once Lloyd Huger, MD        OBJECTIVE: Vitals:   10/17/18 0824  BP: (!) 173/89  Pulse: 78  Temp: (!) 97.3 F (36.3 C)     Body mass index is 36.28 kg/m.    ECOG FS:0 - Asymptomatic  General: Well-developed, well-nourished, no acute distress.  Eyes: Pink conjunctiva, anicteric sclera. HEENT: Normocephalic, moist mucous membranes. Breast: Well-healed surgical scar in the right breast. Lungs: Clear to auscultation bilaterally. Heart: Regular rate and rhythm. No rubs, murmurs, or gallops. Abdomen: Soft, nontender, nondistended. No organomegaly noted, normoactive bowel sounds. Musculoskeletal: No edema, cyanosis, or clubbing. Neuro: Alert, answering all questions appropriately. Cranial nerves grossly intact. Skin: No rashes or petechiae noted. Psych: Normal affect.   LAB RESULTS:  Lab Results  Component Value Date   NA 140 10/17/2018   K 3.5 10/17/2018   CL 109 10/17/2018   CO2 23 10/17/2018   GLUCOSE 111 (H) 10/17/2018   BUN 16 10/17/2018   CREATININE 0.80 10/17/2018   CALCIUM 8.9 10/17/2018   PROT 7.1 10/17/2018   ALBUMIN 3.3 (L) 10/17/2018   AST 23 10/17/2018   ALT 27 10/17/2018   ALKPHOS 84 10/17/2018   BILITOT 0.4 10/17/2018   GFRNONAA >60 10/17/2018   GFRAA >60 10/17/2018    Lab Results  Component Value Date   WBC 5.3 10/17/2018   NEUTROABS 3.0 10/17/2018   HGB 11.9 (L) 10/17/2018   HCT 36.1 10/17/2018   MCV 92.6 10/17/2018   PLT 321 10/17/2018     STUDIES: Nm Cardiac  [facility] Rest  Result Date: 09/24/2018 CLINICAL DATA:  RIGHT breast cancer post lumpectomy, pre cardiotoxic chemotherapy EXAM: NUCLEAR MEDICINE CARDIAC BLOOD POOL IMAGING (MUGA) TECHNIQUE: Cardiac multi-gated acquisition was performed at rest following intravenous injection of Tc-56mlabeled red blood cells. RADIOPHARMACEUTICALS:  23.51 mCi Tc-924mertechnetate in-vitro labeled red blood cells IV COMPARISON:  None FINDINGS: Calculated LEFT ventricular ejection fraction is 62%, normal. Study was obtained at a cardiac rate of 75 bpm. Patient was rhythmic during imaging. Cine analysis of the LEFT ventricle in 3 projections demonstrates normal LEFT ventricular wall motion. IMPRESSION: Normal LEFT ventricular ejection fraction of 62%. Normal LV wall motion. Electronically Signed   By: MaLavonia Dana.D.   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  She also had port placement at the same time. Given the fact the patient has HER-2 positive disease, she will require adjuvant chemotherapy with 12 weekly cycles of Taxol and Herceptin followed by maintenance Herceptin every 3 weeks for 12 months.  At the conclusion of her Taxol, patient will benefit from adjuvant XRT.  Patient will also benefit from an aromatase inhibitor for 5 years.  MUGA scan on September 11, 2018 revealed an EF of 62%.  Proceed with cycle 2 of 12 of weekly Herceptin and Taxol today. Return to clinic in one week for lab and treatment only and then in 2 weeks for further evaluation and consideration of cycle 4.   I spent a total of 30 minutes face-to-face with the patient of which greater than 50% of the visit was spent in counseling and  coordination of care as detailed above.   Patient expressed understanding and was in agreement with this plan. She also understands that She can call clinic at any time with any questions, concerns, or complaints.   Cancer Staging Malignant neoplasm of upper-inner quadrant of right breast in female, estrogen receptor positive ([place] Torres[place]aging form: Breast, AJCC 8th Edition - Clinical stage from 09/06/2018: Stage IA (cT1c, cN0,  cM0, G3, ER+, PR+, HER2+) - Signed by Lloyd Huger, MD on 09/06/2018   Lloyd Huger, MD   10/17/2018 9:19 AM

## 2018-10-17 ENCOUNTER — Other Ambulatory Visit: Payer: Self-pay

## 2018-10-17 ENCOUNTER — Inpatient Hospital Stay (HOSPITAL_BASED_OUTPATIENT_CLINIC_OR_DEPARTMENT_OTHER): Payer: BLUE CROSS/BLUE SHIELD | Admitting: Oncology

## 2018-10-17 ENCOUNTER — Inpatient Hospital Stay: Payer: BLUE CROSS/BLUE SHIELD

## 2018-10-17 VITALS — BP 173/89 | HR 78 | Temp 97.3°F | Ht 65.0 in | Wt 218.0 lb

## 2018-10-17 DIAGNOSIS — C50211 Malignant neoplasm of upper-inner quadrant of right female breast: Secondary | ICD-10-CM | POA: Diagnosis not present

## 2018-10-17 DIAGNOSIS — Z79899 Other long term (current) drug therapy: Secondary | ICD-10-CM

## 2018-10-17 DIAGNOSIS — Z170 Estrogen receptor positive status [ER+]: Secondary | ICD-10-CM | POA: Diagnosis not present

## 2018-10-17 LAB — COMPREHENSIVE METABOLIC PANEL
ALT: 27 U/L (ref 0–44)
AST: 23 U/L (ref 15–41)
Albumin: 3.3 g/dL — ABNORMAL LOW (ref 3.5–5.0)
Alkaline Phosphatase: 84 U/L (ref 38–126)
Anion gap: 8 (ref 5–15)
BILIRUBIN TOTAL: 0.4 mg/dL (ref 0.3–1.2)
BUN: 16 mg/dL (ref 6–20)
CO2: 23 mmol/L (ref 22–32)
Calcium: 8.9 mg/dL (ref 8.9–10.3)
Chloride: 109 mmol/L (ref 98–111)
Creatinine, Ser: 0.8 mg/dL (ref 0.44–1.00)
GFR calc Af Amer: >60 mL/min (ref >60)
Glucose, Bld: 111 mg/dL — ABNORMAL HIGH (ref 70–99)
Potassium: 3.5 mmol/L (ref 3.5–5.1)
Sodium: 140 mmol/L (ref 135–145)
Total Protein: 7.1 g/dL (ref 6.5–8.1)

## 2018-10-17 LAB — CBC WITH DIFFERENTIAL/PLATELET
Abs Immature Granulocytes: 0.03 10*3/uL (ref 0.00–0.07)
Basophils Absolute: 0.1 10*3/uL (ref 0.0–0.1)
Basophils Relative: 2 %
EOS ABS: 0.2 10*3/uL (ref 0.0–0.5)
Eosinophils Relative: 4 %
HCT: 36.1 % (ref 36.0–46.0)
Hemoglobin: 11.9 g/dL — ABNORMAL LOW (ref 12.0–15.0)
Immature Granulocytes: 1 %
Lymphocytes Relative: 29 %
Lymphs Abs: 1.5 10*3/uL (ref 0.7–4.0)
MCH: 30.5 pg (ref 26.0–34.0)
MCHC: 33 g/dL (ref 30.0–36.0)
MCV: 92.6 fL (ref 80.0–100.0)
Monocytes Absolute: 0.5 10*3/uL (ref 0.1–1.0)
Monocytes Relative: 9 %
Neutro Abs: 3 10*3/uL (ref 1.7–7.7)
Neutrophils Relative %: 55 %
Platelets: 321 10*3/uL (ref 150–400)
RBC: 3.9 MIL/uL (ref 3.87–5.11)
RDW: 12.5 % (ref 11.5–15.5)
WBC: 5.3 10*3/uL (ref 4.0–10.5)
nRBC: 0 % (ref 0.0–0.2)

## 2018-10-17 MED ORDER — FAMOTIDINE IN NACL 20-0.9 MG/50ML-% IV SOLN
20.0000 mg | Freq: Once | INTRAVENOUS | Status: AC
  Administered 2018-10-17: 20 mg via INTRAVENOUS
  Filled 2018-10-17: qty 50

## 2018-10-17 MED ORDER — SODIUM CHLORIDE 0.9 % IV SOLN
Freq: Once | INTRAVENOUS | Status: AC
  Administered 2018-10-17: 10:00:00 via INTRAVENOUS
  Filled 2018-10-17: qty 250

## 2018-10-17 MED ORDER — ACETAMINOPHEN 325 MG PO TABS
650.0000 mg | ORAL_TABLET | Freq: Once | ORAL | Status: AC
  Administered 2018-10-17: 650 mg via ORAL
  Filled 2018-10-17: qty 2

## 2018-10-17 MED ORDER — DIPHENHYDRAMINE HCL 50 MG/ML IJ SOLN
25.0000 mg | Freq: Once | INTRAMUSCULAR | Status: AC
  Administered 2018-10-17: 25 mg via INTRAVENOUS
  Filled 2018-10-17: qty 1

## 2018-10-17 MED ORDER — HEPARIN SOD (PORK) LOCK FLUSH 100 UNIT/ML IV SOLN
500.0000 [IU] | Freq: Once | INTRAVENOUS | Status: AC
  Administered 2018-10-17: 500 [IU] via INTRAVENOUS
  Filled 2018-10-17: qty 5

## 2018-10-17 MED ORDER — SODIUM CHLORIDE 0.9 % IV SOLN
80.0000 mg/m2 | Freq: Once | INTRAVENOUS | Status: AC
  Administered 2018-10-17: 168 mg via INTRAVENOUS
  Filled 2018-10-17: qty 28

## 2018-10-17 MED ORDER — TRASTUZUMAB CHEMO 150 MG IV SOLR
2.0000 mg/kg | Freq: Once | INTRAVENOUS | Status: AC
  Administered 2018-10-17: 189 mg via INTRAVENOUS
  Filled 2018-10-17: qty 9

## 2018-10-17 MED ORDER — DEXAMETHASONE SODIUM PHOSPHATE 10 MG/ML IJ SOLN
10.0000 mg | Freq: Once | INTRAMUSCULAR | Status: AC
  Administered 2018-10-17: 10 mg via INTRAVENOUS
  Filled 2018-10-17: qty 1

## 2018-10-17 MED ORDER — SODIUM CHLORIDE 0.9% FLUSH
10.0000 mL | Freq: Once | INTRAVENOUS | Status: AC
  Administered 2018-10-17: 10 mL via INTRAVENOUS
  Filled 2018-10-17: qty 10

## 2018-10-17 NOTE — Progress Notes (Signed)
Patient is here today to follow up on her malignant neoplasm of upper-inner quadrant of right breast. Patient stated that she had been doing well with no complaints. Patient denied fever, chills, nausea, vomiting, diarrhea, constipation and poor appetite.

## 2018-10-24 ENCOUNTER — Inpatient Hospital Stay: Payer: BLUE CROSS/BLUE SHIELD

## 2018-10-24 VITALS — BP 147/81 | HR 80 | Temp 97.6°F | Resp 18 | Wt 219.8 lb

## 2018-10-24 DIAGNOSIS — Z170 Estrogen receptor positive status [ER+]: Principal | ICD-10-CM

## 2018-10-24 DIAGNOSIS — C50211 Malignant neoplasm of upper-inner quadrant of right female breast: Secondary | ICD-10-CM | POA: Diagnosis not present

## 2018-10-24 LAB — COMPREHENSIVE METABOLIC PANEL
ALBUMIN: 3.3 g/dL — AB (ref 3.5–5.0)
ALT: 21 U/L (ref 0–44)
AST: 22 U/L (ref 15–41)
Alkaline Phosphatase: 87 U/L (ref 38–126)
Anion gap: 7 (ref 5–15)
BILIRUBIN TOTAL: 0.4 mg/dL (ref 0.3–1.2)
BUN: 21 mg/dL — AB (ref 6–20)
CO2: 22 mmol/L (ref 22–32)
Calcium: 8.6 mg/dL — ABNORMAL LOW (ref 8.9–10.3)
Chloride: 105 mmol/L (ref 98–111)
Creatinine, Ser: 0.77 mg/dL (ref 0.44–1.00)
GFR calc Af Amer: >60 mL/min (ref >60)
GFR calc non Af Amer: >60 mL/min (ref >60)
Glucose, Bld: 104 mg/dL — ABNORMAL HIGH (ref 70–99)
Potassium: 3.6 mmol/L (ref 3.5–5.1)
Sodium: 134 mmol/L — ABNORMAL LOW (ref 135–145)
Total Protein: 7.1 g/dL (ref 6.5–8.1)

## 2018-10-24 LAB — CBC WITH DIFFERENTIAL/PLATELET
ABS IMMATURE GRANULOCYTES: 0.05 10*3/uL (ref 0.00–0.07)
Basophils Absolute: 0.1 10*3/uL (ref 0.0–0.1)
Basophils Relative: 2 %
Eosinophils Absolute: 0.1 10*3/uL (ref 0.0–0.5)
Eosinophils Relative: 3 %
HCT: 37.4 % (ref 36.0–46.0)
Hemoglobin: 12.2 g/dL (ref 12.0–15.0)
Immature Granulocytes: 1 %
Lymphocytes Relative: 38 %
Lymphs Abs: 1.8 10*3/uL (ref 0.7–4.0)
MCH: 30.2 pg (ref 26.0–34.0)
MCHC: 32.6 g/dL (ref 30.0–36.0)
MCV: 92.6 fL (ref 80.0–100.0)
MONOS PCT: 12 %
Monocytes Absolute: 0.6 10*3/uL (ref 0.1–1.0)
Neutro Abs: 2.1 10*3/uL (ref 1.7–7.7)
Neutrophils Relative %: 44 %
PLATELETS: 362 10*3/uL (ref 150–400)
RBC: 4.04 MIL/uL (ref 3.87–5.11)
RDW: 13 % (ref 11.5–15.5)
WBC: 4.8 10*3/uL (ref 4.0–10.5)
nRBC: 0 % (ref 0.0–0.2)

## 2018-10-24 MED ORDER — ACETAMINOPHEN 325 MG PO TABS
650.0000 mg | ORAL_TABLET | Freq: Once | ORAL | Status: AC
  Administered 2018-10-24: 650 mg via ORAL
  Filled 2018-10-24: qty 2

## 2018-10-24 MED ORDER — DIPHENHYDRAMINE HCL 50 MG/ML IJ SOLN
25.0000 mg | Freq: Once | INTRAMUSCULAR | Status: AC
  Administered 2018-10-24: 25 mg via INTRAVENOUS
  Filled 2018-10-24: qty 1

## 2018-10-24 MED ORDER — HEPARIN SOD (PORK) LOCK FLUSH 100 UNIT/ML IV SOLN
500.0000 [IU] | Freq: Once | INTRAVENOUS | Status: AC | PRN
  Administered 2018-10-24: 500 [IU]
  Filled 2018-10-24: qty 5

## 2018-10-24 MED ORDER — SODIUM CHLORIDE 0.9% FLUSH
10.0000 mL | INTRAVENOUS | Status: DC | PRN
  Administered 2018-10-24: 10 mL
  Filled 2018-10-24: qty 10

## 2018-10-24 MED ORDER — FAMOTIDINE IN NACL 20-0.9 MG/50ML-% IV SOLN
20.0000 mg | Freq: Once | INTRAVENOUS | Status: AC
  Administered 2018-10-24: 20 mg via INTRAVENOUS
  Filled 2018-10-24: qty 50

## 2018-10-24 MED ORDER — SODIUM CHLORIDE 0.9 % IV SOLN
80.0000 mg/m2 | Freq: Once | INTRAVENOUS | Status: AC
  Administered 2018-10-24: 168 mg via INTRAVENOUS
  Filled 2018-10-24: qty 28

## 2018-10-24 MED ORDER — DEXAMETHASONE SODIUM PHOSPHATE 10 MG/ML IJ SOLN
10.0000 mg | Freq: Once | INTRAMUSCULAR | Status: AC
  Administered 2018-10-24: 10 mg via INTRAVENOUS
  Filled 2018-10-24: qty 1

## 2018-10-24 MED ORDER — SODIUM CHLORIDE 0.9 % IV SOLN
Freq: Once | INTRAVENOUS | Status: AC
  Administered 2018-10-24: 09:00:00 via INTRAVENOUS
  Filled 2018-10-24: qty 250

## 2018-10-24 MED ORDER — TRASTUZUMAB CHEMO 150 MG IV SOLR
2.0000 mg/kg | Freq: Once | INTRAVENOUS | Status: AC
  Administered 2018-10-24: 189 mg via INTRAVENOUS
  Filled 2018-10-24: qty 9

## 2018-10-28 NOTE — Progress Notes (Signed)
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  INTERVAL HISTORY: Patient returns to clinic today for further evaluation and consideration of cycle 4 of 12 of weekly Taxol plus Herceptin.  She is tolerating her treatments well without significant side effects.  She currently feels well and is asymptomatic. She has no neurologic complaints.  She denies any recent fevers or illnesses.  She has a good appetite and denies weight loss.  She has no chest pain or shortness of breath.  She denies any nausea, vomiting, constipation, or diarrhea.  She has no urinary complaints.  Patient feels at her baseline offers no specific complaints today.  REVIEW OF SYSTEMS:   Review of Systems  Constitutional: Negative.  Negative for fever, malaise/fatigue and weight loss.  Respiratory: Negative.  Negative for cough, hemoptysis and shortness of breath.   Cardiovascular: Negative.  Negative for chest pain and leg swelling.  Gastrointestinal: Negative.  Negative for abdominal pain, blood in stool and melena.  Genitourinary: Negative.  Negative for dysuria.  Musculoskeletal: Negative.  Negative for back pain.  Skin: Negative.  Negative for rash.  Neurological: Negative.  Negative for seizures, weakness and headaches.  Psychiatric/Behavioral: Negative.  The patient is not nervous/anxious.     As per HPI. Otherwise, a complete review of systems is negative.  PAST MEDICAL HISTORY: Past Medical History:  Diagnosis Date  . Anginal pain ([place])   . Arthritis   . Carpal tunnel syndrome of right wrist   . Coronary atherosclerosis of unspecified  type of vessel, native or graft   . Hand and foot pain   . Hyperlipidemia   . Hypertension   . MVP (mitral valve prolapse)   . Sleep apnea   . Vertigo     PAST SURGICAL HISTORY: Past Surgical History:  Procedure Laterality Date  . BREAST BIOPSY Right 08/27/2018   [place] bx, IMC  . BREAST LUMPECTOMY Right 09/11/2018   path pending  . BREAST LUMPECTOMY WITH NEEDLE LOCALIZATION AND AXILLARY SENTINEL LYMPH NODE BX Right 09/11/2018   Procedure: BREAST LUMPECTOMY WITH NEEDLE LOCALIZATION AND SENTINEL LYMPH NODE BX;  Surgeon: [name], MD;  Location: [facility];  Service: General;  Laterality: Right;  . CARDIAC CATHETERIZATION    . CATARACT EXTRACTION W/PHACO Left 01/01/2017   Procedure: CATARACT EXTRACTION PHACO AND INTRAOCULAR LENS PLACEMENT (IOC)left Restor lens;  Surgeon: Leandrew Koyanagi, MD;  Location: [place];  Service: Ophthalmology;  Laterality: Left;  Restor lens  . CATARACT EXTRACTION W/PHACO Right 01/31/2017   Procedure: CATARACT EXTRACTION PHACO AND INTRAOCULAR LENS PLACEMENT (IOC);  Surgeon: Leandrew Koyanagi, MD;  Location: [place];  Service: Ophthalmology;  Laterality: Right;  . CESAREAN SECTION    . CHOLECYSTECTOMY    . HYSTEROSCOPY  06/2010   D&C  . PORTACATH PLACEMENT Right 09/11/2018   Procedure: INSERTION PORT-A-CATH;  Surgeon: [name], MD;  Location: [facility];  Service: General;  Laterality: Right;  . TUBAL LIGATION      FAMILY HISTORY: Family History  Problem Relation Age of Onset  . Hypertension Mother   . Cancer Mother 80       LUNG  . Hypertension Father   . Breast  cancer Neg Hx     ADVANCED DIRECTIVES (Y/N):  N  HEALTH MAINTENANCE: Social History   Tobacco Use  . Smoking status: Never Smoker  . Smokeless tobacco: Never Used  Substance Use Topics  . Alcohol use: Yes    Alcohol/week: 3.0 standard drinks    Types: 3 Shots of liquor per week    Comment: social drinker  . Drug use: No      Colonoscopy:  PAP:  Bone density:  Lipid panel:  Allergies  Allergen Reactions  . Isosorbide Nitrate Other (See Comments)    headache    Current Outpatient Medications  Medication Sig Dispense Refill  . amLODipine-benazepril (LOTREL) 10-20 MG per capsule Take 1 capsule by mouth daily.     . Cholecalciferol (VITAMIN D3) 1.25 MG (50000 UT) CAPS     . diclofenac (VOLTAREN) 75 MG EC tablet Take 1 tablet (75 mg total) by mouth 2 (two) times daily. 60 tablet 0  . ezetimibe (ZETIA) 10 MG tablet     . fluticasone (FLONASE) 50 MCG/ACT nasal spray Place 2 sprays into both nostrils daily as needed for allergies.     . hydrochlorothiazide (MICROZIDE) 12.5 MG capsule Take 12.5 mg by mouth daily as needed (vertigo).     Marland Kitchen lidocaine-prilocaine (EMLA) cream Apply to affected area once 30 g 3  . LORazepam (ATIVAN) 0.5 MG tablet     . LORazepam (ATIVAN) 1 MG tablet Take 1 mg by mouth at bedtime as needed for anxiety or sleep.    . meclizine (ANTIVERT) 25 MG tablet Take 25 mg by mouth 3 (three) times daily as needed (Vertigo).     . metoprolol tartrate (LOPRESSOR) 100 MG tablet     . nitroGLYCERIN (NITROSTAT) 0.4 MG SL tablet Place 0.4 mg under the tongue every 5 (five) minutes as needed for chest pain.     . NON FORMULARY Inhale 1 application into the lungs at bedtime. CPAP    . omeprazole (PRILOSEC) 20 MG capsule Take 20 mg by mouth daily.     Marland Kitchen omeprazole (PRILOSEC) 40 MG capsule     . ondansetron (ZOFRAN) 8 MG tablet Take 1 tablet (8 mg total) by mouth 2 (two) times daily as needed (Nausea or vomiting). 30 tablet 2  . oxyCODONE-acetaminophen (PERCOCET/ROXICET) 5-325 MG tablet Take 1 tablet by mouth every 4 (four) hours as needed for severe pain. 30 tablet 0  . pravastatin (PRAVACHOL) 10 MG tablet     . prochlorperazine (COMPAZINE) 10 MG tablet Take 1 tablet (10 mg total) by mouth every 6 (six) hours as needed (Nausea or vomiting). 60 tablet 2  . venlafaxine XR (EFFEXOR-XR) 75 MG 24 hr capsule  Take 75 mg by mouth daily with breakfast.      Current Facility-Administered Medications  Medication Dose Route Frequency Provider Last Rate Last Dose  . betamethasone acetate-betamethasone sodium phosphate (CELESTONE) injection 3 mg  3 mg Intramuscular Once Edrick Kins, DPM        OBJECTIVE: Vitals:   10/31/18 0924  BP: (!) 142/86  Pulse: 80  Resp: 18  Temp: 98.2 F (36.8 C)     Body mass index is 36.28 kg/m.    ECOG FS:0 - Asymptomatic  General: Well-developed, well-nourished, no acute distress. Eyes: Pink conjunctiva, anicteric sclera. HEENT: Normocephalic, moist mucous membranes. Breast: Exam deferred today. Lungs: Clear to auscultation bilaterally. Heart: Regular rate and rhythm. No rubs, murmurs, or gallops. Abdomen: Soft, nontender, nondistended. No organomegaly noted, normoactive bowel sounds. Musculoskeletal: No edema, cyanosis,  or clubbing. Neuro: Alert, answering all questions appropriately. Cranial nerves grossly intact. Skin: No rashes or petechiae noted. Psych: Normal affect.  LAB RESULTS:  Lab Results  Component Value Date   NA 137 10/31/2018   K 3.5 10/31/2018   CL 108 10/31/2018   CO2 23 10/31/2018   GLUCOSE 121 (H) 10/31/2018   BUN 13 10/31/2018   CREATININE 0.77 10/31/2018   CALCIUM 8.7 (L) 10/31/2018   PROT 7.2 10/31/2018   ALBUMIN 3.6 10/31/2018   AST 23 10/31/2018   ALT 27 10/31/2018   ALKPHOS 89 10/31/2018   BILITOT 0.4 10/31/2018   GFRNONAA >60 10/31/2018   GFRAA >60 10/31/2018    Lab Results  Component Value Date   WBC 5.2 10/31/2018   NEUTROABS 2.8 10/31/2018   HGB 12.4 10/31/2018   HCT 37.0 10/31/2018   MCV 92.7 10/31/2018   PLT 405 (H) 10/31/2018     STUDIES: No results found.  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 Clinical stage IA ER/PR positive, HER-2 positive invasive carcinoma of the upper inner quadrant of the  right breast: Patient had a lumpectomy on September 11, 2018 confirming stage of disease.  She also had port placement at the same time. Given the fact the patient has HER-2 positive disease, she will require adjuvant chemotherapy with 12 weekly cycles of Taxol and Herceptin followed by maintenance Herceptin every 3 weeks for 12 months.  At the conclusion of her Taxol, patient will benefit from adjuvant XRT.  Patient will also benefit from an aromatase inhibitor for 5 years.  MUGA scan on September 11, 2018 revealed an EF of 62%.  Proceed with cycle 4 of 12 of weekly Herceptin and Taxol today.  Return to clinic in 1 week for lab and treatment only and then in 2 weeks for further evaluation and consideration of cycle 6. 2.  Thrombocytosis: Likely reactive, monitor.    I spent a total of 30 minutes face-to-face with the patient of which greater than 50% of the visit was spent in counseling and coordination of care as detailed above.    Patient expressed understanding and was in agreement with this plan. She also understands that She can call clinic at any time with any questions, concerns, or complaints.   Cancer Staging Malignant neoplasm of upper-inner quadrant of right breast in female, estrogen receptor positive ([place]) Staging form: Breast, AJCC 8th Edition - Clinical stage from 09/06/2018: Stage IA (cT1c, cN0, cM0, G3, ER+, PR+, HER2+) - Signed by Lloyd Huger, MD on 09/06/2018   Lloyd Huger, MD   11/01/2018 9:04 AM

## 2018-10-31 ENCOUNTER — Inpatient Hospital Stay: Payer: BLUE CROSS/BLUE SHIELD | Attending: Oncology

## 2018-10-31 ENCOUNTER — Other Ambulatory Visit: Payer: Self-pay

## 2018-10-31 ENCOUNTER — Inpatient Hospital Stay (HOSPITAL_BASED_OUTPATIENT_CLINIC_OR_DEPARTMENT_OTHER): Payer: BLUE CROSS/BLUE SHIELD | Admitting: Oncology

## 2018-10-31 ENCOUNTER — Encounter: Payer: Self-pay | Admitting: Oncology

## 2018-10-31 ENCOUNTER — Inpatient Hospital Stay: Payer: BLUE CROSS/BLUE SHIELD

## 2018-10-31 VITALS — BP 142/86 | HR 80 | Temp 98.2°F | Resp 18 | Wt 218.0 lb

## 2018-10-31 DIAGNOSIS — C50211 Malignant neoplasm of upper-inner quadrant of right female breast: Secondary | ICD-10-CM | POA: Insufficient documentation

## 2018-10-31 DIAGNOSIS — Z170 Estrogen receptor positive status [ER+]: Principal | ICD-10-CM

## 2018-10-31 DIAGNOSIS — D473 Essential (hemorrhagic) thrombocythemia: Secondary | ICD-10-CM | POA: Diagnosis not present

## 2018-10-31 DIAGNOSIS — Z79899 Other long term (current) drug therapy: Secondary | ICD-10-CM | POA: Insufficient documentation

## 2018-10-31 DIAGNOSIS — Z5111 Encounter for antineoplastic chemotherapy: Secondary | ICD-10-CM | POA: Insufficient documentation

## 2018-10-31 LAB — CBC WITH DIFFERENTIAL/PLATELET
Abs Immature Granulocytes: 0.03 10*3/uL (ref 0.00–0.07)
BASOS PCT: 2 %
Basophils Absolute: 0.1 10*3/uL (ref 0.0–0.1)
EOS PCT: 1 %
Eosinophils Absolute: 0.1 10*3/uL (ref 0.0–0.5)
HCT: 37 % (ref 36.0–46.0)
Hemoglobin: 12.4 g/dL (ref 12.0–15.0)
Immature Granulocytes: 1 %
Lymphocytes Relative: 31 %
Lymphs Abs: 1.6 10*3/uL (ref 0.7–4.0)
MCH: 31.1 pg (ref 26.0–34.0)
MCHC: 33.5 g/dL (ref 30.0–36.0)
MCV: 92.7 fL (ref 80.0–100.0)
Monocytes Absolute: 0.6 10*3/uL (ref 0.1–1.0)
Monocytes Relative: 11 %
Neutro Abs: 2.8 10*3/uL (ref 1.7–7.7)
Neutrophils Relative %: 54 %
Platelets: 405 10*3/uL — ABNORMAL HIGH (ref 150–400)
RBC: 3.99 MIL/uL (ref 3.87–5.11)
RDW: 13 % (ref 11.5–15.5)
WBC: 5.2 10*3/uL (ref 4.0–10.5)
nRBC: 0 % (ref 0.0–0.2)

## 2018-10-31 LAB — COMPREHENSIVE METABOLIC PANEL
ALK PHOS: 89 U/L (ref 38–126)
ALT: 27 U/L (ref 0–44)
AST: 23 U/L (ref 15–41)
Albumin: 3.6 g/dL (ref 3.5–5.0)
Anion gap: 6 (ref 5–15)
BUN: 13 mg/dL (ref 6–20)
CALCIUM: 8.7 mg/dL — AB (ref 8.9–10.3)
CO2: 23 mmol/L (ref 22–32)
Chloride: 108 mmol/L (ref 98–111)
Creatinine, Ser: 0.77 mg/dL (ref 0.44–1.00)
GFR calc Af Amer: >60 mL/min (ref >60)
GFR calc non Af Amer: >60 mL/min (ref >60)
Glucose, Bld: 121 mg/dL — ABNORMAL HIGH (ref 70–99)
Potassium: 3.5 mmol/L (ref 3.5–5.1)
SODIUM: 137 mmol/L (ref 135–145)
Total Bilirubin: 0.4 mg/dL (ref 0.3–1.2)
Total Protein: 7.2 g/dL (ref 6.5–8.1)

## 2018-10-31 MED ORDER — TRASTUZUMAB CHEMO 150 MG IV SOLR
2.0000 mg/kg | Freq: Once | INTRAVENOUS | Status: AC
  Administered 2018-10-31: 189 mg via INTRAVENOUS
  Filled 2018-10-31: qty 9

## 2018-10-31 MED ORDER — SODIUM CHLORIDE 0.9% FLUSH
10.0000 mL | INTRAVENOUS | Status: DC | PRN
  Administered 2018-10-31: 10 mL via INTRAVENOUS
  Filled 2018-10-31: qty 10

## 2018-10-31 MED ORDER — SODIUM CHLORIDE 0.9 % IV SOLN
Freq: Once | INTRAVENOUS | Status: AC
  Administered 2018-10-31: 10:00:00 via INTRAVENOUS
  Filled 2018-10-31: qty 250

## 2018-10-31 MED ORDER — SODIUM CHLORIDE 0.9 % IV SOLN
80.0000 mg/m2 | Freq: Once | INTRAVENOUS | Status: AC
  Administered 2018-10-31: 168 mg via INTRAVENOUS
  Filled 2018-10-31: qty 28

## 2018-10-31 MED ORDER — ACETAMINOPHEN 325 MG PO TABS
650.0000 mg | ORAL_TABLET | Freq: Once | ORAL | Status: AC
  Administered 2018-10-31: 650 mg via ORAL
  Filled 2018-10-31: qty 2

## 2018-10-31 MED ORDER — DIPHENHYDRAMINE HCL 50 MG/ML IJ SOLN
25.0000 mg | Freq: Once | INTRAMUSCULAR | Status: AC
  Administered 2018-10-31: 25 mg via INTRAVENOUS
  Filled 2018-10-31: qty 1

## 2018-10-31 MED ORDER — FAMOTIDINE IN NACL 20-0.9 MG/50ML-% IV SOLN
20.0000 mg | Freq: Once | INTRAVENOUS | Status: AC
  Administered 2018-10-31: 20 mg via INTRAVENOUS
  Filled 2018-10-31: qty 50

## 2018-10-31 MED ORDER — DEXAMETHASONE SODIUM PHOSPHATE 10 MG/ML IJ SOLN
10.0000 mg | Freq: Once | INTRAMUSCULAR | Status: AC
  Administered 2018-10-31: 10 mg via INTRAVENOUS
  Filled 2018-10-31: qty 1

## 2018-10-31 MED ORDER — HEPARIN SOD (PORK) LOCK FLUSH 100 UNIT/ML IV SOLN
500.0000 [IU] | Freq: Once | INTRAVENOUS | Status: AC
  Administered 2018-10-31: 500 [IU] via INTRAVENOUS
  Filled 2018-10-31: qty 5

## 2018-10-31 NOTE — Progress Notes (Signed)
Patient

## 2018-11-07 ENCOUNTER — Other Ambulatory Visit: Payer: Self-pay

## 2018-11-07 ENCOUNTER — Inpatient Hospital Stay: Payer: BLUE CROSS/BLUE SHIELD

## 2018-11-07 VITALS — BP 130/84 | HR 80 | Resp 18 | Wt 219.1 lb

## 2018-11-07 DIAGNOSIS — Z170 Estrogen receptor positive status [ER+]: Principal | ICD-10-CM

## 2018-11-07 DIAGNOSIS — C50211 Malignant neoplasm of upper-inner quadrant of right female breast: Secondary | ICD-10-CM | POA: Diagnosis not present

## 2018-11-07 LAB — CBC WITH DIFFERENTIAL/PLATELET
Abs Immature Granulocytes: 0.07 10*3/uL (ref 0.00–0.07)
Basophils Absolute: 0.1 10*3/uL (ref 0.0–0.1)
Basophils Relative: 2 %
Eosinophils Absolute: 0 10*3/uL (ref 0.0–0.5)
Eosinophils Relative: 1 %
HCT: 37.5 % (ref 36.0–46.0)
Hemoglobin: 12.6 g/dL (ref 12.0–15.0)
Immature Granulocytes: 1 %
Lymphocytes Relative: 29 %
Lymphs Abs: 1.8 10*3/uL (ref 0.7–4.0)
MCH: 31 pg (ref 26.0–34.0)
MCHC: 33.6 g/dL (ref 30.0–36.0)
MCV: 92.1 fL (ref 80.0–100.0)
Monocytes Absolute: 0.6 10*3/uL (ref 0.1–1.0)
Monocytes Relative: 10 %
NRBC: 0 % (ref 0.0–0.2)
Neutro Abs: 3.5 10*3/uL (ref 1.7–7.7)
Neutrophils Relative %: 57 %
Platelets: 381 10*3/uL (ref 150–400)
RBC: 4.07 MIL/uL (ref 3.87–5.11)
RDW: 13.1 % (ref 11.5–15.5)
WBC: 6 10*3/uL (ref 4.0–10.5)

## 2018-11-07 LAB — COMPREHENSIVE METABOLIC PANEL
ALT: 26 U/L (ref 0–44)
AST: 22 U/L (ref 15–41)
Albumin: 3.8 g/dL (ref 3.5–5.0)
Alkaline Phosphatase: 96 U/L (ref 38–126)
Anion gap: 7 (ref 5–15)
BUN: 20 mg/dL (ref 6–20)
CO2: 23 mmol/L (ref 22–32)
Calcium: 9 mg/dL (ref 8.9–10.3)
Chloride: 106 mmol/L (ref 98–111)
Creatinine, Ser: 0.75 mg/dL (ref 0.44–1.00)
GFR calc non Af Amer: >60 mL/min (ref >60)
Glucose, Bld: 104 mg/dL — ABNORMAL HIGH (ref 70–99)
Potassium: 3.9 mmol/L (ref 3.5–5.1)
Sodium: 136 mmol/L (ref 135–145)
Total Bilirubin: 0.3 mg/dL (ref 0.3–1.2)
Total Protein: 7.4 g/dL (ref 6.5–8.1)

## 2018-11-07 MED ORDER — DEXAMETHASONE SODIUM PHOSPHATE 10 MG/ML IJ SOLN
10.0000 mg | Freq: Once | INTRAMUSCULAR | Status: AC
  Administered 2018-11-07: 10 mg via INTRAVENOUS
  Filled 2018-11-07: qty 1

## 2018-11-07 MED ORDER — HEPARIN SOD (PORK) LOCK FLUSH 100 UNIT/ML IV SOLN
500.0000 [IU] | Freq: Once | INTRAVENOUS | Status: DC
  Filled 2018-11-07: qty 5

## 2018-11-07 MED ORDER — SODIUM CHLORIDE 0.9 % IV SOLN
Freq: Once | INTRAVENOUS | Status: AC
  Administered 2018-11-07: 11:00:00 via INTRAVENOUS
  Filled 2018-11-07: qty 250

## 2018-11-07 MED ORDER — SODIUM CHLORIDE 0.9% FLUSH
10.0000 mL | Freq: Once | INTRAVENOUS | Status: AC
  Administered 2018-11-07: 10 mL via INTRAVENOUS
  Filled 2018-11-07: qty 10

## 2018-11-07 MED ORDER — DIPHENHYDRAMINE HCL 50 MG/ML IJ SOLN
25.0000 mg | Freq: Once | INTRAMUSCULAR | Status: AC
  Administered 2018-11-07: 25 mg via INTRAVENOUS
  Filled 2018-11-07: qty 1

## 2018-11-07 MED ORDER — ACETAMINOPHEN 325 MG PO TABS
650.0000 mg | ORAL_TABLET | Freq: Once | ORAL | Status: AC
  Administered 2018-11-07: 650 mg via ORAL
  Filled 2018-11-07: qty 2

## 2018-11-07 MED ORDER — FAMOTIDINE IN NACL 20-0.9 MG/50ML-% IV SOLN
20.0000 mg | Freq: Once | INTRAVENOUS | Status: AC
  Administered 2018-11-07: 20 mg via INTRAVENOUS
  Filled 2018-11-07: qty 50

## 2018-11-07 MED ORDER — TRASTUZUMAB CHEMO 150 MG IV SOLR
2.0000 mg/kg | Freq: Once | INTRAVENOUS | Status: AC
  Administered 2018-11-07: 189 mg via INTRAVENOUS
  Filled 2018-11-07: qty 9

## 2018-11-07 MED ORDER — SODIUM CHLORIDE 0.9 % IV SOLN
80.0000 mg/m2 | Freq: Once | INTRAVENOUS | Status: AC
  Administered 2018-11-07: 168 mg via INTRAVENOUS
  Filled 2018-11-07: qty 28

## 2018-11-07 MED ORDER — HEPARIN SOD (PORK) LOCK FLUSH 100 UNIT/ML IV SOLN
500.0000 [IU] | Freq: Once | INTRAVENOUS | Status: AC | PRN
  Administered 2018-11-07: 500 [IU]

## 2018-11-13 NOTE — Progress Notes (Signed)
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  INTERVAL HISTORY: Patient returns to clinic today for further evaluation and consideration of cycle 6 of 12 of weekly Taxol plus Herceptin.  She continues to tolerate her treatments well without significant side effects.  She currently feels well and is asymptomatic. She has no neurologic complaints.  She denies any recent fevers or illnesses.  She has a good appetite and denies weight loss.  She has no chest pain or shortness of breath.  She denies any nausea, vomiting, constipation, or diarrhea.  She has no urinary complaints.  Patient offers no specific complaints today.  REVIEW OF SYSTEMS:   Review of Systems  Constitutional: Negative.  Negative for fever, malaise/fatigue and weight loss.  Respiratory: Negative.  Negative for cough, hemoptysis and shortness of breath.   Cardiovascular: Negative.  Negative for chest pain and leg swelling.  Gastrointestinal: Negative.  Negative for abdominal pain, blood in stool and melena.  Genitourinary: Negative.  Negative for dysuria.  Musculoskeletal: Negative.  Negative for back pain.  Skin: Negative.  Negative for rash.  Neurological: Negative.  Negative for seizures, weakness and headaches.  Psychiatric/Behavioral: Negative.  The patient is not nervous/anxious.     As per HPI. Otherwise, a complete review of systems is negative.  PAST MEDICAL HISTORY: Past Medical History:  Diagnosis Date  . Anginal pain ([place])   . Arthritis   . Carpal tunnel syndrome of right wrist   . Coronary atherosclerosis of unspecified type of vessel,  native or graft   . Hand and foot pain   . Hyperlipidemia   . Hypertension   . MVP (mitral valve prolapse)   . Sleep apnea   . Vertigo     PAST SURGICAL HISTORY: Past Surgical History:  Procedure Laterality Date  . BREAST BIOPSY Right 08/27/2018   [place] bx, IMC  . BREAST LUMPECTOMY Right 09/11/2018   path pending  . BREAST LUMPECTOMY WITH NEEDLE LOCALIZATION AND AXILLARY SENTINEL LYMPH NODE BX Right 09/11/2018   Procedure: BREAST LUMPECTOMY WITH NEEDLE LOCALIZATION AND SENTINEL LYMPH NODE BX;  Surgeon: [name], MD;  Location: [facility];  Service: General;  Laterality: Right;  . CARDIAC CATHETERIZATION    . CATARACT EXTRACTION W/PHACO Left 01/01/2017   Procedure: CATARACT EXTRACTION PHACO AND INTRAOCULAR LENS PLACEMENT (IOC)left Restor lens;  Surgeon: Leandrew Koyanagi, MD;  Location: [place];  Service: Ophthalmology;  Laterality: Left;  Restor lens  . CATARACT EXTRACTION W/PHACO Right 01/31/2017   Procedure: CATARACT EXTRACTION PHACO AND INTRAOCULAR LENS PLACEMENT (IOC);  Surgeon: Leandrew Koyanagi, MD;  Location: [place];  Service: Ophthalmology;  Laterality: Right;  . CESAREAN SECTION    . CHOLECYSTECTOMY    . HYSTEROSCOPY  06/2010   D&C  . PORTACATH PLACEMENT Right 09/11/2018   Procedure: INSERTION PORT-A-CATH;  Surgeon: [name], MD;  Location: [facility];  Service: General;  Laterality: Right;  . TUBAL LIGATION      FAMILY HISTORY: Family History  Problem Relation Age of Onset  . Hypertension Mother   . Cancer Mother 26       LUNG  . Hypertension Father   . Breast cancer Neg Hx  ADVANCED DIRECTIVES (Y/N):  N  HEALTH MAINTENANCE: Social History   Tobacco Use  . Smoking status: Never Smoker  . Smokeless tobacco: Never Used  Substance Use Topics  . Alcohol use: Yes    Alcohol/week: 3.0 standard drinks    Types: 3 Shots of liquor per week    Comment: social drinker  . Drug use: No     Colonoscopy:  PAP:  Bone  density:  Lipid panel:  Allergies  Allergen Reactions  . Isosorbide Nitrate Other (See Comments)    headache    Current Outpatient Medications  Medication Sig Dispense Refill  . amLODipine-benazepril (LOTREL) 10-20 MG per capsule Take 1 capsule by mouth daily.     . Cholecalciferol (VITAMIN D3) 1.25 MG (50000 UT) CAPS     . diclofenac (VOLTAREN) 75 MG EC tablet Take 1 tablet (75 mg total) by mouth 2 (two) times daily. 60 tablet 0  . ezetimibe (ZETIA) 10 MG tablet     . fluticasone (FLONASE) 50 MCG/ACT nasal spray Place 2 sprays into both nostrils daily as needed for allergies.     . hydrochlorothiazide (MICROZIDE) 12.5 MG capsule Take 12.5 mg by mouth daily as needed (vertigo).     Marland Kitchen lidocaine-prilocaine (EMLA) cream Apply to affected area once 30 g 3  . LORazepam (ATIVAN) 0.5 MG tablet     . LORazepam (ATIVAN) 1 MG tablet Take 1 mg by mouth at bedtime as needed for anxiety or sleep.    . meclizine (ANTIVERT) 25 MG tablet Take 25 mg by mouth 3 (three) times daily as needed (Vertigo).     . metoprolol tartrate (LOPRESSOR) 100 MG tablet     . nitroGLYCERIN (NITROSTAT) 0.4 MG SL tablet Place 0.4 mg under the tongue every 5 (five) minutes as needed for chest pain.     . NON FORMULARY Inhale 1 application into the lungs at bedtime. CPAP    . omeprazole (PRILOSEC) 20 MG capsule Take 20 mg by mouth daily.     Marland Kitchen omeprazole (PRILOSEC) 40 MG capsule     . ondansetron (ZOFRAN) 8 MG tablet Take 1 tablet (8 mg total) by mouth 2 (two) times daily as needed (Nausea or vomiting). 30 tablet 2  . oxyCODONE-acetaminophen (PERCOCET/ROXICET) 5-325 MG tablet Take 1 tablet by mouth every 4 (four) hours as needed for severe pain. 30 tablet 0  . pravastatin (PRAVACHOL) 10 MG tablet     . prochlorperazine (COMPAZINE) 10 MG tablet Take 1 tablet (10 mg total) by mouth every 6 (six) hours as needed (Nausea or vomiting). 60 tablet 2  . venlafaxine XR (EFFEXOR-XR) 75 MG 24 hr capsule Take 75 mg by mouth daily with  breakfast.      Current Facility-Administered Medications  Medication Dose Route Frequency Provider Last Rate Last Dose  . betamethasone acetate-betamethasone sodium phosphate (CELESTONE) injection 3 mg  3 mg Intramuscular Once Edrick Kins, DPM       Facility-Administered Medications Ordered in Other Visits  Medication Dose Route Frequency Provider Last Rate Last Dose  . famotidine (PEPCID) IVPB 20 mg premix  20 mg Intravenous Once Lloyd Huger, MD 100 mL/hr at 11/14/18 1011 20 mg at 11/14/18 1011  . heparin lock flush 100 unit/mL  500 Units Intracatheter Once PRN Lloyd Huger, MD      . PACLitaxel (TAXOL) 168 mg in sodium chloride 0.9 % 250 mL chemo infusion (</= '80mg'$ /m2)  80 mg/m2 (Treatment Plan Recorded) Intravenous Once Lloyd Huger, MD      .  trastuzumab (HERCEPTIN) 189 mg in sodium chloride 0.9 % 250 mL chemo infusion  2 mg/kg (Treatment Plan Recorded) Intravenous Once Lloyd Huger, MD 518 mL/hr at 11/14/18 1034 189 mg at 11/14/18 1034    OBJECTIVE: Vitals:   11/14/18 0908  BP: 133/90  Pulse: 87  Resp: 18  Temp: 98.2 F (36.8 C)     Body mass index is 36.41 kg/m.    ECOG FS:0 - Asymptomatic  General: Well-developed, well-nourished, no acute distress. Eyes: Pink conjunctiva, anicteric sclera. HEENT: Normocephalic, moist mucous membranes. Breast: Exam deferred today. Lungs: Clear to auscultation bilaterally. Heart: Regular rate and rhythm. No rubs, murmurs, or gallops. Abdomen: Soft, nontender, nondistended. No organomegaly noted, normoactive bowel sounds. Musculoskeletal: No edema, cyanosis, or clubbing. Neuro: Alert, answering all questions appropriately. Cranial nerves grossly intact. Skin: No rashes or petechiae noted. Psych: Normal affect.  LAB RESULTS:  Lab Results  Component Value Date   NA 134 (L) 11/14/2018   K 3.9 11/14/2018   CL 105 11/14/2018   CO2 23 11/14/2018   GLUCOSE 114 (H) 11/14/2018   BUN 19 11/14/2018   CREATININE  0.69 11/14/2018   CALCIUM 9.1 11/14/2018   PROT 7.2 11/14/2018   ALBUMIN 3.5 11/14/2018   AST 21 11/14/2018   ALT 26 11/14/2018   ALKPHOS 92 11/14/2018   BILITOT 0.4 11/14/2018   GFRNONAA >60 11/14/2018   GFRAA >60 11/14/2018    Lab Results  Component Value Date   WBC 6.2 11/14/2018   NEUTROABS 3.7 11/14/2018   HGB 12.5 11/14/2018   HCT 37.6 11/14/2018   MCV 92.2 11/14/2018   PLT 413 (H) 11/14/2018     STUDIES: No results found.  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  She also had port placement at the same time. Given the fact the patient has HER-2 positive disease, she will require adjuvant chemotherapy with 12 weekly cycles of Taxol and Herceptin followed by maintenance Herceptin every 3 weeks for 12 months.  At the conclusion of her Taxol, patient will benefit from adjuvant XRT.  Patient will also benefit from an aromatase inhibitor for 5 years.  MUGA scan on September 11, 2018 revealed an EF of 62%.  Repeat in April 2020.  Proceed with cycle 6 of 12 of weekly Herceptin and Taxol today.  Return to clinic in 1 week for lab and treatment only and then in 2 weeks for further evaluation and consideration of cycle 8.   2.  Thrombocytosis: Chronic and unchanged.  Likely reactive, monitor.    I spent a total of 30 minutes face-to-face with the patient of which greater than 50% of the visit was spent in counseling and coordination of care as detailed above.   Patient expressed understanding and was in agreement with this plan. She also understands that She can call clinic at any time with any questions, concerns, or complaints.   Cancer Staging Malignant neoplasm of upper-inner quadrant of right  breast in female, estrogen receptor positive ([place]) Staging form: Breast, AJCC 8th Edition - Clinical stage from 09/06/2018: Stage IA (cT1c, cN0, cM0, G3, ER+, PR+, HER2+) - Signed by Lloyd Huger, MD on 09/06/2018   Lloyd Huger, MD   11/14/2018 10:39 AM

## 2018-11-14 ENCOUNTER — Inpatient Hospital Stay: Payer: BLUE CROSS/BLUE SHIELD

## 2018-11-14 ENCOUNTER — Encounter: Payer: Self-pay | Admitting: Oncology

## 2018-11-14 ENCOUNTER — Other Ambulatory Visit: Payer: Self-pay

## 2018-11-14 ENCOUNTER — Inpatient Hospital Stay (HOSPITAL_BASED_OUTPATIENT_CLINIC_OR_DEPARTMENT_OTHER): Payer: BLUE CROSS/BLUE SHIELD | Admitting: Oncology

## 2018-11-14 VITALS — BP 133/90 | HR 87 | Temp 98.2°F | Resp 18 | Wt 218.8 lb

## 2018-11-14 DIAGNOSIS — Z170 Estrogen receptor positive status [ER+]: Secondary | ICD-10-CM | POA: Diagnosis not present

## 2018-11-14 DIAGNOSIS — C50211 Malignant neoplasm of upper-inner quadrant of right female breast: Secondary | ICD-10-CM

## 2018-11-14 DIAGNOSIS — D473 Essential (hemorrhagic) thrombocythemia: Secondary | ICD-10-CM | POA: Diagnosis not present

## 2018-11-14 DIAGNOSIS — Z79899 Other long term (current) drug therapy: Secondary | ICD-10-CM | POA: Diagnosis not present

## 2018-11-14 LAB — CBC WITH DIFFERENTIAL/PLATELET
Abs Immature Granulocytes: 0.07 10*3/uL (ref 0.00–0.07)
Basophils Absolute: 0.1 10*3/uL (ref 0.0–0.1)
Basophils Relative: 2 %
EOS ABS: 0.1 10*3/uL (ref 0.0–0.5)
Eosinophils Relative: 1 %
HEMATOCRIT: 37.6 % (ref 36.0–46.0)
Hemoglobin: 12.5 g/dL (ref 12.0–15.0)
Immature Granulocytes: 1 %
LYMPHS ABS: 1.6 10*3/uL (ref 0.7–4.0)
Lymphocytes Relative: 26 %
MCH: 30.6 pg (ref 26.0–34.0)
MCHC: 33.2 g/dL (ref 30.0–36.0)
MCV: 92.2 fL (ref 80.0–100.0)
Monocytes Absolute: 0.7 10*3/uL (ref 0.1–1.0)
Monocytes Relative: 11 %
Neutro Abs: 3.7 10*3/uL (ref 1.7–7.7)
Neutrophils Relative %: 59 %
PLATELETS: 413 10*3/uL — AB (ref 150–400)
RBC: 4.08 MIL/uL (ref 3.87–5.11)
RDW: 13.4 % (ref 11.5–15.5)
WBC: 6.2 10*3/uL (ref 4.0–10.5)
nRBC: 0 % (ref 0.0–0.2)

## 2018-11-14 LAB — COMPREHENSIVE METABOLIC PANEL
ALT: 26 U/L (ref 0–44)
ANION GAP: 6 (ref 5–15)
AST: 21 U/L (ref 15–41)
Albumin: 3.5 g/dL (ref 3.5–5.0)
Alkaline Phosphatase: 92 U/L (ref 38–126)
BUN: 19 mg/dL (ref 6–20)
CO2: 23 mmol/L (ref 22–32)
Calcium: 9.1 mg/dL (ref 8.9–10.3)
Chloride: 105 mmol/L (ref 98–111)
Creatinine, Ser: 0.69 mg/dL (ref 0.44–1.00)
GFR calc Af Amer: >60 mL/min (ref >60)
GFR calc non Af Amer: >60 mL/min (ref >60)
Glucose, Bld: 114 mg/dL — ABNORMAL HIGH (ref 70–99)
Potassium: 3.9 mmol/L (ref 3.5–5.1)
SODIUM: 134 mmol/L — AB (ref 135–145)
Total Bilirubin: 0.4 mg/dL (ref 0.3–1.2)
Total Protein: 7.2 g/dL (ref 6.5–8.1)

## 2018-11-14 MED ORDER — DIPHENHYDRAMINE HCL 50 MG/ML IJ SOLN
25.0000 mg | Freq: Once | INTRAMUSCULAR | Status: AC
  Administered 2018-11-14: 25 mg via INTRAVENOUS
  Filled 2018-11-14: qty 1

## 2018-11-14 MED ORDER — SODIUM CHLORIDE 0.9 % IV SOLN
80.0000 mg/m2 | Freq: Once | INTRAVENOUS | Status: AC
  Administered 2018-11-14: 168 mg via INTRAVENOUS
  Filled 2018-11-14: qty 28

## 2018-11-14 MED ORDER — FAMOTIDINE IN NACL 20-0.9 MG/50ML-% IV SOLN
20.0000 mg | Freq: Once | INTRAVENOUS | Status: AC
  Administered 2018-11-14: 20 mg via INTRAVENOUS
  Filled 2018-11-14: qty 50

## 2018-11-14 MED ORDER — TRASTUZUMAB CHEMO 150 MG IV SOLR
2.0000 mg/kg | Freq: Once | INTRAVENOUS | Status: AC
  Administered 2018-11-14: 189 mg via INTRAVENOUS
  Filled 2018-11-14: qty 9

## 2018-11-14 MED ORDER — ACETAMINOPHEN 325 MG PO TABS
650.0000 mg | ORAL_TABLET | Freq: Once | ORAL | Status: AC
  Administered 2018-11-14: 650 mg via ORAL
  Filled 2018-11-14: qty 2

## 2018-11-14 MED ORDER — HEPARIN SOD (PORK) LOCK FLUSH 100 UNIT/ML IV SOLN
500.0000 [IU] | Freq: Once | INTRAVENOUS | Status: AC | PRN
  Administered 2018-11-14: 500 [IU]
  Filled 2018-11-14: qty 5

## 2018-11-14 MED ORDER — DEXAMETHASONE SODIUM PHOSPHATE 10 MG/ML IJ SOLN
10.0000 mg | Freq: Once | INTRAMUSCULAR | Status: AC
  Administered 2018-11-14: 10 mg via INTRAVENOUS
  Filled 2018-11-14: qty 1

## 2018-11-14 MED ORDER — SODIUM CHLORIDE 0.9 % IV SOLN
Freq: Once | INTRAVENOUS | Status: AC
  Administered 2018-11-14: 10:00:00 via INTRAVENOUS
  Filled 2018-11-14: qty 250

## 2018-11-14 NOTE — Progress Notes (Signed)
Patient denies any concerns today.  

## 2018-11-20 ENCOUNTER — Other Ambulatory Visit: Payer: Self-pay

## 2018-11-21 ENCOUNTER — Inpatient Hospital Stay: Payer: BLUE CROSS/BLUE SHIELD

## 2018-11-21 ENCOUNTER — Other Ambulatory Visit: Payer: Self-pay

## 2018-11-21 VITALS — BP 134/80 | HR 90 | Temp 97.0°F | Resp 17 | Wt 218.6 lb

## 2018-11-21 DIAGNOSIS — Z170 Estrogen receptor positive status [ER+]: Principal | ICD-10-CM

## 2018-11-21 DIAGNOSIS — C50211 Malignant neoplasm of upper-inner quadrant of right female breast: Secondary | ICD-10-CM | POA: Diagnosis not present

## 2018-11-21 LAB — CBC WITH DIFFERENTIAL/PLATELET
Abs Immature Granulocytes: 0.05 10*3/uL (ref 0.00–0.07)
Basophils Absolute: 0.1 10*3/uL (ref 0.0–0.1)
Basophils Relative: 2 %
EOS ABS: 0.1 10*3/uL (ref 0.0–0.5)
Eosinophils Relative: 1 %
HCT: 37.4 % (ref 36.0–46.0)
Hemoglobin: 12.4 g/dL (ref 12.0–15.0)
Immature Granulocytes: 1 %
Lymphocytes Relative: 24 %
Lymphs Abs: 1.4 10*3/uL (ref 0.7–4.0)
MCH: 30.6 pg (ref 26.0–34.0)
MCHC: 33.2 g/dL (ref 30.0–36.0)
MCV: 92.3 fL (ref 80.0–100.0)
Monocytes Absolute: 0.7 10*3/uL (ref 0.1–1.0)
Monocytes Relative: 12 %
Neutro Abs: 3.5 10*3/uL (ref 1.7–7.7)
Neutrophils Relative %: 60 %
Platelets: 402 10*3/uL — ABNORMAL HIGH (ref 150–400)
RBC: 4.05 MIL/uL (ref 3.87–5.11)
RDW: 13.6 % (ref 11.5–15.5)
WBC: 5.8 10*3/uL (ref 4.0–10.5)
nRBC: 0 % (ref 0.0–0.2)

## 2018-11-21 LAB — COMPREHENSIVE METABOLIC PANEL
ALT: 23 U/L (ref 0–44)
AST: 26 U/L (ref 15–41)
Albumin: 3.5 g/dL (ref 3.5–5.0)
Alkaline Phosphatase: 83 U/L (ref 38–126)
Anion gap: 8 (ref 5–15)
BUN: 13 mg/dL (ref 6–20)
CALCIUM: 8.8 mg/dL — AB (ref 8.9–10.3)
CO2: 22 mmol/L (ref 22–32)
Chloride: 107 mmol/L (ref 98–111)
Creatinine, Ser: 0.84 mg/dL (ref 0.44–1.00)
GFR calc Af Amer: >60 mL/min (ref >60)
GFR calc non Af Amer: >60 mL/min (ref >60)
Glucose, Bld: 125 mg/dL — ABNORMAL HIGH (ref 70–99)
Potassium: 3.6 mmol/L (ref 3.5–5.1)
SODIUM: 137 mmol/L (ref 135–145)
Total Bilirubin: 0.3 mg/dL (ref 0.3–1.2)
Total Protein: 6.9 g/dL (ref 6.5–8.1)

## 2018-11-21 MED ORDER — SODIUM CHLORIDE 0.9 % IV SOLN
Freq: Once | INTRAVENOUS | Status: AC
  Administered 2018-11-21: 09:00:00 via INTRAVENOUS
  Filled 2018-11-21: qty 250

## 2018-11-21 MED ORDER — SODIUM CHLORIDE 0.9% FLUSH
10.0000 mL | INTRAVENOUS | Status: DC | PRN
  Administered 2018-11-21: 10 mL via INTRAVENOUS
  Filled 2018-11-21: qty 10

## 2018-11-21 MED ORDER — HEPARIN SOD (PORK) LOCK FLUSH 100 UNIT/ML IV SOLN
500.0000 [IU] | Freq: Once | INTRAVENOUS | Status: AC
  Administered 2018-11-21: 500 [IU] via INTRAVENOUS
  Filled 2018-11-21: qty 5

## 2018-11-21 MED ORDER — TRASTUZUMAB CHEMO 150 MG IV SOLR
2.0000 mg/kg | Freq: Once | INTRAVENOUS | Status: AC
  Administered 2018-11-21: 189 mg via INTRAVENOUS
  Filled 2018-11-21: qty 9

## 2018-11-21 MED ORDER — ACETAMINOPHEN 325 MG PO TABS
650.0000 mg | ORAL_TABLET | Freq: Once | ORAL | Status: AC
  Administered 2018-11-21: 650 mg via ORAL
  Filled 2018-11-21: qty 2

## 2018-11-21 MED ORDER — SODIUM CHLORIDE 0.9 % IV SOLN
Freq: Once | INTRAVENOUS | Status: AC
  Administered 2018-11-21: 10:00:00 via INTRAVENOUS
  Filled 2018-11-21: qty 100

## 2018-11-21 MED ORDER — DEXAMETHASONE SODIUM PHOSPHATE 10 MG/ML IJ SOLN
10.0000 mg | Freq: Once | INTRAMUSCULAR | Status: AC
  Administered 2018-11-21: 10 mg via INTRAVENOUS
  Filled 2018-11-21: qty 1

## 2018-11-21 MED ORDER — SODIUM CHLORIDE 0.9 % IV SOLN
80.0000 mg/m2 | Freq: Once | INTRAVENOUS | Status: AC
  Administered 2018-11-21: 168 mg via INTRAVENOUS
  Filled 2018-11-21: qty 28

## 2018-11-21 MED ORDER — DIPHENHYDRAMINE HCL 50 MG/ML IJ SOLN
25.0000 mg | Freq: Once | INTRAMUSCULAR | Status: AC
  Administered 2018-11-21: 25 mg via INTRAVENOUS
  Filled 2018-11-21: qty 1

## 2018-11-28 ENCOUNTER — Encounter: Payer: Self-pay | Admitting: Oncology

## 2018-11-28 ENCOUNTER — Inpatient Hospital Stay (HOSPITAL_BASED_OUTPATIENT_CLINIC_OR_DEPARTMENT_OTHER): Payer: BLUE CROSS/BLUE SHIELD | Admitting: Oncology

## 2018-11-28 ENCOUNTER — Other Ambulatory Visit: Payer: Self-pay

## 2018-11-28 ENCOUNTER — Inpatient Hospital Stay: Payer: BLUE CROSS/BLUE SHIELD

## 2018-11-28 ENCOUNTER — Inpatient Hospital Stay: Payer: BLUE CROSS/BLUE SHIELD | Attending: Oncology

## 2018-11-28 VITALS — BP 145/95 | HR 90 | Temp 97.3°F | Wt 220.0 lb

## 2018-11-28 DIAGNOSIS — Z79899 Other long term (current) drug therapy: Secondary | ICD-10-CM

## 2018-11-28 DIAGNOSIS — Z170 Estrogen receptor positive status [ER+]: Secondary | ICD-10-CM | POA: Diagnosis not present

## 2018-11-28 DIAGNOSIS — D473 Essential (hemorrhagic) thrombocythemia: Secondary | ICD-10-CM | POA: Insufficient documentation

## 2018-11-28 DIAGNOSIS — C50211 Malignant neoplasm of upper-inner quadrant of right female breast: Secondary | ICD-10-CM | POA: Insufficient documentation

## 2018-11-28 DIAGNOSIS — D649 Anemia, unspecified: Secondary | ICD-10-CM | POA: Insufficient documentation

## 2018-11-28 DIAGNOSIS — Z5111 Encounter for antineoplastic chemotherapy: Secondary | ICD-10-CM | POA: Insufficient documentation

## 2018-11-28 LAB — COMPREHENSIVE METABOLIC PANEL
ALT: 27 U/L (ref 0–44)
AST: 25 U/L (ref 15–41)
Albumin: 3.5 g/dL (ref 3.5–5.0)
Alkaline Phosphatase: 89 U/L (ref 38–126)
Anion gap: 7 (ref 5–15)
BUN: 15 mg/dL (ref 6–20)
CO2: 23 mmol/L (ref 22–32)
Calcium: 9.2 mg/dL (ref 8.9–10.3)
Chloride: 105 mmol/L (ref 98–111)
Creatinine, Ser: 0.76 mg/dL (ref 0.44–1.00)
GFR calc Af Amer: >60 mL/min (ref >60)
GFR calc non Af Amer: >60 mL/min (ref >60)
Glucose, Bld: 128 mg/dL — ABNORMAL HIGH (ref 70–99)
Potassium: 3.8 mmol/L (ref 3.5–5.1)
Sodium: 135 mmol/L (ref 135–145)
Total Bilirubin: 0.3 mg/dL (ref 0.3–1.2)
Total Protein: 7.1 g/dL (ref 6.5–8.1)

## 2018-11-28 LAB — CBC WITH DIFFERENTIAL/PLATELET
Abs Immature Granulocytes: 0.1 10*3/uL — ABNORMAL HIGH (ref 0.00–0.07)
Basophils Absolute: 0.1 10*3/uL (ref 0.0–0.1)
Basophils Relative: 2 %
Eosinophils Absolute: 0.1 10*3/uL (ref 0.0–0.5)
Eosinophils Relative: 2 %
HCT: 36.3 % (ref 36.0–46.0)
Hemoglobin: 12.1 g/dL (ref 12.0–15.0)
Immature Granulocytes: 2 %
Lymphocytes Relative: 26 %
Lymphs Abs: 1.5 10*3/uL (ref 0.7–4.0)
MCH: 30.6 pg (ref 26.0–34.0)
MCHC: 33.3 g/dL (ref 30.0–36.0)
MCV: 91.9 fL (ref 80.0–100.0)
Monocytes Absolute: 0.7 10*3/uL (ref 0.1–1.0)
Monocytes Relative: 11 %
Neutro Abs: 3.5 10*3/uL (ref 1.7–7.7)
Neutrophils Relative %: 57 %
Platelets: 399 10*3/uL (ref 150–400)
RBC: 3.95 MIL/uL (ref 3.87–5.11)
RDW: 13.9 % (ref 11.5–15.5)
WBC: 6 10*3/uL (ref 4.0–10.5)
nRBC: 0 % (ref 0.0–0.2)

## 2018-11-28 MED ORDER — SODIUM CHLORIDE 0.9% FLUSH
10.0000 mL | Freq: Once | INTRAVENOUS | Status: AC
  Administered 2018-11-28: 10 mL via INTRAVENOUS
  Filled 2018-11-28: qty 10

## 2018-11-28 MED ORDER — TRASTUZUMAB CHEMO 150 MG IV SOLR
2.0000 mg/kg | Freq: Once | INTRAVENOUS | Status: AC
  Administered 2018-11-28: 189 mg via INTRAVENOUS
  Filled 2018-11-28: qty 9

## 2018-11-28 MED ORDER — HEPARIN SOD (PORK) LOCK FLUSH 100 UNIT/ML IV SOLN
500.0000 [IU] | Freq: Once | INTRAVENOUS | Status: AC
  Administered 2018-11-28: 12:00:00 500 [IU] via INTRAVENOUS

## 2018-11-28 MED ORDER — SODIUM CHLORIDE 0.9 % IV SOLN
Freq: Once | INTRAVENOUS | Status: AC
  Administered 2018-11-28: 10:00:00 via INTRAVENOUS
  Filled 2018-11-28: qty 250

## 2018-11-28 MED ORDER — SODIUM CHLORIDE 0.9 % IV SOLN
80.0000 mg/m2 | Freq: Once | INTRAVENOUS | Status: AC
  Administered 2018-11-28: 168 mg via INTRAVENOUS
  Filled 2018-11-28: qty 28

## 2018-11-28 MED ORDER — DIPHENHYDRAMINE HCL 50 MG/ML IJ SOLN
25.0000 mg | Freq: Once | INTRAMUSCULAR | Status: AC
  Administered 2018-11-28: 25 mg via INTRAVENOUS
  Filled 2018-11-28: qty 1

## 2018-11-28 MED ORDER — ACETAMINOPHEN 325 MG PO TABS
650.0000 mg | ORAL_TABLET | Freq: Once | ORAL | Status: AC
  Administered 2018-11-28: 650 mg via ORAL
  Filled 2018-11-28: qty 2

## 2018-11-28 MED ORDER — FAMOTIDINE IN NACL 20-0.9 MG/50ML-% IV SOLN
20.0000 mg | Freq: Once | INTRAVENOUS | Status: AC
  Administered 2018-11-28: 10:00:00 20 mg via INTRAVENOUS
  Filled 2018-11-28: qty 50

## 2018-11-28 MED ORDER — DEXAMETHASONE SODIUM PHOSPHATE 10 MG/ML IJ SOLN
10.0000 mg | Freq: Once | INTRAMUSCULAR | Status: AC
  Administered 2018-11-28: 10 mg via INTRAVENOUS
  Filled 2018-11-28: qty 1

## 2018-11-28 NOTE — Progress Notes (Signed)
Patient stated that she had been doing well with no complaints. 

## 2018-11-28 NOTE — Progress Notes (Signed)
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  INTERVAL HISTORY: Patient returns to clinic today for further evaluation and consideration of cycle 8 of 12 of weekly Taxol plus Herceptin.  She currently feels well and is asymptomatic.  She is tolerating her treatments well without significant side effects.  She has no neurologic complaints.  She denies any recent fevers or illnesses.  She has a good appetite and denies weight loss.  She denies any chest pain, shortness of breath, cough, or hemoptysis.  She denies any nausea, vomiting, constipation, or diarrhea.  She has no urinary complaints.  Patient feels at her baseline offers no specific complaints today.  REVIEW OF SYSTEMS:   Review of Systems  Constitutional: Negative.  Negative for fever, malaise/fatigue and weight loss.  Respiratory: Negative.  Negative for cough, hemoptysis and shortness of breath.   Cardiovascular: Negative.  Negative for chest pain and leg swelling.  Gastrointestinal: Negative.  Negative for abdominal pain, blood in stool and melena.  Genitourinary: Negative.  Negative for dysuria.  Musculoskeletal: Negative.  Negative for back pain.  Skin: Negative.  Negative for rash.  Neurological: Negative.  Negative for seizures, weakness and headaches.  Psychiatric/Behavioral: Negative.  The patient is not nervous/anxious.     As per HPI. Otherwise, a complete review of systems is negative.  PAST MEDICAL HISTORY: Past Medical History:  Diagnosis Date  . Anginal pain ([place])   . Arthritis   . Carpal tunnel syndrome of right wrist   . Coronary  atherosclerosis of unspecified type of vessel, native or graft   . Hand and foot pain   . Hyperlipidemia   . Hypertension   . MVP (mitral valve prolapse)   . Sleep apnea   . Vertigo     PAST SURGICAL HISTORY: Past Surgical History:  Procedure Laterality Date  . BREAST BIOPSY Right 08/27/2018   [place] bx, IMC  . BREAST LUMPECTOMY Right 09/11/2018   path pending  . BREAST LUMPECTOMY WITH NEEDLE LOCALIZATION AND AXILLARY SENTINEL LYMPH NODE BX Right 09/11/2018   Procedure: BREAST LUMPECTOMY WITH NEEDLE LOCALIZATION AND SENTINEL LYMPH NODE BX;  Surgeon: [name], MD;  Location: [facility];  Service: General;  Laterality: Right;  . CARDIAC CATHETERIZATION    . CATARACT EXTRACTION W/PHACO Left 01/01/2017   Procedure: CATARACT EXTRACTION PHACO AND INTRAOCULAR LENS PLACEMENT (IOC)left Restor lens;  Surgeon: Leandrew Koyanagi, MD;  Location: [place];  Service: Ophthalmology;  Laterality: Left;  Restor lens  . CATARACT EXTRACTION W/PHACO Right 01/31/2017   Procedure: CATARACT EXTRACTION PHACO AND INTRAOCULAR LENS PLACEMENT (IOC);  Surgeon: Leandrew Koyanagi, MD;  Location: [place];  Service: Ophthalmology;  Laterality: Right;  . CESAREAN SECTION    . CHOLECYSTECTOMY    . HYSTEROSCOPY  06/2010   D&C  . PORTACATH PLACEMENT Right 09/11/2018   Procedure: INSERTION PORT-A-CATH;  Surgeon: [name], MD;  Location: [facility];  Service: General;  Laterality: Right;  . TUBAL LIGATION      FAMILY HISTORY: Family History  Problem Relation Age of Onset  . Hypertension Mother   . Cancer Mother 84       LUNG  . Hypertension Father   .  Breast cancer Neg Hx     ADVANCED DIRECTIVES (Y/N):  N  HEALTH MAINTENANCE: Social History   Tobacco Use  . Smoking status: Never Smoker  . Smokeless tobacco: Never Used  Substance Use Topics  . Alcohol use: Yes    Alcohol/week: 3.0 standard drinks    Types: 3 Shots of liquor per week    Comment: social drinker  . Drug  use: No     Colonoscopy:  PAP:  Bone density:  Lipid panel:  Allergies  Allergen Reactions  . Isosorbide Nitrate Other (See Comments)    headache    Current Outpatient Medications  Medication Sig Dispense Refill  . amLODipine-benazepril (LOTREL) 10-20 MG per capsule Take 1 capsule by mouth daily.     . Cholecalciferol (VITAMIN D3) 1.25 MG (50000 UT) CAPS     . diclofenac (VOLTAREN) 75 MG EC tablet Take 1 tablet (75 mg total) by mouth 2 (two) times daily. 60 tablet 0  . ezetimibe (ZETIA) 10 MG tablet     . fluticasone (FLONASE) 50 MCG/ACT nasal spray Place 2 sprays into both nostrils daily as needed for allergies.     . hydrochlorothiazide (MICROZIDE) 12.5 MG capsule Take 12.5 mg by mouth daily as needed (vertigo).     Marland Kitchen lidocaine-prilocaine (EMLA) cream Apply to affected area once 30 g 3  . LORazepam (ATIVAN) 0.5 MG tablet     . LORazepam (ATIVAN) 1 MG tablet Take 1 mg by mouth at bedtime as needed for anxiety or sleep.    . meclizine (ANTIVERT) 25 MG tablet Take 25 mg by mouth 3 (three) times daily as needed (Vertigo).     . metoprolol tartrate (LOPRESSOR) 100 MG tablet     . nitroGLYCERIN (NITROSTAT) 0.4 MG SL tablet Place 0.4 mg under the tongue every 5 (five) minutes as needed for chest pain.     . NON FORMULARY Inhale 1 application into the lungs at bedtime. CPAP    . omeprazole (PRILOSEC) 20 MG capsule Take 20 mg by mouth daily.     Marland Kitchen omeprazole (PRILOSEC) 40 MG capsule     . ondansetron (ZOFRAN) 8 MG tablet Take 1 tablet (8 mg total) by mouth 2 (two) times daily as needed (Nausea or vomiting). 30 tablet 2  . oxyCODONE-acetaminophen (PERCOCET/ROXICET) 5-325 MG tablet Take 1 tablet by mouth every 4 (four) hours as needed for severe pain. 30 tablet 0  . pravastatin (PRAVACHOL) 10 MG tablet     . prochlorperazine (COMPAZINE) 10 MG tablet Take 1 tablet (10 mg total) by mouth every 6 (six) hours as needed (Nausea or vomiting). 60 tablet 2  . venlafaxine XR (EFFEXOR-XR) 75 MG 24 hr  capsule Take 75 mg by mouth daily with breakfast.      Current Facility-Administered Medications  Medication Dose Route Frequency Provider Last Rate Last Dose  . betamethasone acetate-betamethasone sodium phosphate (CELESTONE) injection 3 mg  3 mg Intramuscular Once Daylene Katayama M, DPM        OBJECTIVE: Vitals:   11/28/18 0829  BP: (!) 145/95  Pulse: 90  Temp: (!) 97.3 F (36.3 C)     Body mass index is 36.61 kg/m.    ECOG FS:0 - Asymptomatic  General: Well-developed, well-nourished, no acute distress. Eyes: Pink conjunctiva, anicteric sclera. HEENT: Normocephalic, moist mucous membranes, clear oropharnyx. Breast: Exam deferred today. Lungs: Clear to auscultation bilaterally. Heart: Regular rate and rhythm. No rubs, murmurs, or gallops. Abdomen: Soft, nontender, nondistended. No organomegaly noted, normoactive bowel sounds. Musculoskeletal: No edema,  cyanosis, or clubbing. Neuro: Alert, answering all questions appropriately. Cranial nerves grossly intact. Skin: No rashes or petechiae noted. Psych: Normal affect.  LAB RESULTS:  Lab Results  Component Value Date   NA 135 11/28/2018   K 3.8 11/28/2018   CL 105 11/28/2018   CO2 23 11/28/2018   GLUCOSE 128 (H) 11/28/2018   BUN 15 11/28/2018   CREATININE 0.76 11/28/2018   CALCIUM 9.2 11/28/2018   PROT 7.1 11/28/2018   ALBUMIN 3.5 11/28/2018   AST 25 11/28/2018   ALT 27 11/28/2018   ALKPHOS 89 11/28/2018   BILITOT 0.3 11/28/2018   GFRNONAA >60 11/28/2018   GFRAA >60 11/28/2018    Lab Results  Component Value Date   WBC 6.0 11/28/2018   NEUTROABS 3.5 11/28/2018   HGB 12.1 11/28/2018   HCT 36.3 11/28/2018   MCV 91.9 11/28/2018   PLT 399 11/28/2018     STUDIES: No results found.  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 Clinical stage IA ER/PR positive, HER-2 positive invasive carcinoma of the upper inner  quadrant of the right breast: Patient had a lumpectomy on September 11, 2018 confirming stage of disease.  She also had port placement at the same time. Given the fact the patient has HER-2 positive disease, she will require adjuvant chemotherapy with 12 weekly cycles of Taxol and Herceptin followed by maintenance Herceptin every 3 weeks for 12 months.  At the conclusion of her Taxol, patient will benefit from adjuvant XRT.  Patient will also benefit from an aromatase inhibitor for 5 years.  MUGA scan on September 11, 2018 revealed an EF of 62%.  Repeat MUGA in the next 1 to 2 weeks.  Proceed with cycle 8 of 12 of weekly Herceptin and Taxol today.  Return to clinic in 1 week for laboratory work and treatment only and then in 2 weeks for further evaluation and consideration of cycle 10.  Will give referral to radiation oncology at her next clinic visit.   2.  Thrombocytosis: Resolved.  I spent a total of 30 minutes face-to-face with the patient of which greater than 50% of the visit was spent in counseling and coordination of care as detailed above.   Patient expressed understanding and was in agreement with this plan. She also understands that She can call clinic at any time with any questions, concerns, or complaints.   Cancer Staging Malignant neoplasm of upper-inner quadrant of right breast in female, estrogen receptor positive ([place]) Staging form: Breast, AJCC 8th Edition - Clinical stage from 09/06/2018: Stage IA (cT1c, cN0, cM0, G3, ER+, PR+, HER2+) - Signed by Lloyd Huger, MD on 09/06/2018   Lloyd Huger, MD   11/29/2018 12:24 PM

## 2018-12-02 ENCOUNTER — Encounter: Payer: Self-pay | Admitting: *Deleted

## 2018-12-02 NOTE — Progress Notes (Signed)
Called patient to follow up with her chemotherapy treatment.   States she is doing well and tolerating treatments well.  No needs at this time.

## 2018-12-04 ENCOUNTER — Other Ambulatory Visit: Payer: Self-pay | Admitting: *Deleted

## 2018-12-04 ENCOUNTER — Other Ambulatory Visit: Payer: Self-pay

## 2018-12-04 DIAGNOSIS — R059 Cough, unspecified: Secondary | ICD-10-CM

## 2018-12-04 DIAGNOSIS — R05 Cough: Secondary | ICD-10-CM

## 2018-12-05 ENCOUNTER — Inpatient Hospital Stay: Payer: BLUE CROSS/BLUE SHIELD

## 2018-12-05 ENCOUNTER — Other Ambulatory Visit: Payer: Self-pay

## 2018-12-05 VITALS — BP 127/82 | HR 91 | Temp 97.3°F | Resp 18 | Wt 220.0 lb

## 2018-12-05 DIAGNOSIS — Z170 Estrogen receptor positive status [ER+]: Principal | ICD-10-CM

## 2018-12-05 DIAGNOSIS — C50211 Malignant neoplasm of upper-inner quadrant of right female breast: Secondary | ICD-10-CM | POA: Diagnosis not present

## 2018-12-05 LAB — COMPREHENSIVE METABOLIC PANEL
ALT: 28 U/L (ref 0–44)
AST: 26 U/L (ref 15–41)
Albumin: 3.4 g/dL — ABNORMAL LOW (ref 3.5–5.0)
Alkaline Phosphatase: 83 U/L (ref 38–126)
Anion gap: 8 (ref 5–15)
BUN: 15 mg/dL (ref 6–20)
CO2: 22 mmol/L (ref 22–32)
Calcium: 9.1 mg/dL (ref 8.9–10.3)
Chloride: 106 mmol/L (ref 98–111)
Creatinine, Ser: 0.69 mg/dL (ref 0.44–1.00)
GFR calc Af Amer: >60 mL/min (ref >60)
GFR calc non Af Amer: >60 mL/min (ref >60)
Glucose, Bld: 118 mg/dL — ABNORMAL HIGH (ref 70–99)
Potassium: 3.5 mmol/L (ref 3.5–5.1)
Sodium: 136 mmol/L (ref 135–145)
Total Bilirubin: 0.4 mg/dL (ref 0.3–1.2)
Total Protein: 6.9 g/dL (ref 6.5–8.1)

## 2018-12-05 LAB — CBC WITH DIFFERENTIAL/PLATELET
Abs Immature Granulocytes: 0.06 10*3/uL (ref 0.00–0.07)
Basophils Absolute: 0.1 10*3/uL (ref 0.0–0.1)
Basophils Relative: 2 %
Eosinophils Absolute: 0.1 10*3/uL (ref 0.0–0.5)
Eosinophils Relative: 2 %
HCT: 35.3 % — ABNORMAL LOW (ref 36.0–46.0)
Hemoglobin: 11.9 g/dL — ABNORMAL LOW (ref 12.0–15.0)
Immature Granulocytes: 1 %
Lymphocytes Relative: 26 %
Lymphs Abs: 1.2 10*3/uL (ref 0.7–4.0)
MCH: 30.9 pg (ref 26.0–34.0)
MCHC: 33.7 g/dL (ref 30.0–36.0)
MCV: 91.7 fL (ref 80.0–100.0)
Monocytes Absolute: 0.6 10*3/uL (ref 0.1–1.0)
Monocytes Relative: 13 %
Neutro Abs: 2.6 10*3/uL (ref 1.7–7.7)
Neutrophils Relative %: 56 %
Platelets: 398 10*3/uL (ref 150–400)
RBC: 3.85 MIL/uL — ABNORMAL LOW (ref 3.87–5.11)
RDW: 14 % (ref 11.5–15.5)
WBC: 4.7 10*3/uL (ref 4.0–10.5)
nRBC: 0 % (ref 0.0–0.2)

## 2018-12-05 MED ORDER — SODIUM CHLORIDE 0.9 % IV SOLN
Freq: Once | INTRAVENOUS | Status: AC
  Administered 2018-12-05: 10:00:00 via INTRAVENOUS
  Filled 2018-12-05: qty 250

## 2018-12-05 MED ORDER — SODIUM CHLORIDE 0.9% FLUSH
10.0000 mL | Freq: Once | INTRAVENOUS | Status: AC
  Administered 2018-12-05: 09:00:00 10 mL via INTRAVENOUS
  Filled 2018-12-05: qty 10

## 2018-12-05 MED ORDER — DIPHENHYDRAMINE HCL 50 MG/ML IJ SOLN
25.0000 mg | Freq: Once | INTRAMUSCULAR | Status: AC
  Administered 2018-12-05: 10:00:00 25 mg via INTRAVENOUS
  Filled 2018-12-05: qty 1

## 2018-12-05 MED ORDER — DEXAMETHASONE SODIUM PHOSPHATE 10 MG/ML IJ SOLN
10.0000 mg | Freq: Once | INTRAMUSCULAR | Status: AC
  Administered 2018-12-05: 10:00:00 10 mg via INTRAVENOUS
  Filled 2018-12-05: qty 1

## 2018-12-05 MED ORDER — FAMOTIDINE IN NACL 20-0.9 MG/50ML-% IV SOLN
20.0000 mg | Freq: Once | INTRAVENOUS | Status: AC
  Administered 2018-12-05: 20 mg via INTRAVENOUS
  Filled 2018-12-05: qty 50

## 2018-12-05 MED ORDER — SODIUM CHLORIDE 0.9 % IV SOLN
80.0000 mg/m2 | Freq: Once | INTRAVENOUS | Status: AC
  Administered 2018-12-05: 168 mg via INTRAVENOUS
  Filled 2018-12-05: qty 28

## 2018-12-05 MED ORDER — ACETAMINOPHEN 325 MG PO TABS
650.0000 mg | ORAL_TABLET | Freq: Once | ORAL | Status: AC
  Administered 2018-12-05: 10:00:00 650 mg via ORAL
  Filled 2018-12-05: qty 2

## 2018-12-05 MED ORDER — HEPARIN SOD (PORK) LOCK FLUSH 100 UNIT/ML IV SOLN
500.0000 [IU] | Freq: Once | INTRAVENOUS | Status: AC
  Administered 2018-12-05: 500 [IU] via INTRAVENOUS
  Filled 2018-12-05: qty 5

## 2018-12-05 MED ORDER — TRASTUZUMAB CHEMO 150 MG IV SOLR
2.0000 mg/kg | Freq: Once | INTRAVENOUS | Status: AC
  Administered 2018-12-05: 10:00:00 189 mg via INTRAVENOUS
  Filled 2018-12-05: qty 9

## 2018-12-06 ENCOUNTER — Ambulatory Visit
Admission: RE | Admit: 2018-12-06 | Discharge: 2018-12-06 | Disposition: A | Discharge status: Home / Self Care | Payer: BLUE CROSS/BLUE SHIELD | Source: Ambulatory Visit | Attending: Oncology | Admitting: Oncology

## 2018-12-06 DIAGNOSIS — Z170 Estrogen receptor positive status [ER+]: Secondary | ICD-10-CM | POA: Diagnosis present

## 2018-12-06 DIAGNOSIS — C50211 Malignant neoplasm of upper-inner quadrant of right female breast: Secondary | ICD-10-CM

## 2018-12-06 MED ORDER — TECHNETIUM TC 99M-LABELED RED BLOOD CELLS IV KIT
23.2400 | PACK | Freq: Once | INTRAVENOUS | Status: AC | PRN
  Administered 2018-12-06: 13:00:00 23.24 via INTRAVENOUS

## 2018-12-12 ENCOUNTER — Inpatient Hospital Stay (HOSPITAL_BASED_OUTPATIENT_CLINIC_OR_DEPARTMENT_OTHER): Payer: BLUE CROSS/BLUE SHIELD | Admitting: Oncology

## 2018-12-12 ENCOUNTER — Encounter: Payer: Self-pay | Admitting: Oncology

## 2018-12-12 ENCOUNTER — Inpatient Hospital Stay: Payer: BLUE CROSS/BLUE SHIELD

## 2018-12-12 ENCOUNTER — Other Ambulatory Visit: Payer: Self-pay

## 2018-12-12 VITALS — BP 138/89 | HR 98 | Temp 98.3°F | Wt 221.4 lb

## 2018-12-12 DIAGNOSIS — D649 Anemia, unspecified: Secondary | ICD-10-CM

## 2018-12-12 DIAGNOSIS — C50211 Malignant neoplasm of upper-inner quadrant of right female breast: Secondary | ICD-10-CM

## 2018-12-12 DIAGNOSIS — Z170 Estrogen receptor positive status [ER+]: Principal | ICD-10-CM

## 2018-12-12 DIAGNOSIS — D473 Essential (hemorrhagic) thrombocythemia: Secondary | ICD-10-CM | POA: Diagnosis not present

## 2018-12-12 DIAGNOSIS — Z95828 Presence of other vascular implants and grafts: Secondary | ICD-10-CM

## 2018-12-12 DIAGNOSIS — Z79899 Other long term (current) drug therapy: Secondary | ICD-10-CM

## 2018-12-12 LAB — COMPREHENSIVE METABOLIC PANEL
ALT: 25 U/L (ref 0–44)
AST: 24 U/L (ref 15–41)
Albumin: 3.4 g/dL — ABNORMAL LOW (ref 3.5–5.0)
Alkaline Phosphatase: 90 U/L (ref 38–126)
Anion gap: 6 (ref 5–15)
BUN: 13 mg/dL (ref 6–20)
CO2: 23 mmol/L (ref 22–32)
Calcium: 8.8 mg/dL — ABNORMAL LOW (ref 8.9–10.3)
Chloride: 107 mmol/L (ref 98–111)
Creatinine, Ser: 0.92 mg/dL (ref 0.44–1.00)
GFR calc Af Amer: >60 mL/min (ref >60)
GFR calc non Af Amer: >60 mL/min (ref >60)
Glucose, Bld: 108 mg/dL — ABNORMAL HIGH (ref 70–99)
Potassium: 4 mmol/L (ref 3.5–5.1)
Sodium: 136 mmol/L (ref 135–145)
Total Bilirubin: 0.3 mg/dL (ref 0.3–1.2)
Total Protein: 6.9 g/dL (ref 6.5–8.1)

## 2018-12-12 LAB — CBC WITH DIFFERENTIAL/PLATELET
Abs Immature Granulocytes: 0.09 10*3/uL — ABNORMAL HIGH (ref 0.00–0.07)
Basophils Absolute: 0.1 10*3/uL (ref 0.0–0.1)
Basophils Relative: 2 %
Eosinophils Absolute: 0.1 10*3/uL (ref 0.0–0.5)
Eosinophils Relative: 1 %
HCT: 34.9 % — ABNORMAL LOW (ref 36.0–46.0)
Hemoglobin: 11.6 g/dL — ABNORMAL LOW (ref 12.0–15.0)
Immature Granulocytes: 2 %
Lymphocytes Relative: 28 %
Lymphs Abs: 1.6 10*3/uL (ref 0.7–4.0)
MCH: 31 pg (ref 26.0–34.0)
MCHC: 33.2 g/dL (ref 30.0–36.0)
MCV: 93.3 fL (ref 80.0–100.0)
Monocytes Absolute: 0.7 10*3/uL (ref 0.1–1.0)
Monocytes Relative: 12 %
Neutro Abs: 3.3 10*3/uL (ref 1.7–7.7)
Neutrophils Relative %: 55 %
Platelets: 428 10*3/uL — ABNORMAL HIGH (ref 150–400)
RBC: 3.74 MIL/uL — ABNORMAL LOW (ref 3.87–5.11)
RDW: 14.4 % (ref 11.5–15.5)
WBC: 5.9 10*3/uL (ref 4.0–10.5)
nRBC: 0 % (ref 0.0–0.2)

## 2018-12-12 MED ORDER — HEPARIN SOD (PORK) LOCK FLUSH 100 UNIT/ML IV SOLN
500.0000 [IU] | Freq: Once | INTRAVENOUS | Status: AC | PRN
  Administered 2018-12-12: 12:00:00 500 [IU]
  Filled 2018-12-12: qty 5

## 2018-12-12 MED ORDER — ACETAMINOPHEN 325 MG PO TABS
650.0000 mg | ORAL_TABLET | Freq: Once | ORAL | Status: AC
  Administered 2018-12-12: 650 mg via ORAL
  Filled 2018-12-12: qty 2

## 2018-12-12 MED ORDER — DIPHENHYDRAMINE HCL 50 MG/ML IJ SOLN
25.0000 mg | Freq: Once | INTRAMUSCULAR | Status: AC
  Administered 2018-12-12: 10:00:00 25 mg via INTRAVENOUS
  Filled 2018-12-12: qty 1

## 2018-12-12 MED ORDER — SODIUM CHLORIDE 0.9 % IV SOLN
80.0000 mg/m2 | Freq: Once | INTRAVENOUS | Status: AC
  Administered 2018-12-12: 168 mg via INTRAVENOUS
  Filled 2018-12-12: qty 28

## 2018-12-12 MED ORDER — SODIUM CHLORIDE 0.9 % IV SOLN
Freq: Once | INTRAVENOUS | Status: AC
  Administered 2018-12-12: 10:00:00 via INTRAVENOUS
  Filled 2018-12-12: qty 250

## 2018-12-12 MED ORDER — SODIUM CHLORIDE 0.9% FLUSH
10.0000 mL | Freq: Once | INTRAVENOUS | Status: AC
  Administered 2018-12-12: 10 mL via INTRAVENOUS
  Filled 2018-12-12: qty 10

## 2018-12-12 MED ORDER — TRASTUZUMAB CHEMO 150 MG IV SOLR
2.0000 mg/kg | Freq: Once | INTRAVENOUS | Status: AC
  Administered 2018-12-12: 189 mg via INTRAVENOUS
  Filled 2018-12-12: qty 9

## 2018-12-12 MED ORDER — DEXAMETHASONE SODIUM PHOSPHATE 10 MG/ML IJ SOLN
10.0000 mg | Freq: Once | INTRAMUSCULAR | Status: AC
  Administered 2018-12-12: 10 mg via INTRAVENOUS
  Filled 2018-12-12: qty 1

## 2018-12-12 MED ORDER — FAMOTIDINE IN NACL 20-0.9 MG/50ML-% IV SOLN
20.0000 mg | Freq: Once | INTRAVENOUS | Status: AC
  Administered 2018-12-12: 20 mg via INTRAVENOUS
  Filled 2018-12-12: qty 50

## 2018-12-12 NOTE — Progress Notes (Signed)
Patient stated that she had been doing well. On Saturday she felt fatigued but then she was feeling better.

## 2018-12-12 NOTE — Progress Notes (Signed)
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  INTERVAL HISTORY: Patient returns to clinic today for further evaluation and consideration of cycle 10 of 12 of weekly Taxol plus Herceptin.  She continues to tolerate her treatments well without significant side effects.  She currently feels well and is asymptomatic. She has no neurologic complaints.  She denies any recent fevers or illnesses.  She has a good appetite and denies weight loss.  She denies any chest pain, shortness of breath, cough, or hemoptysis.  She denies any nausea, vomiting, constipation, or diarrhea.  She has no urinary complaints.  Patient offers no specific complaints today.  REVIEW OF SYSTEMS:   Review of Systems  Constitutional: Negative.  Negative for fever, malaise/fatigue and weight loss.  Respiratory: Negative.  Negative for cough, hemoptysis and shortness of breath.   Cardiovascular: Negative.  Negative for chest pain and leg swelling.  Gastrointestinal: Negative.  Negative for abdominal pain, blood in stool and melena.  Genitourinary: Negative.  Negative for dysuria.  Musculoskeletal: Negative.  Negative for back pain.  Skin: Negative.  Negative for rash.  Neurological: Negative.  Negative for seizures, weakness and headaches.  Psychiatric/Behavioral: Negative.  The patient is not nervous/anxious.     As per HPI. Otherwise, a complete review of systems is negative.  PAST MEDICAL HISTORY: Past Medical History:  Diagnosis Date  . Anginal pain ([place])   . Arthritis   . Carpal tunnel syndrome of right wrist   . Coronary atherosclerosis of  unspecified type of vessel, native or graft   . Hand and foot pain   . Hyperlipidemia   . Hypertension   . MVP (mitral valve prolapse)   . Sleep apnea   . Vertigo     PAST SURGICAL HISTORY: Past Surgical History:  Procedure Laterality Date  . BREAST BIOPSY Right 08/27/2018   [place] bx, IMC  . BREAST LUMPECTOMY Right 09/11/2018   path pending  . BREAST LUMPECTOMY WITH NEEDLE LOCALIZATION AND AXILLARY SENTINEL LYMPH NODE BX Right 09/11/2018   Procedure: BREAST LUMPECTOMY WITH NEEDLE LOCALIZATION AND SENTINEL LYMPH NODE BX;  Surgeon: [name], MD;  Location: [facility];  Service: General;  Laterality: Right;  . CARDIAC CATHETERIZATION    . CATARACT EXTRACTION W/PHACO Left 01/01/2017   Procedure: CATARACT EXTRACTION PHACO AND INTRAOCULAR LENS PLACEMENT (IOC)left Restor lens;  Surgeon: Leandrew Koyanagi, MD;  Location: [facility];  Service: Ophthalmology;  Laterality: Left;  Restor lens  . CATARACT EXTRACTION W/PHACO Right 01/31/2017   Procedure: CATARACT EXTRACTION PHACO AND INTRAOCULAR LENS PLACEMENT (IOC);  Surgeon: Leandrew Koyanagi, MD;  Location: [facility];  Service: Ophthalmology;  Laterality: Right;  . CESAREAN SECTION    . CHOLECYSTECTOMY    . HYSTEROSCOPY  06/2010   D&C  . PORTACATH PLACEMENT Right 09/11/2018   Procedure: INSERTION PORT-A-CATH;  Surgeon: [name], MD;  Location: [facility];  Service: General;  Laterality: Right;  . TUBAL LIGATION      FAMILY HISTORY: Family History  Problem Relation Age of Onset  . Hypertension Mother   . Cancer Mother 49       LUNG  . Hypertension Father   . Breast cancer  Neg Hx     ADVANCED DIRECTIVES (Y/N):  N  HEALTH MAINTENANCE: Social History   Tobacco Use  . Smoking status: Never Smoker  . Smokeless tobacco: Never Used  Substance Use Topics  . Alcohol use: Yes    Alcohol/week: 3.0 standard drinks    Types: 3 Shots of liquor per week    Comment: social drinker  . Drug use: No      Colonoscopy:  PAP:  Bone density:  Lipid panel:  Allergies  Allergen Reactions  . Isosorbide Nitrate Other (See Comments)    headache    Current Outpatient Medications  Medication Sig Dispense Refill  . amLODipine-benazepril (LOTREL) 10-20 MG per capsule Take 1 capsule by mouth daily.     . Cholecalciferol (VITAMIN D3) 1.25 MG (50000 UT) CAPS     . diclofenac (VOLTAREN) 75 MG EC tablet Take 1 tablet (75 mg total) by mouth 2 (two) times daily. 60 tablet 0  . ezetimibe (ZETIA) 10 MG tablet     . fluticasone (FLONASE) 50 MCG/ACT nasal spray Place 2 sprays into both nostrils daily as needed for allergies.     . hydrochlorothiazide (MICROZIDE) 12.5 MG capsule Take 12.5 mg by mouth daily as needed (vertigo).     Marland Kitchen lidocaine-prilocaine (EMLA) cream Apply to affected area once 30 g 3  . LORazepam (ATIVAN) 0.5 MG tablet     . LORazepam (ATIVAN) 1 MG tablet Take 1 mg by mouth at bedtime as needed for anxiety or sleep.    . meclizine (ANTIVERT) 25 MG tablet Take 25 mg by mouth 3 (three) times daily as needed (Vertigo).     . metoprolol tartrate (LOPRESSOR) 100 MG tablet     . nitroGLYCERIN (NITROSTAT) 0.4 MG SL tablet Place 0.4 mg under the tongue every 5 (five) minutes as needed for chest pain.     . NON FORMULARY Inhale 1 application into the lungs at bedtime. CPAP    . omeprazole (PRILOSEC) 20 MG capsule Take 20 mg by mouth daily.     Marland Kitchen omeprazole (PRILOSEC) 40 MG capsule     . ondansetron (ZOFRAN) 8 MG tablet Take 1 tablet (8 mg total) by mouth 2 (two) times daily as needed (Nausea or vomiting). 30 tablet 2  . oxyCODONE-acetaminophen (PERCOCET/ROXICET) 5-325 MG tablet Take 1 tablet by mouth every 4 (four) hours as needed for severe pain. 30 tablet 0  . pravastatin (PRAVACHOL) 10 MG tablet     . prochlorperazine (COMPAZINE) 10 MG tablet Take 1 tablet (10 mg total) by mouth every 6 (six) hours as needed (Nausea or vomiting). 60 tablet 2  . venlafaxine XR (EFFEXOR-XR) 75 MG 24 hr capsule Take  75 mg by mouth daily with breakfast.      Current Facility-Administered Medications  Medication Dose Route Frequency Provider Last Rate Last Dose  . betamethasone acetate-betamethasone sodium phosphate (CELESTONE) injection 3 mg  3 mg Intramuscular Once Daylene Katayama M, DPM        OBJECTIVE: Vitals:   12/12/18 0913  BP: 138/89  Pulse: 98  Temp: 98.3 F (36.8 C)     Body mass index is 36.84 kg/m.    ECOG FS:0 - Asymptomatic  General: Well-developed, well-nourished, no acute distress. Eyes: Pink conjunctiva, anicteric sclera. HEENT: Normocephalic, moist mucous membranes. Breast: Exam deferred today. Lungs: Clear to auscultation bilaterally. Heart: Regular rate and rhythm. No rubs, murmurs, or gallops. Abdomen: Soft, nontender, nondistended. No organomegaly noted, normoactive bowel sounds. Musculoskeletal: No edema, cyanosis, or clubbing. Neuro: Alert, answering  all questions appropriately. Cranial nerves grossly intact. Skin: No rashes or petechiae noted. Psych: Normal affect.  LAB RESULTS:  Lab Results  Component Value Date   NA 136 12/12/2018   K 4.0 12/12/2018   CL 107 12/12/2018   CO2 23 12/12/2018   GLUCOSE 108 (H) 12/12/2018   BUN 13 12/12/2018   CREATININE 0.92 12/12/2018   CALCIUM 8.8 (L) 12/12/2018   PROT 6.9 12/12/2018   ALBUMIN 3.4 (L) 12/12/2018   AST 24 12/12/2018   ALT 25 12/12/2018   ALKPHOS 90 12/12/2018   BILITOT 0.3 12/12/2018   GFRNONAA >60 12/12/2018   GFRAA >60 12/12/2018    Lab Results  Component Value Date   WBC 5.9 12/12/2018   NEUTROABS 3.3 12/12/2018   HGB 11.6 (L) 12/12/2018   HCT 34.9 (L) 12/12/2018   MCV 93.3 12/12/2018   PLT 428 (H) 12/12/2018     STUDIES: Nm Cardiac Muga Rest  Result Date: 12/06/2018 CLINICAL DATA:  Breast cancer, chemotherapy cardiac assessment. EXAM: NUCLEAR MEDICINE CARDIAC BLOOD POOL IMAGING (MUGA) TECHNIQUE: Cardiac multi-gated acquisition was performed at rest following intravenous injection of Tc-76m labeled red blood cells. RADIOPHARMACEUTICALS:  23.2 mCi [id number] in-vitro labeled red blood cells IV COMPARISON:  09/24/2018 FINDINGS: Phase and amplitude clots appear within normal limits. Exam quality satisfactory. Over 612 excepted cardiac beats, left ventricular resting ejection fraction was 61%. IMPRESSION: 1. Left ventricular ejection fraction is calculated at 61%, and was previously measured at 62% on 09/24/2018. Electronically Signed   By: WaVan Clines.D.   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  She also had port placement at the same time. Given the fact the patient has HER-2 positive disease, she will require adjuvant chemotherapy with 12 weekly cycles of Taxol and Herceptin followed by maintenance Herceptin every 3 weeks for 12 months.  At the conclusion of her Taxol, patient will benefit from adjuvant XRT and a referral was given for radiation oncology today.  Patient will also benefit from an aromatase inhibitor for 5 years.  Her most recent MUGA scan on December 06, 2018 revealed an EF of 61% which is unchanged from previous.  Proceed with cycle 10 of 12 of weekly Herceptin and Taxol today.  Return to clinic in 1 week for treatment only and then in 2 weeks for further evaluation and consideration of cycle 12.   2.  Thrombocytosis: Mild, monitor. 3.  Anemia: Patient's hemoglobin is 11.6, monitor.  I spent a total of 30 minutes face-to-face with the patient of which greater than 50% of the visit was spent in counseling and coordination of care as detailed above.   Patient expressed understanding and was in agreement with this plan. She also understands that She  can call clinic at any time with any questions, concerns, or complaints.   Cancer Staging Malignant neoplasm of upper-inner quadrant of right breast in female, estrogen receptor positive (HCCypressStaging form: Breast, AJCC 8th Edition - Clinical stage from 09/06/2018: Stage IA (cT1c, cN0, cM0, G3, ER+, PR+, HER2+) - Signed by FiLloyd HugerMD on 09/06/2018   TiLloyd HugerMD   12/12/2018 12:24 PM

## 2018-12-13 ENCOUNTER — Telehealth: Payer: Self-pay | Admitting: *Deleted

## 2018-12-13 NOTE — Telephone Encounter (Signed)
We had received a message from the answering service in regards to this patient from [place] with [employer]. He was wanting to know if patient was able to travel when purchasing travel insurance as of 03-21-18. [place]'s number is [id number]. Claim reference # [id number]. Message states that our office is able to leave a message if agent does not pick up.   I did call and speak with the patient this morning in regards to the above. Patient explains she had planned a cruise for this year. This was planned in 2019. After purchasing, she then needed to have surgery with Dr. Rosana Hoes and treatment at the [facility]. First appointment with Dr. Rosana Hoes was not until 09-05-18. Patient states that she had purchased a cruise and travel insurance before all of this happened and she is just trying to get her money back since she was unable to go. Patient believes travel agency is trying not to give her the money that was paid for the cruise even thought she had insurance because they are trying to say she purchased when she was diagnosed and undergoing care. Patient did give verbal permission that I could call and speak with [facility]. She is hoping that we will be able to resolve this issue. Patient appreciative of the call today.   I did call [place] with [employer] at the number above but have to leave a detailed message.

## 2018-12-18 ENCOUNTER — Other Ambulatory Visit: Payer: Self-pay

## 2018-12-19 ENCOUNTER — Inpatient Hospital Stay: Payer: BLUE CROSS/BLUE SHIELD

## 2018-12-19 ENCOUNTER — Other Ambulatory Visit: Payer: Self-pay

## 2018-12-19 ENCOUNTER — Ambulatory Visit
Admission: RE | Admit: 2018-12-19 | Discharge: 2018-12-19 | Disposition: A | Discharge status: Home / Self Care | Payer: BLUE CROSS/BLUE SHIELD | Source: Ambulatory Visit | Attending: Radiation Oncology | Admitting: Radiation Oncology

## 2018-12-19 VITALS — BP 122/84 | HR 98 | Temp 98.2°F | Resp 19 | Wt 220.0 lb

## 2018-12-19 VITALS — BP 142/93 | HR 100 | Temp 97.0°F | Resp 18 | Wt 219.4 lb

## 2018-12-19 DIAGNOSIS — R42 Dizziness and giddiness: Secondary | ICD-10-CM | POA: Diagnosis not present

## 2018-12-19 DIAGNOSIS — C50211 Malignant neoplasm of upper-inner quadrant of right female breast: Secondary | ICD-10-CM | POA: Insufficient documentation

## 2018-12-19 DIAGNOSIS — I341 Nonrheumatic mitral (valve) prolapse: Secondary | ICD-10-CM | POA: Insufficient documentation

## 2018-12-19 DIAGNOSIS — I2510 Atherosclerotic heart disease of native coronary artery without angina pectoris: Secondary | ICD-10-CM | POA: Diagnosis not present

## 2018-12-19 DIAGNOSIS — Z170 Estrogen receptor positive status [ER+]: Secondary | ICD-10-CM | POA: Diagnosis not present

## 2018-12-19 DIAGNOSIS — M129 Arthropathy, unspecified: Secondary | ICD-10-CM | POA: Insufficient documentation

## 2018-12-19 DIAGNOSIS — Z79899 Other long term (current) drug therapy: Secondary | ICD-10-CM | POA: Diagnosis not present

## 2018-12-19 DIAGNOSIS — E785 Hyperlipidemia, unspecified: Secondary | ICD-10-CM | POA: Diagnosis not present

## 2018-12-19 DIAGNOSIS — I10 Essential (primary) hypertension: Secondary | ICD-10-CM | POA: Diagnosis not present

## 2018-12-19 DIAGNOSIS — G4730 Sleep apnea, unspecified: Secondary | ICD-10-CM | POA: Diagnosis not present

## 2018-12-19 DIAGNOSIS — Z9221 Personal history of antineoplastic chemotherapy: Secondary | ICD-10-CM | POA: Diagnosis not present

## 2018-12-19 LAB — CBC WITH DIFFERENTIAL/PLATELET
Abs Immature Granulocytes: 0.1 10*3/uL — ABNORMAL HIGH (ref 0.00–0.07)
Basophils Absolute: 0.1 10*3/uL (ref 0.0–0.1)
Basophils Relative: 2 %
Eosinophils Absolute: 0.1 10*3/uL (ref 0.0–0.5)
Eosinophils Relative: 1 %
HCT: 35.1 % — ABNORMAL LOW (ref 36.0–46.0)
Hemoglobin: 11.6 g/dL — ABNORMAL LOW (ref 12.0–15.0)
Immature Granulocytes: 2 %
Lymphocytes Relative: 31 %
Lymphs Abs: 1.9 10*3/uL (ref 0.7–4.0)
MCH: 30.6 pg (ref 26.0–34.0)
MCHC: 33 g/dL (ref 30.0–36.0)
MCV: 92.6 fL (ref 80.0–100.0)
Monocytes Absolute: 0.8 10*3/uL (ref 0.1–1.0)
Monocytes Relative: 13 %
Neutro Abs: 3.1 10*3/uL (ref 1.7–7.7)
Neutrophils Relative %: 51 %
Platelets: 429 10*3/uL — ABNORMAL HIGH (ref 150–400)
RBC: 3.79 MIL/uL — ABNORMAL LOW (ref 3.87–5.11)
RDW: 14.7 % (ref 11.5–15.5)
WBC: 5.9 10*3/uL (ref 4.0–10.5)
nRBC: 0 % (ref 0.0–0.2)

## 2018-12-19 LAB — COMPREHENSIVE METABOLIC PANEL
ALT: 31 U/L (ref 0–44)
AST: 28 U/L (ref 15–41)
Albumin: 3.5 g/dL (ref 3.5–5.0)
Alkaline Phosphatase: 83 U/L (ref 38–126)
Anion gap: 8 (ref 5–15)
BUN: 20 mg/dL (ref 6–20)
CO2: 23 mmol/L (ref 22–32)
Calcium: 9.2 mg/dL (ref 8.9–10.3)
Chloride: 107 mmol/L (ref 98–111)
Creatinine, Ser: 0.74 mg/dL (ref 0.44–1.00)
GFR calc Af Amer: >60 mL/min (ref >60)
GFR calc non Af Amer: >60 mL/min (ref >60)
Glucose, Bld: 114 mg/dL — ABNORMAL HIGH (ref 70–99)
Potassium: 3.8 mmol/L (ref 3.5–5.1)
Sodium: 138 mmol/L (ref 135–145)
Total Bilirubin: 0.3 mg/dL (ref 0.3–1.2)
Total Protein: 7.1 g/dL (ref 6.5–8.1)

## 2018-12-19 MED ORDER — ACETAMINOPHEN 325 MG PO TABS
650.0000 mg | ORAL_TABLET | Freq: Once | ORAL | Status: AC
  Administered 2018-12-19: 10:00:00 650 mg via ORAL
  Filled 2018-12-19: qty 2

## 2018-12-19 MED ORDER — SODIUM CHLORIDE 0.9 % IV SOLN
80.0000 mg/m2 | Freq: Once | INTRAVENOUS | Status: AC
  Administered 2018-12-19: 12:00:00 168 mg via INTRAVENOUS
  Filled 2018-12-19: qty 28

## 2018-12-19 MED ORDER — DIPHENHYDRAMINE HCL 50 MG/ML IJ SOLN
25.0000 mg | Freq: Once | INTRAMUSCULAR | Status: AC
  Administered 2018-12-19: 10:00:00 25 mg via INTRAVENOUS
  Filled 2018-12-19: qty 1

## 2018-12-19 MED ORDER — TRASTUZUMAB CHEMO 150 MG IV SOLR
2.0000 mg/kg | Freq: Once | INTRAVENOUS | Status: AC
  Administered 2018-12-19: 189 mg via INTRAVENOUS
  Filled 2018-12-19: qty 9

## 2018-12-19 MED ORDER — HEPARIN SOD (PORK) LOCK FLUSH 100 UNIT/ML IV SOLN
500.0000 [IU] | Freq: Once | INTRAVENOUS | Status: AC | PRN
  Administered 2018-12-19: 13:00:00 500 [IU]
  Filled 2018-12-19: qty 5

## 2018-12-19 MED ORDER — SODIUM CHLORIDE 0.9 % IV SOLN
Freq: Once | INTRAVENOUS | Status: AC
  Administered 2018-12-19: 09:00:00 via INTRAVENOUS
  Filled 2018-12-19: qty 250

## 2018-12-19 MED ORDER — DEXAMETHASONE SODIUM PHOSPHATE 10 MG/ML IJ SOLN
10.0000 mg | Freq: Once | INTRAMUSCULAR | Status: AC
  Administered 2018-12-19: 10 mg via INTRAVENOUS
  Filled 2018-12-19: qty 1

## 2018-12-19 MED ORDER — SODIUM CHLORIDE 0.9% FLUSH
10.0000 mL | Freq: Once | INTRAVENOUS | Status: AC
  Administered 2018-12-19: 09:00:00 10 mL via INTRAVENOUS
  Filled 2018-12-19: qty 10

## 2018-12-19 MED ORDER — FAMOTIDINE IN NACL 20-0.9 MG/50ML-% IV SOLN
20.0000 mg | Freq: Once | INTRAVENOUS | Status: AC
  Administered 2018-12-19: 20 mg via INTRAVENOUS
  Filled 2018-12-19: qty 50

## 2018-12-19 NOTE — Consult Note (Signed)
NEW PATIENT EVALUATION  Name: Erin Church  MRN: [id number]  Date:   12/19/2018     DOB: Dec 21, 1960   This 58 y.o. female patient presents to the clinic for initial evaluation of stage I (T1 cN0 M0) triple positive invasive mammary carcinoma of the right breast status post wide local excision and adjuvant chemotherapy  REFERRING PHYSICIAN: Perrin Maltese, MD  CHIEF COMPLAINT:  Chief Complaint  Patient presents with  . Breast Cancer    Initial Eval    DIAGNOSIS: The encounter diagnosis was Malignant neoplasm of upper-inner quadrant of right breast in female, estrogen receptor positive ([place]).   PREVIOUS INVESTIGATIONS:  Mammogram and ultrasound reviewed Pathology report reviewed Clinical notes reviewed  HPI: Patient is a 58 year old female who presented with a abnormal mammogram of her right breast.  Both mammogram and ultrasound confirmed a 1.1 cm to suspicious mass in the 2 o'clock position of the right breast.  Ultrasound-guided core biopsy was positive for invasive mammary carcinoma.  Tumor was triple positive ER PR and HER-2/neu overexpressed.  She underwent a wide local excision and sentinel lymph node biopsy showing a high-grade overall 3 invasive mammary carcinoma of the right breast 1.3 cm in greatest dimension.  Margins were clear at 8 mm.  4 sentinel lymph nodes were examined all negative for metastatic disease.  Lymphovascular invasion was not identified.  Patient tolerated surgery well.  She was started on adjuvant chemotherapy with Taxol and Herceptin.  She will complete Taxol therapy next week receiving 12 cycles.  She will continue on maintenance Herceptin.  She is seen today for radiation oncology opinion.  She is doing well.  She specifically denies breast tenderness cough or bone pain. PLANNED TREATMENT REGIMEN: Hypofractionated right whole  breast radiation  PAST MEDICAL HISTORY:  has a past medical history of Anginal pain ([place]), Arthritis, Carpal tunnel syndrome of  right wrist, Coronary atherosclerosis of unspecified type of vessel, native or graft, Hand and foot pain, Hyperlipidemia, Hypertension, MVP (mitral valve prolapse), Sleep apnea, and Vertigo.    PAST SURGICAL HISTORY:  Past Surgical History:  Procedure Laterality Date  . BREAST BIOPSY Right 08/27/2018   [place] bx, IMC  . BREAST LUMPECTOMY Right 09/11/2018   path pending  . BREAST LUMPECTOMY WITH NEEDLE LOCALIZATION AND AXILLARY SENTINEL LYMPH NODE BX Right 09/11/2018   Procedure: BREAST LUMPECTOMY WITH NEEDLE LOCALIZATION AND SENTINEL LYMPH NODE BX;  Surgeon: [name], MD;  Location: [facility];  Service: General;  Laterality: Right;  . CARDIAC CATHETERIZATION    . CATARACT EXTRACTION W/PHACO Left 01/01/2017   Procedure: CATARACT EXTRACTION PHACO AND INTRAOCULAR LENS PLACEMENT (IOC)left Restor lens;  Surgeon: Leandrew Koyanagi, MD;  Location: [facility];  Service: Ophthalmology;  Laterality: Left;  Restor lens  . CATARACT EXTRACTION W/PHACO Right 01/31/2017   Procedure: CATARACT EXTRACTION PHACO AND INTRAOCULAR LENS PLACEMENT (IOC);  Surgeon: Leandrew Koyanagi, MD;  Location: [facility];  Service: Ophthalmology;  Laterality: Right;  . CESAREAN SECTION    . CHOLECYSTECTOMY    . HYSTEROSCOPY  06/2010   D&C  . PORTACATH PLACEMENT Right 09/11/2018   Procedure: INSERTION PORT-A-CATH;  Surgeon: [name], MD;  Location: [facility];  Service: General;  Laterality: Right;  . TUBAL LIGATION      FAMILY HISTORY: family history includes Cancer (age of onset: 88) in her mother; Hypertension in her father and mother.  SOCIAL HISTORY:  reports that she has never smoked. She has never used smokeless tobacco. She reports current alcohol use  of about 3.0 standard drinks of alcohol per week. She reports that she does not use drugs.  ALLERGIES: Isosorbide nitrate  MEDICATIONS:  Current Outpatient Medications  Medication Sig Dispense Refill  . amLODipine-benazepril (LOTREL)  10-20 MG per capsule Take 1 capsule by mouth daily.     . Cholecalciferol (VITAMIN D3) 1.25 MG (50000 UT) CAPS     . diclofenac (VOLTAREN) 75 MG EC tablet Take 1 tablet (75 mg total) by mouth 2 (two) times daily. 60 tablet 0  . ezetimibe (ZETIA) 10 MG tablet     . fluticasone (FLONASE) 50 MCG/ACT nasal spray Place 2 sprays into both nostrils daily as needed for allergies.     . hydrochlorothiazide (MICROZIDE) 12.5 MG capsule Take 12.5 mg by mouth daily as needed (vertigo).     Marland Kitchen lidocaine-prilocaine (EMLA) cream Apply to affected area once 30 g 3  . LORazepam (ATIVAN) 0.5 MG tablet     . LORazepam (ATIVAN) 1 MG tablet Take 1 mg by mouth at bedtime as needed for anxiety or sleep.    . meclizine (ANTIVERT) 25 MG tablet Take 25 mg by mouth 3 (three) times daily as needed (Vertigo).     . metoprolol tartrate (LOPRESSOR) 100 MG tablet     . nitroGLYCERIN (NITROSTAT) 0.4 MG SL tablet Place 0.4 mg under the tongue every 5 (five) minutes as needed for chest pain.     . NON FORMULARY Inhale 1 application into the lungs at bedtime. CPAP    . omeprazole (PRILOSEC) 20 MG capsule Take 20 mg by mouth daily.     Marland Kitchen omeprazole (PRILOSEC) 40 MG capsule     . ondansetron (ZOFRAN) 8 MG tablet Take 1 tablet (8 mg total) by mouth 2 (two) times daily as needed (Nausea or vomiting). 30 tablet 2  . oxyCODONE-acetaminophen (PERCOCET/ROXICET) 5-325 MG tablet Take 1 tablet by mouth every 4 (four) hours as needed for severe pain. 30 tablet 0  . pravastatin (PRAVACHOL) 10 MG tablet     . prochlorperazine (COMPAZINE) 10 MG tablet Take 1 tablet (10 mg total) by mouth every 6 (six) hours as needed (Nausea or vomiting). 60 tablet 2  . venlafaxine XR (EFFEXOR-XR) 75 MG 24 hr capsule Take 75 mg by mouth daily with breakfast.      Current Facility-Administered Medications  Medication Dose Route Frequency Provider Last Rate Last Dose  . betamethasone acetate-betamethasone sodium phosphate (CELESTONE) injection 3 mg  3 mg  Intramuscular Once Evans, Dorathy Daft, DPM        ECOG PERFORMANCE STATUS:  0 - Asymptomatic  REVIEW OF SYSTEMS: Patient denies any weight loss, fatigue, weakness, fever, chills or night sweats. Patient denies any loss of vision, blurred vision. Patient denies any ringing  of the ears or hearing loss. No irregular heartbeat. Patient denies heart murmur or history of fainting. Patient denies any chest pain or pain radiating to her upper extremities. Patient denies any shortness of breath, difficulty breathing at night, cough or hemoptysis. Patient denies any swelling in the lower legs. Patient denies any nausea vomiting, vomiting of blood, or coffee ground material in the vomitus. Patient denies any stomach pain. Patient states has had normal bowel movements no significant constipation or diarrhea. Patient denies any dysuria, hematuria or significant nocturia. Patient denies any problems walking, swelling in the joints or loss of balance. Patient denies any skin changes, loss of hair or loss of weight. Patient denies any excessive worrying or anxiety or significant depression. Patient denies any problems with insomnia.  Patient denies excessive thirst, polyuria, polydipsia. Patient denies any swollen glands, patient denies easy bruising or easy bleeding. Patient denies any recent infections, allergies or URI. Patient "s visual fields have not changed significantly in recent time.   PHYSICAL EXAM: BP (!) 142/93   Pulse 100   Temp (!) 97 F (36.1 C)   Resp 18   Wt 219 lb 5.7 oz (99.5 kg)   BMI 36.50 kg/m  Right breast as a circumferential incision around the nipple areole complex which is well-healed.  No dominant mass or nodularity is noted in either breast in 2 positions examined.  No axillary or supraclavicular adenopathy is appreciated.  Well-developed well-nourished patient in NAD. HEENT reveals PERLA, EOMI, discs not visualized.  Oral cavity is clear. No oral mucosal lesions are identified. Neck is  clear without evidence of cervical or supraclavicular adenopathy. Lungs are clear to A&P. Cardiac examination is essentially unremarkable with regular rate and rhythm without murmur rub or thrill. Abdomen is benign with no organomegaly or masses noted. Motor sensory and DTR levels are equal and symmetric in the upper and lower extremities. Cranial nerves II through XII are grossly intact. Proprioception is intact. No peripheral adenopathy or edema is identified. No motor or sensory levels are noted. Crude visual fields are within normal range.  LABORATORY DATA: Pathology reports reviewed    RADIOLOGY RESULTS: Mammogram and ultrasound reviewed   IMPRESSION: Stage I (T1 cN0 M0) triple positive invasive mammary carcinoma the right breast status post wide local excision and adjuvant chemotherapy in 59 year old female  PLAN: At this time I recommend a course of hypofractionated radiation therapy to her right breast over 3 weeks.  Would also boost her scar another 1400 cGy using electron-beam.  Risks and benefits of treatment including skin reaction fatigue alteration of blood counts possible occlusion of superficial lung all were described in detail to the patient.  She seems to comprehend my treatment plan well.  I have personally set up and ordered CT simulation in about 1 week's time.  Patient also will benefit from antiestrogen therapy at some point in her treatment plan.  We will arrange for her Herceptin maintenance during chemotherapy.  I would like to take this opportunity to thank you for allowing me to participate in the care of your patient.Noreene Filbert, MD

## 2018-12-24 NOTE — Progress Notes (Signed)
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  INTERVAL HISTORY: Patient returns to clinic today for further evaluation and consideration of cycle 12 of 12 of weekly Taxol plus Herceptin.  She has some mild tenderness at her fingernails and has noticed a rash on her forearms, but otherwise feels well. She has no neurologic complaints.  She denies any recent fevers or illnesses.  She has a good appetite and denies weight loss.  She denies any chest pain, shortness of breath, cough, or hemoptysis.  She denies any nausea, vomiting, constipation, or diarrhea.  She has no urinary complaints.  Patient offers no further specific complaints today.  REVIEW OF SYSTEMS:   Review of Systems  Constitutional: Negative.  Negative for fever, malaise/fatigue and weight loss.  Respiratory: Negative.  Negative for cough, hemoptysis and shortness of breath.   Cardiovascular: Negative.  Negative for chest pain and leg swelling.  Gastrointestinal: Negative.  Negative for abdominal pain, blood in stool and melena.  Genitourinary: Negative.  Negative for dysuria.  Musculoskeletal: Negative.  Negative for back pain.  Skin: Positive for rash.  Neurological: Negative.  Negative for seizures, weakness and headaches.  Psychiatric/Behavioral: Negative.  The patient is not nervous/anxious.     As per HPI. Otherwise, a complete review of systems is negative.  PAST MEDICAL HISTORY: Past Medical History:  Diagnosis Date  . Anginal pain ([place])   . Arthritis   . Carpal tunnel syndrome of right wrist   . Coronary atherosclerosis of unspecified type of  vessel, native or graft   . Hand and foot pain   . Hyperlipidemia   . Hypertension   . MVP (mitral valve prolapse)   . Sleep apnea   . Vertigo     PAST SURGICAL HISTORY: Past Surgical History:  Procedure Laterality Date  . BREAST BIOPSY Right 08/27/2018   [place] bx, IMC  . BREAST LUMPECTOMY Right 09/11/2018   path pending  . BREAST LUMPECTOMY WITH NEEDLE LOCALIZATION AND AXILLARY SENTINEL LYMPH NODE BX Right 09/11/2018   Procedure: BREAST LUMPECTOMY WITH NEEDLE LOCALIZATION AND SENTINEL LYMPH NODE BX;  Surgeon: [name], MD;  Location: [facility];  Service: General;  Laterality: Right;  . CARDIAC CATHETERIZATION    . CATARACT EXTRACTION W/PHACO Left 01/01/2017   Procedure: CATARACT EXTRACTION PHACO AND INTRAOCULAR LENS PLACEMENT (IOC)left Restor lens;  Surgeon: Leandrew Koyanagi, MD;  Location: [place];  Service: Ophthalmology;  Laterality: Left;  Restor lens  . CATARACT EXTRACTION W/PHACO Right 01/31/2017   Procedure: CATARACT EXTRACTION PHACO AND INTRAOCULAR LENS PLACEMENT (IOC);  Surgeon: Leandrew Koyanagi, MD;  Location: [place];  Service: Ophthalmology;  Laterality: Right;  . CESAREAN SECTION    . CHOLECYSTECTOMY    . HYSTEROSCOPY  06/2010   D&C  . PORTACATH PLACEMENT Right 09/11/2018   Procedure: INSERTION PORT-A-CATH;  Surgeon: [name], MD;  Location: [facility];  Service: General;  Laterality: Right;  . TUBAL LIGATION      FAMILY HISTORY: Family History  Problem Relation Age of Onset  . Hypertension Mother   . Cancer Mother 67       LUNG  . Hypertension Father   . Breast cancer  Neg Hx     ADVANCED DIRECTIVES (Y/N):  N  HEALTH MAINTENANCE: Social History   Tobacco Use  . Smoking status: Never Smoker  . Smokeless tobacco: Never Used  Substance Use Topics  . Alcohol use: Yes    Alcohol/week: 3.0 standard drinks    Types: 3 Shots of liquor per week    Comment: social drinker  . Drug use: No     Colonoscopy:  PAP:  Bone  density:  Lipid panel:  Allergies  Allergen Reactions  . Isosorbide Nitrate Other (See Comments)    headache    Current Outpatient Medications  Medication Sig Dispense Refill  . amLODipine-benazepril (LOTREL) 10-20 MG per capsule Take 1 capsule by mouth daily.     . Cholecalciferol (VITAMIN D3) 1.25 MG (50000 UT) CAPS     . diclofenac (VOLTAREN) 75 MG EC tablet Take 1 tablet (75 mg total) by mouth 2 (two) times daily. 60 tablet 0  . ezetimibe (ZETIA) 10 MG tablet     . fluticasone (FLONASE) 50 MCG/ACT nasal spray Place 2 sprays into both nostrils daily as needed for allergies.     . hydrochlorothiazide (MICROZIDE) 12.5 MG capsule Take 12.5 mg by mouth daily as needed (vertigo).     Marland Kitchen lidocaine-prilocaine (EMLA) cream Apply to affected area once 30 g 3  . LORazepam (ATIVAN) 0.5 MG tablet     . LORazepam (ATIVAN) 1 MG tablet Take 1 mg by mouth at bedtime as needed for anxiety or sleep.    . meclizine (ANTIVERT) 25 MG tablet Take 25 mg by mouth 3 (three) times daily as needed (Vertigo).     . metoprolol tartrate (LOPRESSOR) 100 MG tablet     . nitroGLYCERIN (NITROSTAT) 0.4 MG SL tablet Place 0.4 mg under the tongue every 5 (five) minutes as needed for chest pain.     . NON FORMULARY Inhale 1 application into the lungs at bedtime. CPAP    . omeprazole (PRILOSEC) 20 MG capsule Take 20 mg by mouth daily.     Marland Kitchen omeprazole (PRILOSEC) 40 MG capsule     . ondansetron (ZOFRAN) 8 MG tablet Take 1 tablet (8 mg total) by mouth 2 (two) times daily as needed (Nausea or vomiting). 30 tablet 2  . oxyCODONE-acetaminophen (PERCOCET/ROXICET) 5-325 MG tablet Take 1 tablet by mouth every 4 (four) hours as needed for severe pain. 30 tablet 0  . pravastatin (PRAVACHOL) 10 MG tablet     . prochlorperazine (COMPAZINE) 10 MG tablet Take 1 tablet (10 mg total) by mouth every 6 (six) hours as needed (Nausea or vomiting). 60 tablet 2  . venlafaxine XR (EFFEXOR-XR) 75 MG 24 hr capsule Take 75 mg by mouth daily with  breakfast.      Current Facility-Administered Medications  Medication Dose Route Frequency Provider Last Rate Last Dose  . betamethasone acetate-betamethasone sodium phosphate (CELESTONE) injection 3 mg  3 mg Intramuscular Once Edrick Kins, DPM       Facility-Administered Medications Ordered in Other Visits  Medication Dose Route Frequency Provider Last Rate Last Dose  . heparin lock flush 100 unit/mL  500 Units Intracatheter Once PRN Lloyd Huger, MD      . PACLitaxel (TAXOL) 168 mg in sodium chloride 0.9 % 250 mL chemo infusion (</= 62m/m2)  80 mg/m2 (Treatment Plan Recorded) Intravenous Once FLloyd Huger MD        OBJECTIVE: Vitals:   12/26/18 0853  BP: 120/88  Pulse: 86  Resp: 18  Temp: 98.1  F (36.7 C)     Body mass index is 36.44 kg/m.    ECOG FS:0 - Asymptomatic  General: Well-developed, well-nourished, no acute distress. Eyes: Pink conjunctiva, anicteric sclera. HEENT: Normocephalic, moist mucous membranes. Breast: Exam deferred today. Lungs: Clear to auscultation bilaterally. Heart: Regular rate and rhythm. No rubs, murmurs, or gallops. Abdomen: Soft, nontender, nondistended. No organomegaly noted, normoactive bowel sounds. Musculoskeletal: No edema, cyanosis, or clubbing. Neuro: Alert, answering all questions appropriately. Cranial nerves grossly intact. Skin: No rashes or petechiae noted. Psych: Normal affect.  LAB RESULTS:  Lab Results  Component Value Date   NA 137 12/26/2018   K 3.9 12/26/2018   CL 107 12/26/2018   CO2 22 12/26/2018   GLUCOSE 112 (H) 12/26/2018   BUN 12 12/26/2018   CREATININE 0.69 12/26/2018   CALCIUM 8.8 (L) 12/26/2018   PROT 6.6 12/26/2018   ALBUMIN 3.3 (L) 12/26/2018   AST 31 12/26/2018   ALT 32 12/26/2018   ALKPHOS 83 12/26/2018   BILITOT 0.2 (L) 12/26/2018   GFRNONAA >60 12/26/2018   GFRAA >60 12/26/2018    Lab Results  Component Value Date   WBC 6.8 12/26/2018   NEUTROABS 3.8 12/26/2018   HGB 11.3  (L) 12/26/2018   HCT 34.2 (L) 12/26/2018   MCV 93.4 12/26/2018   PLT 420 (H) 12/26/2018     STUDIES: Nm Cardiac Muga Rest  Result Date: 12/06/2018 CLINICAL DATA:  Breast cancer, chemotherapy cardiac assessment. EXAM: NUCLEAR MEDICINE CARDIAC BLOOD POOL IMAGING (MUGA) TECHNIQUE: Cardiac multi-gated acquisition was performed at rest following intravenous injection of Tc-62mlabeled red blood cells. RADIOPHARMACEUTICALS:  23.2 mCi Tc-959mertechnetate in-vitro labeled red blood cells IV COMPARISON:  09/24/2018 FINDINGS: Phase and amplitude clots appear within normal limits. Exam quality satisfactory. Over 612 excepted cardiac beats, left ventricular resting ejection fraction was 61%. IMPRESSION: 1. Left ventricular ejection fraction is calculated at 61%, and was previously measured at 62% on 09/24/2018. Electronically Signed   By: WaVan Clines.D.   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  She also had port placement at the same time. Given the fact the patient has HER-2 positive disease, she will require adjuvant chemotherapy with 12 weekly cycles of Taxol and Herceptin followed by maintenance Herceptin every 3 weeks for 12 months.  Patient has appointment with radiation oncology on Jan 01, 2019.  Patient will also benefit from an aromatase inhibitor for 5 years.  Her most recent MUGA scan on December 06, 2018 revealed an EF of 61% which is unchanged from previous.  Proceed with cycle 12 of 12 of weekly Herceptin and Taxol today.  Return to clinic in 3 weeks for further evaluation and continuation of 6 mg/kg maintenance Herceptin only. 2.  Thrombocytosis: Mild, monitor. 3.  Anemia:  Patient's hemoglobin has trended down slightly to 11.3, monitor. 4.  Rash: Unclear etiology.  Continue topical treatments as needed.  I spent a total of 30 minutes face-to-face with the patient of which greater than 50% of the visit was spent in counseling and coordination of care as detailed above.  Patient expressed understanding and was in agreement with this plan. She also understands that She can call clinic at any  time with any questions, concerns, or complaints.   Cancer Staging Malignant neoplasm of upper-inner quadrant of right breast in female, estrogen receptor positive ([place]) Staging form: Breast, AJCC 8th Edition - Clinical stage from 09/06/2018: Stage IA (cT1c, cN0, cM0, G3, ER+, PR+, HER2+) - Signed by Lloyd Huger, MD on 09/06/2018   Lloyd Huger, MD   12/26/2018 11:27 AM

## 2018-12-26 ENCOUNTER — Other Ambulatory Visit: Payer: Self-pay

## 2018-12-26 ENCOUNTER — Inpatient Hospital Stay: Payer: BLUE CROSS/BLUE SHIELD

## 2018-12-26 ENCOUNTER — Encounter: Payer: Self-pay | Admitting: Oncology

## 2018-12-26 ENCOUNTER — Inpatient Hospital Stay (HOSPITAL_BASED_OUTPATIENT_CLINIC_OR_DEPARTMENT_OTHER): Payer: BLUE CROSS/BLUE SHIELD | Admitting: Oncology

## 2018-12-26 VITALS — BP 120/88 | HR 86 | Temp 98.1°F | Resp 18 | Wt 219.0 lb

## 2018-12-26 DIAGNOSIS — D649 Anemia, unspecified: Secondary | ICD-10-CM

## 2018-12-26 DIAGNOSIS — C50211 Malignant neoplasm of upper-inner quadrant of right female breast: Secondary | ICD-10-CM | POA: Diagnosis not present

## 2018-12-26 DIAGNOSIS — D473 Essential (hemorrhagic) thrombocythemia: Secondary | ICD-10-CM | POA: Diagnosis not present

## 2018-12-26 DIAGNOSIS — Z170 Estrogen receptor positive status [ER+]: Principal | ICD-10-CM

## 2018-12-26 DIAGNOSIS — Z79899 Other long term (current) drug therapy: Secondary | ICD-10-CM

## 2018-12-26 LAB — CBC WITH DIFFERENTIAL/PLATELET
Abs Immature Granulocytes: 0.09 10*3/uL — ABNORMAL HIGH (ref 0.00–0.07)
Basophils Absolute: 0.1 10*3/uL (ref 0.0–0.1)
Basophils Relative: 2 %
Eosinophils Absolute: 0.1 10*3/uL (ref 0.0–0.5)
Eosinophils Relative: 1 %
HCT: 34.2 % — ABNORMAL LOW (ref 36.0–46.0)
Hemoglobin: 11.3 g/dL — ABNORMAL LOW (ref 12.0–15.0)
Immature Granulocytes: 1 %
Lymphocytes Relative: 27 %
Lymphs Abs: 1.9 10*3/uL (ref 0.7–4.0)
MCH: 30.9 pg (ref 26.0–34.0)
MCHC: 33 g/dL (ref 30.0–36.0)
MCV: 93.4 fL (ref 80.0–100.0)
Monocytes Absolute: 0.9 10*3/uL (ref 0.1–1.0)
Monocytes Relative: 13 %
Neutro Abs: 3.8 10*3/uL (ref 1.7–7.7)
Neutrophils Relative %: 56 %
Platelets: 420 10*3/uL — ABNORMAL HIGH (ref 150–400)
RBC: 3.66 MIL/uL — ABNORMAL LOW (ref 3.87–5.11)
RDW: 15.2 % (ref 11.5–15.5)
WBC: 6.8 10*3/uL (ref 4.0–10.5)
nRBC: 0 % (ref 0.0–0.2)

## 2018-12-26 LAB — COMPREHENSIVE METABOLIC PANEL
ALT: 32 U/L (ref 0–44)
AST: 31 U/L (ref 15–41)
Albumin: 3.3 g/dL — ABNORMAL LOW (ref 3.5–5.0)
Alkaline Phosphatase: 83 U/L (ref 38–126)
Anion gap: 8 (ref 5–15)
BUN: 12 mg/dL (ref 6–20)
CO2: 22 mmol/L (ref 22–32)
Calcium: 8.8 mg/dL — ABNORMAL LOW (ref 8.9–10.3)
Chloride: 107 mmol/L (ref 98–111)
Creatinine, Ser: 0.69 mg/dL (ref 0.44–1.00)
GFR calc Af Amer: >60 mL/min (ref >60)
GFR calc non Af Amer: >60 mL/min (ref >60)
Glucose, Bld: 112 mg/dL — ABNORMAL HIGH (ref 70–99)
Potassium: 3.9 mmol/L (ref 3.5–5.1)
Sodium: 137 mmol/L (ref 135–145)
Total Bilirubin: 0.2 mg/dL — ABNORMAL LOW (ref 0.3–1.2)
Total Protein: 6.6 g/dL (ref 6.5–8.1)

## 2018-12-26 MED ORDER — TRASTUZUMAB CHEMO 150 MG IV SOLR
2.0000 mg/kg | Freq: Once | INTRAVENOUS | Status: AC
  Administered 2018-12-26: 11:00:00 189 mg via INTRAVENOUS
  Filled 2018-12-26: qty 9

## 2018-12-26 MED ORDER — SODIUM CHLORIDE 0.9 % IV SOLN
80.0000 mg/m2 | Freq: Once | INTRAVENOUS | Status: AC
  Administered 2018-12-26: 12:00:00 168 mg via INTRAVENOUS
  Filled 2018-12-26: qty 28

## 2018-12-26 MED ORDER — ACETAMINOPHEN 325 MG PO TABS
650.0000 mg | ORAL_TABLET | Freq: Once | ORAL | Status: AC
  Administered 2018-12-26: 10:00:00 650 mg via ORAL
  Filled 2018-12-26: qty 2

## 2018-12-26 MED ORDER — DEXAMETHASONE SODIUM PHOSPHATE 10 MG/ML IJ SOLN
10.0000 mg | Freq: Once | INTRAMUSCULAR | Status: AC
  Administered 2018-12-26: 10 mg via INTRAVENOUS
  Filled 2018-12-26: qty 1

## 2018-12-26 MED ORDER — HEPARIN SOD (PORK) LOCK FLUSH 100 UNIT/ML IV SOLN
500.0000 [IU] | Freq: Once | INTRAVENOUS | Status: AC | PRN
  Administered 2018-12-26: 13:00:00 500 [IU]
  Filled 2018-12-26: qty 5

## 2018-12-26 MED ORDER — FAMOTIDINE IN NACL 20-0.9 MG/50ML-% IV SOLN
20.0000 mg | Freq: Once | INTRAVENOUS | Status: AC
  Administered 2018-12-26: 20 mg via INTRAVENOUS
  Filled 2018-12-26: qty 50

## 2018-12-26 MED ORDER — SODIUM CHLORIDE 0.9 % IV SOLN
Freq: Once | INTRAVENOUS | Status: AC
  Administered 2018-12-26: 10:00:00 via INTRAVENOUS
  Filled 2018-12-26: qty 250

## 2018-12-26 MED ORDER — DIPHENHYDRAMINE HCL 50 MG/ML IJ SOLN
25.0000 mg | Freq: Once | INTRAMUSCULAR | Status: AC
  Administered 2018-12-26: 10:00:00 25 mg via INTRAVENOUS
  Filled 2018-12-26: qty 1

## 2018-12-26 NOTE — Progress Notes (Signed)
Patient here today for follow up and treatment consideration breast cancer. Patient reports tenderness and pain in nails, red rash to arms and hands. Patient denies other concerns today.

## 2018-12-31 ENCOUNTER — Other Ambulatory Visit: Payer: Self-pay

## 2019-01-01 ENCOUNTER — Ambulatory Visit
Admission: RE | Admit: 2019-01-01 | Discharge: 2019-01-01 | Disposition: A | Discharge status: Home / Self Care | Payer: BLUE CROSS/BLUE SHIELD | Source: Ambulatory Visit | Attending: Radiation Oncology | Admitting: Radiation Oncology

## 2019-01-01 ENCOUNTER — Other Ambulatory Visit: Payer: Self-pay

## 2019-01-01 DIAGNOSIS — D649 Anemia, unspecified: Secondary | ICD-10-CM | POA: Diagnosis not present

## 2019-01-01 DIAGNOSIS — Z5112 Encounter for antineoplastic immunotherapy: Secondary | ICD-10-CM | POA: Diagnosis not present

## 2019-01-01 DIAGNOSIS — I10 Essential (primary) hypertension: Secondary | ICD-10-CM | POA: Diagnosis not present

## 2019-01-01 DIAGNOSIS — C50211 Malignant neoplasm of upper-inner quadrant of right female breast: Secondary | ICD-10-CM | POA: Insufficient documentation

## 2019-01-01 DIAGNOSIS — Z510 Encounter for antineoplastic radiation therapy: Secondary | ICD-10-CM | POA: Insufficient documentation

## 2019-01-01 DIAGNOSIS — E785 Hyperlipidemia, unspecified: Secondary | ICD-10-CM | POA: Insufficient documentation

## 2019-01-01 DIAGNOSIS — Z79899 Other long term (current) drug therapy: Secondary | ICD-10-CM | POA: Diagnosis not present

## 2019-01-01 DIAGNOSIS — M1990 Unspecified osteoarthritis, unspecified site: Secondary | ICD-10-CM | POA: Diagnosis not present

## 2019-01-01 DIAGNOSIS — G4730 Sleep apnea, unspecified: Secondary | ICD-10-CM | POA: Insufficient documentation

## 2019-01-01 DIAGNOSIS — Z79811 Long term (current) use of aromatase inhibitors: Secondary | ICD-10-CM | POA: Diagnosis not present

## 2019-01-01 DIAGNOSIS — I2510 Atherosclerotic heart disease of native coronary artery without angina pectoris: Secondary | ICD-10-CM | POA: Insufficient documentation

## 2019-01-01 DIAGNOSIS — Z170 Estrogen receptor positive status [ER+]: Secondary | ICD-10-CM | POA: Insufficient documentation

## 2019-01-02 ENCOUNTER — Telehealth: Payer: Self-pay | Admitting: *Deleted

## 2019-01-02 DIAGNOSIS — Z510 Encounter for antineoplastic radiation therapy: Secondary | ICD-10-CM | POA: Diagnosis not present

## 2019-01-02 NOTE — Telephone Encounter (Signed)
Would recommend urgent care, consider COVID testing.

## 2019-01-02 NOTE — Telephone Encounter (Signed)
Patient called reporting that she just doe snot seem to have as much air as usual, she said it is not really shortness of breath. She also reports cough and nasal congestion off and on for past month, but worse this past week. It is worse in the evenings. She also reports  fatigue, but DENIES fever. She states she is to start radiation therapy next week and wants to be sure she is alright before she does. Please Erin Church

## 2019-01-02 NOTE — Telephone Encounter (Signed)
Patient advised of physician response and states she will go to Urgent Care and states she hopes she di not infect anyone with anything when she was in the office yesterday

## 2019-01-03 ENCOUNTER — Other Ambulatory Visit: Payer: Self-pay | Admitting: *Deleted

## 2019-01-03 DIAGNOSIS — Z170 Estrogen receptor positive status [ER+]: Secondary | ICD-10-CM

## 2019-01-03 DIAGNOSIS — C50211 Malignant neoplasm of upper-inner quadrant of right female breast: Secondary | ICD-10-CM

## 2019-01-07 ENCOUNTER — Other Ambulatory Visit: Payer: Self-pay

## 2019-01-08 ENCOUNTER — Ambulatory Visit
Admission: RE | Admit: 2019-01-08 | Discharge: 2019-01-08 | Disposition: A | Discharge status: Home / Self Care | Payer: BLUE CROSS/BLUE SHIELD | Source: Ambulatory Visit | Attending: Radiation Oncology | Admitting: Radiation Oncology

## 2019-01-08 ENCOUNTER — Other Ambulatory Visit: Payer: Self-pay

## 2019-01-08 DIAGNOSIS — Z510 Encounter for antineoplastic radiation therapy: Secondary | ICD-10-CM | POA: Diagnosis not present

## 2019-01-09 ENCOUNTER — Ambulatory Visit
Admission: RE | Admit: 2019-01-09 | Discharge: 2019-01-09 | Disposition: A | Discharge status: Home / Self Care | Payer: BLUE CROSS/BLUE SHIELD | Source: Ambulatory Visit | Attending: Radiation Oncology | Admitting: Radiation Oncology

## 2019-01-09 ENCOUNTER — Other Ambulatory Visit: Payer: Self-pay

## 2019-01-09 DIAGNOSIS — Z510 Encounter for antineoplastic radiation therapy: Secondary | ICD-10-CM | POA: Diagnosis not present

## 2019-01-10 ENCOUNTER — Other Ambulatory Visit: Payer: Self-pay

## 2019-01-10 ENCOUNTER — Ambulatory Visit
Admission: RE | Admit: 2019-01-10 | Discharge: 2019-01-10 | Disposition: A | Discharge status: Home / Self Care | Payer: BLUE CROSS/BLUE SHIELD | Source: Ambulatory Visit | Attending: Radiation Oncology | Admitting: Radiation Oncology

## 2019-01-10 DIAGNOSIS — Z510 Encounter for antineoplastic radiation therapy: Secondary | ICD-10-CM | POA: Diagnosis not present

## 2019-01-13 ENCOUNTER — Other Ambulatory Visit: Payer: Self-pay

## 2019-01-13 ENCOUNTER — Ambulatory Visit
Admission: RE | Admit: 2019-01-13 | Discharge: 2019-01-13 | Disposition: A | Discharge status: Home / Self Care | Payer: BLUE CROSS/BLUE SHIELD | Source: Ambulatory Visit | Attending: Radiation Oncology | Admitting: Radiation Oncology

## 2019-01-13 DIAGNOSIS — Z510 Encounter for antineoplastic radiation therapy: Secondary | ICD-10-CM | POA: Diagnosis not present

## 2019-01-14 ENCOUNTER — Other Ambulatory Visit: Payer: Self-pay

## 2019-01-14 ENCOUNTER — Ambulatory Visit
Admission: RE | Admit: 2019-01-14 | Discharge: 2019-01-14 | Disposition: A | Discharge status: Home / Self Care | Payer: BLUE CROSS/BLUE SHIELD | Source: Ambulatory Visit | Attending: Radiation Oncology | Admitting: Radiation Oncology

## 2019-01-14 DIAGNOSIS — Z510 Encounter for antineoplastic radiation therapy: Secondary | ICD-10-CM | POA: Diagnosis not present

## 2019-01-15 ENCOUNTER — Ambulatory Visit
Admission: RE | Admit: 2019-01-15 | Discharge: 2019-01-15 | Disposition: A | Discharge status: Home / Self Care | Payer: BLUE CROSS/BLUE SHIELD | Source: Ambulatory Visit | Attending: Radiation Oncology | Admitting: Radiation Oncology

## 2019-01-15 ENCOUNTER — Other Ambulatory Visit: Payer: Self-pay

## 2019-01-15 DIAGNOSIS — Z510 Encounter for antineoplastic radiation therapy: Secondary | ICD-10-CM | POA: Diagnosis not present

## 2019-01-16 ENCOUNTER — Inpatient Hospital Stay: Payer: BLUE CROSS/BLUE SHIELD

## 2019-01-16 ENCOUNTER — Encounter: Payer: Self-pay | Admitting: Oncology

## 2019-01-16 ENCOUNTER — Ambulatory Visit
Admission: RE | Admit: 2019-01-16 | Discharge: 2019-01-16 | Disposition: A | Discharge status: Home / Self Care | Payer: BLUE CROSS/BLUE SHIELD | Source: Ambulatory Visit | Attending: Radiation Oncology | Admitting: Radiation Oncology

## 2019-01-16 ENCOUNTER — Other Ambulatory Visit: Payer: Self-pay

## 2019-01-16 ENCOUNTER — Inpatient Hospital Stay (HOSPITAL_BASED_OUTPATIENT_CLINIC_OR_DEPARTMENT_OTHER): Payer: BLUE CROSS/BLUE SHIELD | Admitting: Oncology

## 2019-01-16 VITALS — BP 138/91 | HR 81 | Temp 98.3°F | Wt 220.0 lb

## 2019-01-16 DIAGNOSIS — Z170 Estrogen receptor positive status [ER+]: Secondary | ICD-10-CM | POA: Insufficient documentation

## 2019-01-16 DIAGNOSIS — Z79899 Other long term (current) drug therapy: Secondary | ICD-10-CM | POA: Insufficient documentation

## 2019-01-16 DIAGNOSIS — C50211 Malignant neoplasm of upper-inner quadrant of right female breast: Secondary | ICD-10-CM

## 2019-01-16 DIAGNOSIS — Z5112 Encounter for antineoplastic immunotherapy: Secondary | ICD-10-CM | POA: Insufficient documentation

## 2019-01-16 DIAGNOSIS — M1990 Unspecified osteoarthritis, unspecified site: Secondary | ICD-10-CM | POA: Insufficient documentation

## 2019-01-16 DIAGNOSIS — D649 Anemia, unspecified: Secondary | ICD-10-CM | POA: Insufficient documentation

## 2019-01-16 DIAGNOSIS — I10 Essential (primary) hypertension: Secondary | ICD-10-CM | POA: Insufficient documentation

## 2019-01-16 DIAGNOSIS — I2510 Atherosclerotic heart disease of native coronary artery without angina pectoris: Secondary | ICD-10-CM | POA: Insufficient documentation

## 2019-01-16 DIAGNOSIS — Z79811 Long term (current) use of aromatase inhibitors: Secondary | ICD-10-CM

## 2019-01-16 DIAGNOSIS — Z95828 Presence of other vascular implants and grafts: Secondary | ICD-10-CM

## 2019-01-16 DIAGNOSIS — Z510 Encounter for antineoplastic radiation therapy: Secondary | ICD-10-CM | POA: Diagnosis not present

## 2019-01-16 DIAGNOSIS — E785 Hyperlipidemia, unspecified: Secondary | ICD-10-CM | POA: Insufficient documentation

## 2019-01-16 DIAGNOSIS — G4730 Sleep apnea, unspecified: Secondary | ICD-10-CM | POA: Insufficient documentation

## 2019-01-16 LAB — COMPREHENSIVE METABOLIC PANEL
ALT: 18 U/L (ref 0–44)
AST: 21 U/L (ref 15–41)
Albumin: 3.5 g/dL (ref 3.5–5.0)
Alkaline Phosphatase: 91 U/L (ref 38–126)
Anion gap: 7 (ref 5–15)
BUN: 16 mg/dL (ref 6–20)
CO2: 24 mmol/L (ref 22–32)
Calcium: 9.1 mg/dL (ref 8.9–10.3)
Chloride: 107 mmol/L (ref 98–111)
Creatinine, Ser: 0.68 mg/dL (ref 0.44–1.00)
GFR calc Af Amer: >60 mL/min (ref >60)
GFR calc non Af Amer: >60 mL/min (ref >60)
Glucose, Bld: 104 mg/dL — ABNORMAL HIGH (ref 70–99)
Potassium: 3.8 mmol/L (ref 3.5–5.1)
Sodium: 138 mmol/L (ref 135–145)
Total Bilirubin: 0.5 mg/dL (ref 0.3–1.2)
Total Protein: 7.1 g/dL (ref 6.5–8.1)

## 2019-01-16 LAB — CBC WITH DIFFERENTIAL/PLATELET
Abs Immature Granulocytes: 0.02 10*3/uL (ref 0.00–0.07)
Basophils Absolute: 0.1 10*3/uL (ref 0.0–0.1)
Basophils Relative: 2 %
Eosinophils Absolute: 0.2 10*3/uL (ref 0.0–0.5)
Eosinophils Relative: 2 %
HCT: 35.7 % — ABNORMAL LOW (ref 36.0–46.0)
Hemoglobin: 11.8 g/dL — ABNORMAL LOW (ref 12.0–15.0)
Immature Granulocytes: 0 %
Lymphocytes Relative: 27 %
Lymphs Abs: 2.3 10*3/uL (ref 0.7–4.0)
MCH: 30.8 pg (ref 26.0–34.0)
MCHC: 33.1 g/dL (ref 30.0–36.0)
MCV: 93.2 fL (ref 80.0–100.0)
Monocytes Absolute: 1 10*3/uL (ref 0.1–1.0)
Monocytes Relative: 13 %
Neutro Abs: 4.7 10*3/uL (ref 1.7–7.7)
Neutrophils Relative %: 56 %
Platelets: 390 10*3/uL (ref 150–400)
RBC: 3.83 MIL/uL — ABNORMAL LOW (ref 3.87–5.11)
RDW: 14.6 % (ref 11.5–15.5)
WBC: 8.3 10*3/uL (ref 4.0–10.5)
nRBC: 0 % (ref 0.0–0.2)

## 2019-01-16 MED ORDER — SODIUM CHLORIDE 0.9 % IV SOLN
Freq: Once | INTRAVENOUS | Status: AC
  Administered 2019-01-16: 10:00:00 via INTRAVENOUS
  Filled 2019-01-16: qty 250

## 2019-01-16 MED ORDER — HEPARIN SOD (PORK) LOCK FLUSH 100 UNIT/ML IV SOLN
500.0000 [IU] | Freq: Once | INTRAVENOUS | Status: AC | PRN
  Administered 2019-01-16: 500 [IU]
  Filled 2019-01-16: qty 5

## 2019-01-16 MED ORDER — SODIUM CHLORIDE 0.9% FLUSH
10.0000 mL | Freq: Once | INTRAVENOUS | Status: AC
  Administered 2019-01-16: 09:00:00 10 mL via INTRAVENOUS
  Filled 2019-01-16: qty 10

## 2019-01-16 MED ORDER — DIPHENHYDRAMINE HCL 25 MG PO CAPS
25.0000 mg | ORAL_CAPSULE | Freq: Once | ORAL | Status: AC
  Administered 2019-01-16: 25 mg via ORAL
  Filled 2019-01-16: qty 1

## 2019-01-16 MED ORDER — TRASTUZUMAB CHEMO 150 MG IV SOLR
600.0000 mg | Freq: Once | INTRAVENOUS | Status: AC
  Administered 2019-01-16: 600 mg via INTRAVENOUS
  Filled 2019-01-16: qty 28.57

## 2019-01-16 MED ORDER — ACETAMINOPHEN 325 MG PO TABS
650.0000 mg | ORAL_TABLET | Freq: Once | ORAL | Status: AC
  Administered 2019-01-16: 650 mg via ORAL
  Filled 2019-01-16: qty 2

## 2019-01-16 MED ORDER — DEXAMETHASONE SODIUM PHOSPHATE 10 MG/ML IJ SOLN
10.0000 mg | Freq: Once | INTRAMUSCULAR | Status: AC
  Administered 2019-01-16: 10 mg via INTRAVENOUS
  Filled 2019-01-16: qty 1

## 2019-01-16 NOTE — Progress Notes (Signed)
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  INTERVAL HISTORY: Patient returns to clinic today for further evaluation and continuation of maintenance Herceptin.  She continues to have a mild cough, but otherwise feels well.  She has no neurologic complaints.  She denies any fevers, but recently finished antibiotics for "bronchitis".  She has a good appetite and denies weight loss.  She denies any chest pain, shortness of breath, or hemoptysis.  She denies any nausea, vomiting, constipation, or diarrhea.  She has no urinary complaints.  Patient otherwise feels well and offers no further specific complaints today.  REVIEW OF SYSTEMS:   Review of Systems  Constitutional: Negative.  Negative for fever, malaise/fatigue and weight loss.  Respiratory: Positive for cough. Negative for hemoptysis and shortness of breath.   Cardiovascular: Negative.  Negative for chest pain and leg swelling.  Gastrointestinal: Negative.  Negative for abdominal pain, blood in stool and melena.  Genitourinary: Negative.  Negative for dysuria.  Musculoskeletal: Negative.  Negative for back pain.  Skin: Negative.  Negative for rash.  Neurological: Negative.  Negative for seizures, weakness and headaches.  Psychiatric/Behavioral: Negative.  The patient is not nervous/anxious.     As per HPI. Otherwise, a complete review of systems is negative.  PAST MEDICAL HISTORY: Past Medical History:  Diagnosis Date  . Anginal pain ([place])   . Arthritis   . Carpal tunnel syndrome of right wrist   . Coronary atherosclerosis of unspecified type of vessel,  native or graft   . Hand and foot pain   . Hyperlipidemia   . Hypertension   . MVP (mitral valve prolapse)   . Sleep apnea   . Vertigo     PAST SURGICAL HISTORY: Past Surgical History:  Procedure Laterality Date  . BREAST BIOPSY Right 08/27/2018   [place] bx, IMC  . BREAST LUMPECTOMY Right 09/11/2018   path pending  . BREAST LUMPECTOMY WITH NEEDLE LOCALIZATION AND AXILLARY SENTINEL LYMPH NODE BX Right 09/11/2018   Procedure: BREAST LUMPECTOMY WITH NEEDLE LOCALIZATION AND SENTINEL LYMPH NODE BX;  Surgeon: [name], MD;  Location: [facility];  Service: General;  Laterality: Right;  . CARDIAC CATHETERIZATION    . CATARACT EXTRACTION W/PHACO Left 01/01/2017   Procedure: CATARACT EXTRACTION PHACO AND INTRAOCULAR LENS PLACEMENT (IOC)left Restor lens;  Surgeon: Leandrew Koyanagi, MD;  Location: [place];  Service: Ophthalmology;  Laterality: Left;  Restor lens  . CATARACT EXTRACTION W/PHACO Right 01/31/2017   Procedure: CATARACT EXTRACTION PHACO AND INTRAOCULAR LENS PLACEMENT (IOC);  Surgeon: Leandrew Koyanagi, MD;  Location: [place];  Service: Ophthalmology;  Laterality: Right;  . CESAREAN SECTION    . CHOLECYSTECTOMY    . HYSTEROSCOPY  06/2010   D&C  . PORTACATH PLACEMENT Right 09/11/2018   Procedure: INSERTION PORT-A-CATH;  Surgeon: [name], MD;  Location: [facility];  Service: General;  Laterality: Right;  . TUBAL LIGATION      FAMILY HISTORY: Family History  Problem Relation Age of Onset  . Hypertension Mother   . Cancer Mother 82       LUNG  . Hypertension Father   . Breast cancer Neg Hx     ADVANCED  DIRECTIVES (Y/N):  N  HEALTH MAINTENANCE: Social History   Tobacco Use  . Smoking status: Never Smoker  . Smokeless tobacco: Never Used  Substance Use Topics  . Alcohol use: Yes    Alcohol/week: 3.0 standard drinks    Types: 3 Shots of liquor per week    Comment: social drinker  . Drug use: No     Colonoscopy:  PAP:  Bone  density:  Lipid panel:  Allergies  Allergen Reactions  . Isosorbide Nitrate Other (See Comments)    headache    Current Outpatient Medications  Medication Sig Dispense Refill  . amLODipine-benazepril (LOTREL) 10-20 MG per capsule Take 1 capsule by mouth daily.     . Cholecalciferol (VITAMIN D3) 1.25 MG (50000 UT) CAPS     . diclofenac (VOLTAREN) 75 MG EC tablet Take 1 tablet (75 mg total) by mouth 2 (two) times daily. 60 tablet 0  . ezetimibe (ZETIA) 10 MG tablet     . fluticasone (FLONASE) 50 MCG/ACT nasal spray Place 2 sprays into both nostrils daily as needed for allergies.     . hydrochlorothiazide (MICROZIDE) 12.5 MG capsule Take 12.5 mg by mouth daily as needed (vertigo).     Marland Kitchen lidocaine-prilocaine (EMLA) cream Apply to affected area once 30 g 3  . LORazepam (ATIVAN) 0.5 MG tablet     . LORazepam (ATIVAN) 1 MG tablet Take 1 mg by mouth at bedtime as needed for anxiety or sleep.    . meclizine (ANTIVERT) 25 MG tablet Take 25 mg by mouth 3 (three) times daily as needed (Vertigo).     . metoprolol tartrate (LOPRESSOR) 100 MG tablet     . nitroGLYCERIN (NITROSTAT) 0.4 MG SL tablet Place 0.4 mg under the tongue every 5 (five) minutes as needed for chest pain.     . NON FORMULARY Inhale 1 application into the lungs at bedtime. CPAP    . omeprazole (PRILOSEC) 20 MG capsule Take 20 mg by mouth daily.     Marland Kitchen omeprazole (PRILOSEC) 40 MG capsule     . ondansetron (ZOFRAN) 8 MG tablet Take 1 tablet (8 mg total) by mouth 2 (two) times daily as needed (Nausea or vomiting). 30 tablet 2  . oxyCODONE-acetaminophen (PERCOCET/ROXICET) 5-325 MG tablet Take 1 tablet by mouth every 4 (four) hours as needed for severe pain. 30 tablet 0  . pravastatin (PRAVACHOL) 10 MG tablet     . prochlorperazine (COMPAZINE) 10 MG tablet Take 1 tablet (10 mg total) by mouth every 6 (six) hours as needed (Nausea or vomiting). 60 tablet 2  . venlafaxine XR (EFFEXOR-XR) 75 MG 24 hr capsule Take 75 mg by mouth daily with  breakfast.      Current Facility-Administered Medications  Medication Dose Route Frequency Provider Last Rate Last Dose  . betamethasone acetate-betamethasone sodium phosphate (CELESTONE) injection 3 mg  3 mg Intramuscular Once Daylene Katayama M, DPM        OBJECTIVE: Vitals:   01/16/19 0853  BP: (!) 138/91  Pulse: 81  Temp: 98.3 F (36.8 C)     Body mass index is 36.61 kg/m.    ECOG FS:0 - Asymptomatic  General: Well-developed, well-nourished, no acute distress. Eyes: Pink conjunctiva, anicteric sclera. HEENT: Normocephalic, moist mucous membranes. Breast: Exam deferred today. Lungs: Clear to auscultation bilaterally. Heart: Regular rate and rhythm. No rubs, murmurs, or gallops. Abdomen: Soft, nontender, nondistended. No organomegaly noted, normoactive bowel sounds. Musculoskeletal: No edema, cyanosis, or clubbing. Neuro: Alert, answering all questions appropriately. Cranial nerves grossly  intact. Skin: No rashes or petechiae noted. Psych: Normal affect.  LAB RESULTS:  Lab Results  Component Value Date   NA 138 01/16/2019   K 3.8 01/16/2019   CL 107 01/16/2019   CO2 24 01/16/2019   GLUCOSE 104 (H) 01/16/2019   BUN 16 01/16/2019   CREATININE 0.68 01/16/2019   CALCIUM 9.1 01/16/2019   PROT 7.1 01/16/2019   ALBUMIN 3.5 01/16/2019   AST 21 01/16/2019   ALT 18 01/16/2019   ALKPHOS 91 01/16/2019   BILITOT 0.5 01/16/2019   GFRNONAA >60 01/16/2019   GFRAA >60 01/16/2019    Lab Results  Component Value Date   WBC 8.3 01/16/2019   NEUTROABS 4.7 01/16/2019   HGB 11.8 (L) 01/16/2019   HCT 35.7 (L) 01/16/2019   MCV 93.2 01/16/2019   PLT 390 01/16/2019     STUDIES: No results found.  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  She also had port placement at the same time.  Patient completed 12 weekly cycles of Herceptin plus Taxol on December 26, 2018.  She now will require maintenance Herceptin every 3 weeks for a total of 12 months.  She has initiated adjuvant XRT. Patient will also benefit from an aromatase inhibitor for 5 years.  Her most recent MUGA scan on December 06, 2018 revealed an EF of 61% which is unchanged from previous.  Proceed with cycle 4 of maintenance Herceptin today.  Return to clinic in 3 weeks for further evaluation and consideration of cycle 5.   2.  Thrombocytosis: Resolved. 3.  Anemia: Patient hemoglobin is improved to 11.9. 4.  Rash: Resolved. 5.  Bronchitis: Patient has now completed her antibiotics.  Patient expressed understanding and was in agreement with this plan. She also understands that She can call clinic at any time with any questions, concerns, or complaints.   Cancer Staging Malignant neoplasm of upper-inner quadrant of right breast in female, estrogen receptor positive ([place]) Staging form: Breast, AJCC 8th Edition - Clinical stage from 09/06/2018: Stage IA (cT1c, cN0, cM0, G3, ER+, PR+, HER2+) - Signed by Lloyd Huger, MD on 09/06/2018   Lloyd Huger, MD   01/18/2019 8:53 AM

## 2019-01-16 NOTE — Progress Notes (Signed)
Patient is here today to follow up on her malignant neoplasm of her right breast. Patient stated that she had been doing well. Patient had bronchitis 2 weeks ago.

## 2019-01-16 NOTE — Progress Notes (Signed)
Patient has been receiving herceptin weekly at dose of 2mg /kg. Regimen changing to q3week herceptin. Last dose of 2mg /kg was 3 weeks ago. Verified dose with MD today, no loading dose, start 6mg /kg dose today and then q3weeks thereafter.

## 2019-01-17 ENCOUNTER — Ambulatory Visit
Admission: RE | Admit: 2019-01-17 | Discharge: 2019-01-17 | Disposition: A | Discharge status: Home / Self Care | Payer: BLUE CROSS/BLUE SHIELD | Source: Ambulatory Visit | Attending: Radiation Oncology | Admitting: Radiation Oncology

## 2019-01-17 ENCOUNTER — Other Ambulatory Visit: Payer: Self-pay

## 2019-01-17 DIAGNOSIS — Z510 Encounter for antineoplastic radiation therapy: Secondary | ICD-10-CM | POA: Diagnosis not present

## 2019-01-21 ENCOUNTER — Other Ambulatory Visit: Payer: Self-pay

## 2019-01-21 ENCOUNTER — Ambulatory Visit
Admission: RE | Admit: 2019-01-21 | Discharge: 2019-01-21 | Disposition: A | Discharge status: Home / Self Care | Payer: BLUE CROSS/BLUE SHIELD | Source: Ambulatory Visit | Attending: Radiation Oncology | Admitting: Radiation Oncology

## 2019-01-21 DIAGNOSIS — Z510 Encounter for antineoplastic radiation therapy: Secondary | ICD-10-CM | POA: Diagnosis not present

## 2019-01-22 ENCOUNTER — Other Ambulatory Visit: Payer: Self-pay

## 2019-01-22 ENCOUNTER — Ambulatory Visit
Admission: RE | Admit: 2019-01-22 | Discharge: 2019-01-22 | Disposition: A | Discharge status: Home / Self Care | Payer: BLUE CROSS/BLUE SHIELD | Source: Ambulatory Visit | Attending: Radiation Oncology | Admitting: Radiation Oncology

## 2019-01-22 DIAGNOSIS — Z510 Encounter for antineoplastic radiation therapy: Secondary | ICD-10-CM | POA: Diagnosis not present

## 2019-01-23 ENCOUNTER — Ambulatory Visit
Admission: RE | Admit: 2019-01-23 | Discharge: 2019-01-23 | Disposition: A | Discharge status: Home / Self Care | Payer: BLUE CROSS/BLUE SHIELD | Source: Ambulatory Visit | Attending: Radiation Oncology | Admitting: Radiation Oncology

## 2019-01-23 ENCOUNTER — Other Ambulatory Visit: Payer: Self-pay

## 2019-01-23 ENCOUNTER — Inpatient Hospital Stay: Payer: BLUE CROSS/BLUE SHIELD

## 2019-01-23 DIAGNOSIS — C50211 Malignant neoplasm of upper-inner quadrant of right female breast: Secondary | ICD-10-CM

## 2019-01-23 DIAGNOSIS — Z510 Encounter for antineoplastic radiation therapy: Secondary | ICD-10-CM | POA: Diagnosis not present

## 2019-01-23 LAB — CBC
HCT: 37.7 % (ref 36.0–46.0)
Hemoglobin: 12.4 g/dL (ref 12.0–15.0)
MCH: 30.6 pg (ref 26.0–34.0)
MCHC: 32.9 g/dL (ref 30.0–36.0)
MCV: 93.1 fL (ref 80.0–100.0)
Platelets: 373 10*3/uL (ref 150–400)
RBC: 4.05 MIL/uL (ref 3.87–5.11)
RDW: 14.4 % (ref 11.5–15.5)
WBC: 8.3 10*3/uL (ref 4.0–10.5)
nRBC: 0 % (ref 0.0–0.2)

## 2019-01-24 ENCOUNTER — Ambulatory Visit
Admission: RE | Admit: 2019-01-24 | Discharge: 2019-01-24 | Disposition: A | Discharge status: Home / Self Care | Payer: BLUE CROSS/BLUE SHIELD | Source: Ambulatory Visit | Attending: Radiation Oncology | Admitting: Radiation Oncology

## 2019-01-24 ENCOUNTER — Other Ambulatory Visit: Payer: Self-pay

## 2019-01-24 DIAGNOSIS — Z510 Encounter for antineoplastic radiation therapy: Secondary | ICD-10-CM | POA: Diagnosis not present

## 2019-01-27 ENCOUNTER — Ambulatory Visit
Admission: RE | Admit: 2019-01-27 | Discharge: 2019-01-27 | Disposition: A | Discharge status: Home / Self Care | Payer: BLUE CROSS/BLUE SHIELD | Source: Ambulatory Visit | Attending: Radiation Oncology | Admitting: Radiation Oncology

## 2019-01-27 DIAGNOSIS — C50211 Malignant neoplasm of upper-inner quadrant of right female breast: Secondary | ICD-10-CM | POA: Insufficient documentation

## 2019-01-27 DIAGNOSIS — Z79811 Long term (current) use of aromatase inhibitors: Secondary | ICD-10-CM | POA: Insufficient documentation

## 2019-01-27 DIAGNOSIS — Z5112 Encounter for antineoplastic immunotherapy: Secondary | ICD-10-CM | POA: Diagnosis not present

## 2019-01-27 DIAGNOSIS — I10 Essential (primary) hypertension: Secondary | ICD-10-CM | POA: Diagnosis not present

## 2019-01-27 DIAGNOSIS — Z170 Estrogen receptor positive status [ER+]: Secondary | ICD-10-CM | POA: Insufficient documentation

## 2019-01-27 DIAGNOSIS — Z79899 Other long term (current) drug therapy: Secondary | ICD-10-CM | POA: Insufficient documentation

## 2019-01-27 DIAGNOSIS — D649 Anemia, unspecified: Secondary | ICD-10-CM | POA: Diagnosis not present

## 2019-01-27 DIAGNOSIS — Z510 Encounter for antineoplastic radiation therapy: Secondary | ICD-10-CM | POA: Diagnosis not present

## 2019-01-27 DIAGNOSIS — I2510 Atherosclerotic heart disease of native coronary artery without angina pectoris: Secondary | ICD-10-CM | POA: Diagnosis not present

## 2019-01-27 DIAGNOSIS — M1990 Unspecified osteoarthritis, unspecified site: Secondary | ICD-10-CM | POA: Diagnosis not present

## 2019-01-27 DIAGNOSIS — E785 Hyperlipidemia, unspecified: Secondary | ICD-10-CM | POA: Diagnosis not present

## 2019-01-27 DIAGNOSIS — G4730 Sleep apnea, unspecified: Secondary | ICD-10-CM | POA: Insufficient documentation

## 2019-01-28 ENCOUNTER — Ambulatory Visit
Admission: RE | Admit: 2019-01-28 | Discharge: 2019-01-28 | Disposition: A | Discharge status: Home / Self Care | Payer: BLUE CROSS/BLUE SHIELD | Source: Ambulatory Visit | Attending: Radiation Oncology | Admitting: Radiation Oncology

## 2019-01-28 ENCOUNTER — Other Ambulatory Visit: Payer: Self-pay

## 2019-01-28 DIAGNOSIS — Z510 Encounter for antineoplastic radiation therapy: Secondary | ICD-10-CM | POA: Diagnosis not present

## 2019-01-29 ENCOUNTER — Ambulatory Visit
Admission: RE | Admit: 2019-01-29 | Discharge: 2019-01-29 | Disposition: A | Discharge status: Home / Self Care | Payer: BLUE CROSS/BLUE SHIELD | Source: Ambulatory Visit | Attending: Radiation Oncology | Admitting: Radiation Oncology

## 2019-01-29 ENCOUNTER — Other Ambulatory Visit: Payer: Self-pay

## 2019-01-29 DIAGNOSIS — Z510 Encounter for antineoplastic radiation therapy: Secondary | ICD-10-CM | POA: Diagnosis not present

## 2019-01-30 ENCOUNTER — Ambulatory Visit
Admission: RE | Admit: 2019-01-30 | Discharge: 2019-01-30 | Disposition: A | Discharge status: Home / Self Care | Payer: BLUE CROSS/BLUE SHIELD | Source: Ambulatory Visit | Attending: Radiation Oncology | Admitting: Radiation Oncology

## 2019-01-30 ENCOUNTER — Other Ambulatory Visit: Payer: Self-pay

## 2019-01-30 DIAGNOSIS — Z510 Encounter for antineoplastic radiation therapy: Secondary | ICD-10-CM | POA: Diagnosis not present

## 2019-01-31 ENCOUNTER — Other Ambulatory Visit: Payer: Self-pay

## 2019-01-31 ENCOUNTER — Ambulatory Visit
Admission: RE | Admit: 2019-01-31 | Discharge: 2019-01-31 | Disposition: A | Discharge status: Home / Self Care | Payer: BLUE CROSS/BLUE SHIELD | Source: Ambulatory Visit | Attending: Radiation Oncology | Admitting: Radiation Oncology

## 2019-01-31 DIAGNOSIS — Z510 Encounter for antineoplastic radiation therapy: Secondary | ICD-10-CM | POA: Diagnosis not present

## 2019-02-02 NOTE — Progress Notes (Signed)
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  INTERVAL HISTORY: Patient returns to clinic today for further evaluation and continuation of maintenance Herceptin.  She is nearly finished with adjuvant XRT.  She has some mild skin damage, but otherwise tolerated her treatment well. She has no neurologic complaints.  She denies any recent fevers or illnesses.  She has a good appetite and denies weight loss.  She denies any chest pain, shortness of breath, cough, or hemoptysis.  She denies any nausea, vomiting, constipation, or diarrhea.  She has no urinary complaints.  Patient offers no specific complaints today.  REVIEW OF SYSTEMS:   Review of Systems  Constitutional: Negative.  Negative for fever, malaise/fatigue and weight loss.  Respiratory: Negative.  Negative for cough, hemoptysis and shortness of breath.   Cardiovascular: Negative.  Negative for chest pain and leg swelling.  Gastrointestinal: Negative.  Negative for abdominal pain, blood in stool and melena.  Genitourinary: Negative.  Negative for dysuria.  Musculoskeletal: Negative.  Negative for back pain.  Skin: Negative.  Negative for rash.  Neurological: Negative.  Negative for seizures, weakness and headaches.  Psychiatric/Behavioral: Negative.  The patient is not nervous/anxious.     As per HPI. Otherwise, a complete review of systems is negative.  PAST MEDICAL HISTORY: Past Medical History:  Diagnosis Date  . Anginal pain ([place])   . Arthritis   . Carpal tunnel syndrome of right wrist   . Coronary atherosclerosis of unspecified type of vessel, native or  graft   . Hand and foot pain   . Hyperlipidemia   . Hypertension   . MVP (mitral valve prolapse)   . Sleep apnea   . Vertigo     PAST SURGICAL HISTORY: Past Surgical History:  Procedure Laterality Date  . BREAST BIOPSY Right 08/27/2018   [place] bx, IMC  . BREAST LUMPECTOMY Right 09/11/2018   path pending  . BREAST LUMPECTOMY WITH NEEDLE LOCALIZATION AND AXILLARY SENTINEL LYMPH NODE BX Right 09/11/2018   Procedure: BREAST LUMPECTOMY WITH NEEDLE LOCALIZATION AND SENTINEL LYMPH NODE BX;  Surgeon: [name], MD;  Location: [facility];  Service: General;  Laterality: Right;  . CARDIAC CATHETERIZATION    . CATARACT EXTRACTION W/PHACO Left 01/01/2017   Procedure: CATARACT EXTRACTION PHACO AND INTRAOCULAR LENS PLACEMENT (IOC)left Restor lens;  Surgeon: Leandrew Koyanagi, MD;  Location: [place];  Service: Ophthalmology;  Laterality: Left;  Restor lens  . CATARACT EXTRACTION W/PHACO Right 01/31/2017   Procedure: CATARACT EXTRACTION PHACO AND INTRAOCULAR LENS PLACEMENT (IOC);  Surgeon: Leandrew Koyanagi, MD;  Location: [place];  Service: Ophthalmology;  Laterality: Right;  . CESAREAN SECTION    . CHOLECYSTECTOMY    . HYSTEROSCOPY  06/2010   D&C  . PORTACATH PLACEMENT Right 09/11/2018   Procedure: INSERTION PORT-A-CATH;  Surgeon: [name], MD;  Location: [facility];  Service: General;  Laterality: Right;  . TUBAL LIGATION      FAMILY HISTORY: Family History  Problem Relation Age of Onset  . Hypertension Mother   . Cancer Mother 45       LUNG  . Hypertension Father   . Breast cancer Neg Hx  ADVANCED DIRECTIVES (Y/N):  N  HEALTH MAINTENANCE: Social History   Tobacco Use  . Smoking status: Never Smoker  . Smokeless tobacco: Never Used  Substance Use Topics  . Alcohol use: Yes    Alcohol/week: 3.0 standard drinks    Types: 3 Shots of liquor per week    Comment: social drinker  . Drug use: No     Colonoscopy:  PAP:  Bone density:  Lipid  panel:  Allergies  Allergen Reactions  . Isosorbide Nitrate Other (See Comments)    headache    Current Outpatient Medications  Medication Sig Dispense Refill  . amLODipine-benazepril (LOTREL) 10-20 MG per capsule Take 1 capsule by mouth daily.     . Cholecalciferol (VITAMIN D3) 1.25 MG (50000 UT) CAPS     . diclofenac (VOLTAREN) 75 MG EC tablet Take 1 tablet (75 mg total) by mouth 2 (two) times daily. 60 tablet 0  . ezetimibe (ZETIA) 10 MG tablet     . fluticasone (FLONASE) 50 MCG/ACT nasal spray Place 2 sprays into both nostrils daily as needed for allergies.     . hydrochlorothiazide (MICROZIDE) 12.5 MG capsule Take 12.5 mg by mouth daily as needed (vertigo).     Marland Kitchen lidocaine-prilocaine (EMLA) cream Apply to affected area once 30 g 3  . LORazepam (ATIVAN) 0.5 MG tablet     . LORazepam (ATIVAN) 1 MG tablet Take 1 mg by mouth at bedtime as needed for anxiety or sleep.    . meclizine (ANTIVERT) 25 MG tablet Take 25 mg by mouth 3 (three) times daily as needed (Vertigo).     . metoprolol tartrate (LOPRESSOR) 100 MG tablet     . nitroGLYCERIN (NITROSTAT) 0.4 MG SL tablet Place 0.4 mg under the tongue every 5 (five) minutes as needed for chest pain.     . NON FORMULARY Inhale 1 application into the lungs at bedtime. CPAP    . omeprazole (PRILOSEC) 20 MG capsule Take 20 mg by mouth daily.     Marland Kitchen omeprazole (PRILOSEC) 40 MG capsule     . ondansetron (ZOFRAN) 8 MG tablet Take 1 tablet (8 mg total) by mouth 2 (two) times daily as needed (Nausea or vomiting). 30 tablet 2  . oxyCODONE-acetaminophen (PERCOCET/ROXICET) 5-325 MG tablet Take 1 tablet by mouth every 4 (four) hours as needed for severe pain. 30 tablet 0  . pravastatin (PRAVACHOL) 10 MG tablet     . prochlorperazine (COMPAZINE) 10 MG tablet Take 1 tablet (10 mg total) by mouth every 6 (six) hours as needed (Nausea or vomiting). 60 tablet 2  . venlafaxine XR (EFFEXOR-XR) 75 MG 24 hr capsule Take 75 mg by mouth daily with breakfast.     .  letrozole (FEMARA) 2.5 MG tablet Take 1 tablet (2.5 mg total) by mouth daily. 90 tablet 3   Current Facility-Administered Medications  Medication Dose Route Frequency Provider Last Rate Last Dose  . betamethasone acetate-betamethasone sodium phosphate (CELESTONE) injection 3 mg  3 mg Intramuscular Once Edrick Kins, DPM        OBJECTIVE: Vitals:   02/06/19 0912  BP: (!) 142/89  Pulse: 93  Temp: 98.3 F (36.8 C)     Body mass index is 36.53 kg/m.    ECOG FS:0 - Asymptomatic  General: Well-developed, well-nourished, no acute distress. Eyes: Pink conjunctiva, anicteric sclera. HEENT: Normocephalic, moist mucous membranes. Breast: Exam deferred today. Lungs: Clear to auscultation bilaterally. Heart: Regular rate and rhythm. No rubs, murmurs, or gallops. Abdomen: Soft, nontender, nondistended. No  organomegaly noted, normoactive bowel sounds. Musculoskeletal: No edema, cyanosis, or clubbing. Neuro: Alert, answering all questions appropriately. Cranial nerves grossly intact. Skin: No rashes or petechiae noted. Psych: Normal affect.  LAB RESULTS:  Lab Results  Component Value Date   NA 138 02/06/2019   K 4.0 02/06/2019   CL 105 02/06/2019   CO2 23 02/06/2019   GLUCOSE 106 (H) 02/06/2019   BUN 15 02/06/2019   CREATININE 0.75 02/06/2019   CALCIUM 9.2 02/06/2019   PROT 7.6 02/06/2019   ALBUMIN 3.5 02/06/2019   AST 24 02/06/2019   ALT 22 02/06/2019   ALKPHOS 101 02/06/2019   BILITOT 0.4 02/06/2019   GFRNONAA >60 02/06/2019   GFRAA >60 02/06/2019    Lab Results  Component Value Date   WBC 9.0 02/06/2019   NEUTROABS 4.7 01/16/2019   HGB 12.4 02/06/2019   HCT 37.5 02/06/2019   MCV 92.8 02/06/2019   PLT 391 02/06/2019     STUDIES: No results found.  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 Clinical stage IA ER/PR positive, HER-2 positive invasive carcinoma of the  upper inner quadrant of the right breast: Patient had a lumpectomy on September 11, 2018 confirming stage of disease.  She also had port placement at the same time.  She completed 12 weekly cycles of Herceptin plus Taxol on December 26, 2018.  She now will require maintenance Herceptin every 3 weeks for a total of 12 months.  She will finish adjuvant XRT next week.  Patient was given a prescription for letrozole which she was instructed to initiate at the conclusion of her XRT.  It is recommended she take a minimum of 5 years of treatment completing in June 2025.  Will get a baseline bone mineral density in the next 1 to 2 weeks.  Her most recent MUGA scan on December 06, 2018 revealed an EF of 61% which is unchanged from previous, repeat in July 2020.  Proceed with cycle 6 of maintenance Herceptin today.  Return to clinic in 3 weeks for treatment only and then in 6 weeks for further evaluation and consideration of cycle 8. 2.  Thrombocytosis: Resolved. 3.  Anemia: Resolved. 4.  Rash: Resolved. 5.  Bronchitis: Resolved.  Patient expressed understanding and was in agreement with this plan. She also understands that She can call clinic at any time with any questions, concerns, or complaints.   Cancer Staging Malignant neoplasm of upper-inner quadrant of right breast in female, estrogen receptor positive ([place]) Staging form: Breast, AJCC 8th Edition - Clinical stage from 09/06/2018: Stage IA (cT1c, cN0, cM0, G3, ER+, PR+, HER2+) - Signed by Lloyd Huger, MD on 09/06/2018   Lloyd Huger, MD   02/07/2019 6:51 AM

## 2019-02-03 ENCOUNTER — Ambulatory Visit
Admission: RE | Admit: 2019-02-03 | Discharge: 2019-02-03 | Disposition: A | Discharge status: Home / Self Care | Payer: BLUE CROSS/BLUE SHIELD | Source: Ambulatory Visit | Attending: Radiation Oncology | Admitting: Radiation Oncology

## 2019-02-03 ENCOUNTER — Other Ambulatory Visit: Payer: Self-pay

## 2019-02-03 DIAGNOSIS — Z510 Encounter for antineoplastic radiation therapy: Secondary | ICD-10-CM | POA: Diagnosis not present

## 2019-02-04 ENCOUNTER — Other Ambulatory Visit: Payer: Self-pay

## 2019-02-04 ENCOUNTER — Ambulatory Visit
Admission: RE | Admit: 2019-02-04 | Discharge: 2019-02-04 | Disposition: A | Discharge status: Home / Self Care | Payer: BLUE CROSS/BLUE SHIELD | Source: Ambulatory Visit | Attending: Radiation Oncology | Admitting: Radiation Oncology

## 2019-02-04 DIAGNOSIS — Z510 Encounter for antineoplastic radiation therapy: Secondary | ICD-10-CM | POA: Diagnosis not present

## 2019-02-05 ENCOUNTER — Ambulatory Visit
Admission: RE | Admit: 2019-02-05 | Discharge: 2019-02-05 | Disposition: A | Discharge status: Home / Self Care | Payer: BLUE CROSS/BLUE SHIELD | Source: Ambulatory Visit | Attending: Radiation Oncology | Admitting: Radiation Oncology

## 2019-02-05 ENCOUNTER — Other Ambulatory Visit: Payer: Self-pay

## 2019-02-05 DIAGNOSIS — Z510 Encounter for antineoplastic radiation therapy: Secondary | ICD-10-CM | POA: Diagnosis not present

## 2019-02-06 ENCOUNTER — Other Ambulatory Visit: Payer: Self-pay

## 2019-02-06 ENCOUNTER — Encounter: Payer: Self-pay | Admitting: Oncology

## 2019-02-06 ENCOUNTER — Inpatient Hospital Stay (HOSPITAL_BASED_OUTPATIENT_CLINIC_OR_DEPARTMENT_OTHER): Payer: BLUE CROSS/BLUE SHIELD | Admitting: Oncology

## 2019-02-06 ENCOUNTER — Ambulatory Visit
Admission: RE | Admit: 2019-02-06 | Discharge: 2019-02-06 | Disposition: A | Discharge status: Home / Self Care | Payer: BLUE CROSS/BLUE SHIELD | Source: Ambulatory Visit | Attending: Radiation Oncology | Admitting: Radiation Oncology

## 2019-02-06 ENCOUNTER — Other Ambulatory Visit: Payer: BLUE CROSS/BLUE SHIELD

## 2019-02-06 ENCOUNTER — Ambulatory Visit: Payer: BLUE CROSS/BLUE SHIELD

## 2019-02-06 ENCOUNTER — Inpatient Hospital Stay: Payer: BLUE CROSS/BLUE SHIELD | Attending: Radiation Oncology | Admitting: *Deleted

## 2019-02-06 ENCOUNTER — Encounter: Payer: Self-pay | Admitting: *Deleted

## 2019-02-06 ENCOUNTER — Ambulatory Visit: Payer: BLUE CROSS/BLUE SHIELD | Admitting: Oncology

## 2019-02-06 ENCOUNTER — Inpatient Hospital Stay: Payer: BLUE CROSS/BLUE SHIELD

## 2019-02-06 VITALS — BP 142/89 | HR 93 | Temp 98.3°F | Wt 219.5 lb

## 2019-02-06 DIAGNOSIS — C50211 Malignant neoplasm of upper-inner quadrant of right female breast: Secondary | ICD-10-CM

## 2019-02-06 DIAGNOSIS — Z79899 Other long term (current) drug therapy: Secondary | ICD-10-CM | POA: Insufficient documentation

## 2019-02-06 DIAGNOSIS — Z7951 Long term (current) use of inhaled steroids: Secondary | ICD-10-CM | POA: Diagnosis not present

## 2019-02-06 DIAGNOSIS — Z803 Family history of malignant neoplasm of breast: Secondary | ICD-10-CM

## 2019-02-06 DIAGNOSIS — Z791 Long term (current) use of non-steroidal anti-inflammatories (NSAID): Secondary | ICD-10-CM | POA: Insufficient documentation

## 2019-02-06 DIAGNOSIS — Z79811 Long term (current) use of aromatase inhibitors: Secondary | ICD-10-CM | POA: Diagnosis not present

## 2019-02-06 DIAGNOSIS — Z5112 Encounter for antineoplastic immunotherapy: Secondary | ICD-10-CM | POA: Insufficient documentation

## 2019-02-06 DIAGNOSIS — Z170 Estrogen receptor positive status [ER+]: Secondary | ICD-10-CM

## 2019-02-06 DIAGNOSIS — G4730 Sleep apnea, unspecified: Secondary | ICD-10-CM | POA: Insufficient documentation

## 2019-02-06 DIAGNOSIS — E785 Hyperlipidemia, unspecified: Secondary | ICD-10-CM | POA: Diagnosis not present

## 2019-02-06 DIAGNOSIS — I10 Essential (primary) hypertension: Secondary | ICD-10-CM

## 2019-02-06 DIAGNOSIS — Z95828 Presence of other vascular implants and grafts: Secondary | ICD-10-CM

## 2019-02-06 DIAGNOSIS — Z510 Encounter for antineoplastic radiation therapy: Secondary | ICD-10-CM | POA: Diagnosis not present

## 2019-02-06 LAB — COMPREHENSIVE METABOLIC PANEL
ALT: 22 U/L (ref 0–44)
AST: 24 U/L (ref 15–41)
Albumin: 3.5 g/dL (ref 3.5–5.0)
Alkaline Phosphatase: 101 U/L (ref 38–126)
Anion gap: 10 (ref 5–15)
BUN: 15 mg/dL (ref 6–20)
CO2: 23 mmol/L (ref 22–32)
Calcium: 9.2 mg/dL (ref 8.9–10.3)
Chloride: 105 mmol/L (ref 98–111)
Creatinine, Ser: 0.75 mg/dL (ref 0.44–1.00)
GFR calc Af Amer: >60 mL/min (ref >60)
GFR calc non Af Amer: >60 mL/min (ref >60)
Glucose, Bld: 106 mg/dL — ABNORMAL HIGH (ref 70–99)
Potassium: 4 mmol/L (ref 3.5–5.1)
Sodium: 138 mmol/L (ref 135–145)
Total Bilirubin: 0.4 mg/dL (ref 0.3–1.2)
Total Protein: 7.6 g/dL (ref 6.5–8.1)

## 2019-02-06 LAB — CBC
HCT: 37.5 % (ref 36.0–46.0)
Hemoglobin: 12.4 g/dL (ref 12.0–15.0)
MCH: 30.7 pg (ref 26.0–34.0)
MCHC: 33.1 g/dL (ref 30.0–36.0)
MCV: 92.8 fL (ref 80.0–100.0)
Platelets: 391 10*3/uL (ref 150–400)
RBC: 4.04 MIL/uL (ref 3.87–5.11)
RDW: 13.7 % (ref 11.5–15.5)
WBC: 9 10*3/uL (ref 4.0–10.5)
nRBC: 0 % (ref 0.0–0.2)

## 2019-02-06 MED ORDER — HEPARIN SOD (PORK) LOCK FLUSH 100 UNIT/ML IV SOLN
500.0000 [IU] | Freq: Once | INTRAVENOUS | Status: AC | PRN
  Administered 2019-02-06: 500 [IU]
  Filled 2019-02-06: qty 5

## 2019-02-06 MED ORDER — LETROZOLE 2.5 MG PO TABS: 2.5000 mg | ORAL_TABLET | Freq: Every day | ORAL | 3 refills | Status: DC

## 2019-02-06 MED ORDER — DIPHENHYDRAMINE HCL 25 MG PO CAPS
25.0000 mg | ORAL_CAPSULE | Freq: Once | ORAL | Status: AC
  Administered 2019-02-06: 25 mg via ORAL
  Filled 2019-02-06: qty 1

## 2019-02-06 MED ORDER — TRASTUZUMAB CHEMO 150 MG IV SOLR
600.0000 mg | Freq: Once | INTRAVENOUS | Status: AC
  Administered 2019-02-06: 600 mg via INTRAVENOUS
  Filled 2019-02-06: qty 28.57

## 2019-02-06 MED ORDER — DEXAMETHASONE SODIUM PHOSPHATE 10 MG/ML IJ SOLN
10.0000 mg | Freq: Once | INTRAMUSCULAR | Status: AC
  Administered 2019-02-06: 10 mg via INTRAVENOUS
  Filled 2019-02-06: qty 1

## 2019-02-06 MED ORDER — SODIUM CHLORIDE 0.9% FLUSH
10.0000 mL | Freq: Once | INTRAVENOUS | Status: AC
  Administered 2019-02-06: 10 mL via INTRAVENOUS
  Filled 2019-02-06: qty 10

## 2019-02-06 MED ORDER — ACETAMINOPHEN 325 MG PO TABS
650.0000 mg | ORAL_TABLET | Freq: Once | ORAL | Status: AC
  Administered 2019-02-06: 10:00:00 650 mg via ORAL
  Filled 2019-02-06: qty 2

## 2019-02-06 MED ORDER — SODIUM CHLORIDE 0.9 % IV SOLN
Freq: Once | INTRAVENOUS | Status: AC
  Administered 2019-02-06: 10:00:00 via INTRAVENOUS
  Filled 2019-02-06: qty 250

## 2019-02-06 NOTE — Progress Notes (Signed)
Spoke to patient today.  She is tolerating her treatments well.  States 1 more week of radiation and then to continue with Herceptin.  I asked for her verbal consent to nominate her for [facility]".  If selected [facility] will contact her.  She gave verbal consent to give them her name, phone number, address and email.  She is to call if she has any further questions or needs.

## 2019-02-06 NOTE — Progress Notes (Signed)
Patient stated that she had been doing well with no complaints. 

## 2019-02-07 ENCOUNTER — Ambulatory Visit
Admission: RE | Admit: 2019-02-07 | Discharge: 2019-02-07 | Disposition: A | Discharge status: Home / Self Care | Payer: BLUE CROSS/BLUE SHIELD | Source: Ambulatory Visit | Attending: Radiation Oncology | Admitting: Radiation Oncology

## 2019-02-07 ENCOUNTER — Other Ambulatory Visit: Payer: Self-pay

## 2019-02-07 DIAGNOSIS — Z510 Encounter for antineoplastic radiation therapy: Secondary | ICD-10-CM | POA: Diagnosis not present

## 2019-02-10 ENCOUNTER — Other Ambulatory Visit: Payer: Self-pay

## 2019-02-10 ENCOUNTER — Ambulatory Visit
Admission: RE | Admit: 2019-02-10 | Discharge: 2019-02-10 | Disposition: A | Discharge status: Home / Self Care | Payer: BLUE CROSS/BLUE SHIELD | Source: Ambulatory Visit | Attending: Radiation Oncology | Admitting: Radiation Oncology

## 2019-02-10 DIAGNOSIS — Z510 Encounter for antineoplastic radiation therapy: Secondary | ICD-10-CM | POA: Diagnosis not present

## 2019-02-11 ENCOUNTER — Other Ambulatory Visit: Payer: Self-pay

## 2019-02-11 ENCOUNTER — Ambulatory Visit
Admission: RE | Admit: 2019-02-11 | Discharge: 2019-02-11 | Disposition: A | Discharge status: Home / Self Care | Payer: BLUE CROSS/BLUE SHIELD | Source: Ambulatory Visit | Attending: Radiation Oncology | Admitting: Radiation Oncology

## 2019-02-11 DIAGNOSIS — Z510 Encounter for antineoplastic radiation therapy: Secondary | ICD-10-CM | POA: Diagnosis not present

## 2019-02-12 ENCOUNTER — Other Ambulatory Visit: Payer: Self-pay

## 2019-02-12 ENCOUNTER — Ambulatory Visit
Admission: RE | Admit: 2019-02-12 | Discharge: 2019-02-12 | Disposition: A | Discharge status: Home / Self Care | Payer: BLUE CROSS/BLUE SHIELD | Source: Ambulatory Visit | Attending: Radiation Oncology | Admitting: Radiation Oncology

## 2019-02-12 DIAGNOSIS — Z510 Encounter for antineoplastic radiation therapy: Secondary | ICD-10-CM | POA: Diagnosis not present

## 2019-02-21 ENCOUNTER — Other Ambulatory Visit: Payer: BLUE CROSS/BLUE SHIELD

## 2019-02-24 ENCOUNTER — Other Ambulatory Visit: Payer: Self-pay | Admitting: *Deleted

## 2019-02-24 DIAGNOSIS — Z170 Estrogen receptor positive status [ER+]: Secondary | ICD-10-CM

## 2019-02-24 DIAGNOSIS — C50211 Malignant neoplasm of upper-inner quadrant of right female breast: Secondary | ICD-10-CM

## 2019-02-26 ENCOUNTER — Other Ambulatory Visit: Payer: Self-pay

## 2019-02-27 ENCOUNTER — Inpatient Hospital Stay: Payer: BLUE CROSS/BLUE SHIELD | Attending: Oncology

## 2019-02-27 ENCOUNTER — Other Ambulatory Visit: Payer: Self-pay

## 2019-02-27 ENCOUNTER — Ambulatory Visit: Payer: BLUE CROSS/BLUE SHIELD | Admitting: Obstetrics & Gynecology

## 2019-02-27 VITALS — BP 125/79 | HR 82 | Temp 96.0°F | Resp 20 | Wt 221.0 lb

## 2019-02-27 DIAGNOSIS — Z79899 Other long term (current) drug therapy: Secondary | ICD-10-CM | POA: Diagnosis not present

## 2019-02-27 DIAGNOSIS — C50211 Malignant neoplasm of upper-inner quadrant of right female breast: Secondary | ICD-10-CM | POA: Diagnosis present

## 2019-02-27 DIAGNOSIS — Z79811 Long term (current) use of aromatase inhibitors: Secondary | ICD-10-CM | POA: Diagnosis not present

## 2019-02-27 DIAGNOSIS — Z923 Personal history of irradiation: Secondary | ICD-10-CM | POA: Diagnosis not present

## 2019-02-27 DIAGNOSIS — Z170 Estrogen receptor positive status [ER+]: Secondary | ICD-10-CM | POA: Insufficient documentation

## 2019-02-27 DIAGNOSIS — Z5112 Encounter for antineoplastic immunotherapy: Secondary | ICD-10-CM | POA: Insufficient documentation

## 2019-02-27 MED ORDER — SODIUM CHLORIDE 0.9% FLUSH
10.0000 mL | INTRAVENOUS | Status: DC | PRN
  Administered 2019-02-27: 10 mL
  Filled 2019-02-27: qty 10

## 2019-02-27 MED ORDER — SODIUM CHLORIDE 0.9 % IV SOLN
Freq: Once | INTRAVENOUS | Status: AC
  Administered 2019-02-27: 09:00:00 via INTRAVENOUS
  Filled 2019-02-27: qty 250

## 2019-02-27 MED ORDER — DIPHENHYDRAMINE HCL 25 MG PO CAPS
25.0000 mg | ORAL_CAPSULE | Freq: Once | ORAL | Status: AC
  Administered 2019-02-27: 25 mg via ORAL
  Filled 2019-02-27: qty 1

## 2019-02-27 MED ORDER — DEXAMETHASONE SODIUM PHOSPHATE 10 MG/ML IJ SOLN
10.0000 mg | Freq: Once | INTRAMUSCULAR | Status: AC
  Administered 2019-02-27: 10:00:00 10 mg via INTRAVENOUS
  Filled 2019-02-27: qty 1

## 2019-02-27 MED ORDER — TRASTUZUMAB CHEMO 150 MG IV SOLR
600.0000 mg | Freq: Once | INTRAVENOUS | Status: AC
  Administered 2019-02-27: 600 mg via INTRAVENOUS
  Filled 2019-02-27: qty 28.57

## 2019-02-27 MED ORDER — ACETAMINOPHEN 325 MG PO TABS
650.0000 mg | ORAL_TABLET | Freq: Once | ORAL | Status: AC
  Administered 2019-02-27: 650 mg via ORAL
  Filled 2019-02-27: qty 2

## 2019-02-27 MED ORDER — HEPARIN SOD (PORK) LOCK FLUSH 100 UNIT/ML IV SOLN
500.0000 [IU] | Freq: Once | INTRAVENOUS | Status: AC | PRN
  Administered 2019-02-27: 500 [IU]
  Filled 2019-02-27: qty 5

## 2019-03-03 ENCOUNTER — Other Ambulatory Visit: Payer: Self-pay

## 2019-03-03 DIAGNOSIS — C50211 Malignant neoplasm of upper-inner quadrant of right female breast: Secondary | ICD-10-CM

## 2019-03-03 DIAGNOSIS — Z170 Estrogen receptor positive status [ER+]: Secondary | ICD-10-CM

## 2019-03-16 NOTE — Progress Notes (Signed)
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  INTERVAL HISTORY: Patient returns to clinic today for further evaluation and continuation of maintenance Herceptin.  She has now completed her XRT and started letrozole several weeks ago.  She is tolerating this well without significant side effects.  She currently feels well and is asymptomatic. She has no neurologic complaints.  She denies any recent fevers or illnesses.  She has a good appetite and denies weight loss.  She denies any chest pain, shortness of breath, cough, or hemoptysis.  She denies any nausea, vomiting, constipation, or diarrhea.  She has no urinary complaints.  Patient feels at her baseline offers no specific complaints today.    REVIEW OF SYSTEMS:   Review of Systems  Constitutional: Negative.  Negative for fever, malaise/fatigue and weight loss.  Respiratory: Negative.  Negative for cough, hemoptysis and shortness of breath.   Cardiovascular: Negative.  Negative for chest pain and leg swelling.  Gastrointestinal: Negative.  Negative for abdominal pain, blood in stool and melena.  Genitourinary: Negative.  Negative for dysuria.  Musculoskeletal: Negative.  Negative for back pain.  Skin: Negative.  Negative for rash.  Neurological: Negative.  Negative for seizures, weakness and headaches.  Psychiatric/Behavioral: Negative.  The patient is not nervous/anxious.     As per HPI. Otherwise, a complete review of systems is negative.  PAST MEDICAL HISTORY: Past Medical History:  Diagnosis Date  . Anginal pain ([place])   . Arthritis   . Carpal tunnel  syndrome of right wrist   . Coronary atherosclerosis of unspecified type of vessel, native or graft   . Hand and foot pain   . Hyperlipidemia   . Hypertension   . MVP (mitral valve prolapse)   . Sleep apnea   . Vertigo     PAST SURGICAL HISTORY: Past Surgical History:  Procedure Laterality Date  . BREAST BIOPSY Right 08/27/2018   [place] bx, IMC  . BREAST LUMPECTOMY Right 09/11/2018   path pending  . BREAST LUMPECTOMY WITH NEEDLE LOCALIZATION AND AXILLARY SENTINEL LYMPH NODE BX Right 09/11/2018   Procedure: BREAST LUMPECTOMY WITH NEEDLE LOCALIZATION AND SENTINEL LYMPH NODE BX;  Surgeon: [name], MD;  Location: [facility];  Service: General;  Laterality: Right;  . CARDIAC CATHETERIZATION    . CATARACT EXTRACTION W/PHACO Left 01/01/2017   Procedure: CATARACT EXTRACTION PHACO AND INTRAOCULAR LENS PLACEMENT (IOC)left Restor lens;  Surgeon: Leandrew Koyanagi, MD;  Location: [place];  Service: Ophthalmology;  Laterality: Left;  Restor lens  . CATARACT EXTRACTION W/PHACO Right 01/31/2017   Procedure: CATARACT EXTRACTION PHACO AND INTRAOCULAR LENS PLACEMENT (IOC);  Surgeon: Leandrew Koyanagi, MD;  Location: [place];  Service: Ophthalmology;  Laterality: Right;  . CESAREAN SECTION    . CHOLECYSTECTOMY    . HYSTEROSCOPY  06/2010   D&C  . PORTACATH PLACEMENT Right 09/11/2018   Procedure: INSERTION PORT-A-CATH;  Surgeon: [name], MD;  Location: [facility];  Service: General;  Laterality: Right;  . TUBAL LIGATION      FAMILY HISTORY: Family History  Problem Relation Age of Onset  . Hypertension Mother   . Cancer Mother 61  LUNG  . Hypertension Father   . Breast cancer Neg Hx     ADVANCED DIRECTIVES (Y/N):  N  HEALTH MAINTENANCE: Social History   Tobacco Use  . Smoking status: Never Smoker  . Smokeless tobacco: Never Used  Substance Use Topics  . Alcohol use: Yes    Alcohol/week: 3.0 standard drinks    Types: 3 Shots of liquor per week     Comment: social drinker  . Drug use: No     Colonoscopy:  PAP:  Bone density:  Lipid panel:  Allergies  Allergen Reactions  . Isosorbide Nitrate Other (See Comments)    headache    Current Outpatient Medications  Medication Sig Dispense Refill  . amLODipine-benazepril (LOTREL) 10-20 MG per capsule Take 1 capsule by mouth daily.     . Cholecalciferol (VITAMIN D3) 1.25 MG (50000 UT) CAPS     . diclofenac (VOLTAREN) 75 MG EC tablet Take 1 tablet (75 mg total) by mouth 2 (two) times daily. 60 tablet 0  . ezetimibe (ZETIA) 10 MG tablet     . fluticasone (FLONASE) 50 MCG/ACT nasal spray Place 2 sprays into both nostrils daily as needed for allergies.     . hydrochlorothiazide (MICROZIDE) 12.5 MG capsule Take 12.5 mg by mouth daily as needed (vertigo).     Marland Kitchen letrozole (FEMARA) 2.5 MG tablet Take 1 tablet (2.5 mg total) by mouth daily. 90 tablet 3  . lidocaine-prilocaine (EMLA) cream Apply to affected area once 30 g 3  . LORazepam (ATIVAN) 0.5 MG tablet     . LORazepam (ATIVAN) 1 MG tablet Take 1 mg by mouth at bedtime as needed for anxiety or sleep.    . meclizine (ANTIVERT) 25 MG tablet Take 25 mg by mouth 3 (three) times daily as needed (Vertigo).     . metoprolol tartrate (LOPRESSOR) 100 MG tablet     . nitroGLYCERIN (NITROSTAT) 0.4 MG SL tablet Place 0.4 mg under the tongue every 5 (five) minutes as needed for chest pain.     . NON FORMULARY Inhale 1 application into the lungs at bedtime. CPAP    . omeprazole (PRILOSEC) 20 MG capsule Take 20 mg by mouth daily.     Marland Kitchen omeprazole (PRILOSEC) 40 MG capsule     . ondansetron (ZOFRAN) 8 MG tablet Take 1 tablet (8 mg total) by mouth 2 (two) times daily as needed (Nausea or vomiting). 30 tablet 2  . oxyCODONE-acetaminophen (PERCOCET/ROXICET) 5-325 MG tablet Take 1 tablet by mouth every 4 (four) hours as needed for severe pain. 30 tablet 0  . pravastatin (PRAVACHOL) 10 MG tablet     . prochlorperazine (COMPAZINE) 10 MG tablet Take 1 tablet  (10 mg total) by mouth every 6 (six) hours as needed (Nausea or vomiting). 60 tablet 2  . venlafaxine XR (EFFEXOR-XR) 75 MG 24 hr capsule Take 75 mg by mouth daily with breakfast.      Current Facility-Administered Medications  Medication Dose Route Frequency Provider Last Rate Last Dose  . betamethasone acetate-betamethasone sodium phosphate (CELESTONE) injection 3 mg  3 mg Intramuscular Once Daylene Katayama M, DPM        OBJECTIVE: Vitals:   03/20/19 0938  BP: 137/86  Pulse: 84  Temp: (!) 97 F (36.1 C)     Body mass index is 36.44 kg/m.    ECOG FS:0 - Asymptomatic  General: Well-developed, well-nourished, no acute distress. Eyes: Pink conjunctiva, anicteric sclera. HEENT: Normocephalic, moist mucous membranes. Lungs: Clear to auscultation bilaterally. Heart: Regular rate  and rhythm. No rubs, murmurs, or gallops. Abdomen: Soft, nontender, nondistended. No organomegaly noted, normoactive bowel sounds. Musculoskeletal: No edema, cyanosis, or clubbing. Neuro: Alert, answering all questions appropriately. Cranial nerves grossly intact. Skin: No rashes or petechiae noted. Psych: Normal affect.  LAB RESULTS:  Lab Results  Component Value Date   NA 137 03/20/2019   K 4.0 03/20/2019   CL 107 03/20/2019   CO2 23 03/20/2019   GLUCOSE 102 (H) 03/20/2019   BUN 14 03/20/2019   CREATININE 0.77 03/20/2019   CALCIUM 9.1 03/20/2019   PROT 7.7 03/20/2019   ALBUMIN 3.5 03/20/2019   AST 23 03/20/2019   ALT 19 03/20/2019   ALKPHOS 89 03/20/2019   BILITOT 0.4 03/20/2019   GFRNONAA >60 03/20/2019   GFRAA >60 03/20/2019    Lab Results  Component Value Date   WBC 7.3 03/20/2019   NEUTROABS 4.1 03/20/2019   HGB 13.2 03/20/2019   HCT 39.6 03/20/2019   MCV 90.4 03/20/2019   PLT 364 03/20/2019     STUDIES: No results found.  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 Clinical stage  IA ER/PR positive, HER-2 positive invasive carcinoma of the upper inner quadrant of the right breast: Patient had a lumpectomy on September 11, 2018 confirming stage of disease.  She also had port placement at the same time.  She completed 12 weekly cycles of Herceptin plus Taxol on December 26, 2018.  She now will require maintenance Herceptin every 3 weeks for a total of 12 months.  She has now completed XRT.  She has initiated letrozole which she will take for a total of 5 years completing treatment in July 2025.  Patient will require baseline bone mineral density in the next several weeks. Her most recent MUGA scan on December 06, 2018 revealed an EF of 61% which is unchanged from previous, repeat in the next 1 to 2 weeks.  Proceed with cycle 8 of maintenance Herceptin today.  Return to clinic in 3 weeks for treatment only.  Patient will then return to clinic in 6 weeks for further evaluation and consideration of cycle 10.   I spent a total of 30 minutes face-to-face with the patient of which greater than 50% of the visit was spent in counseling and coordination of care as detailed above.   Patient expressed understanding and was in agreement with this plan. She also understands that She can call clinic at any time with any questions, concerns, or complaints.   Cancer Staging Malignant neoplasm of upper-inner quadrant of right breast in female, estrogen receptor positive ([place]) Staging form: Breast, AJCC 8th Edition - Clinical stage from 09/06/2018: Stage IA (cT1c, cN0, cM0, G3, ER+, PR+, HER2+) - Signed by Lloyd Huger, MD on 09/06/2018   Lloyd Huger, MD   03/21/2019 6:37 AM

## 2019-03-20 ENCOUNTER — Other Ambulatory Visit: Payer: Self-pay

## 2019-03-20 ENCOUNTER — Inpatient Hospital Stay (HOSPITAL_BASED_OUTPATIENT_CLINIC_OR_DEPARTMENT_OTHER): Payer: BLUE CROSS/BLUE SHIELD | Admitting: Oncology

## 2019-03-20 ENCOUNTER — Encounter: Payer: Self-pay | Admitting: Oncology

## 2019-03-20 ENCOUNTER — Inpatient Hospital Stay: Payer: BLUE CROSS/BLUE SHIELD

## 2019-03-20 VITALS — BP 137/86 | HR 84 | Temp 97.0°F | Wt 219.0 lb

## 2019-03-20 DIAGNOSIS — C50211 Malignant neoplasm of upper-inner quadrant of right female breast: Secondary | ICD-10-CM

## 2019-03-20 DIAGNOSIS — Z170 Estrogen receptor positive status [ER+]: Secondary | ICD-10-CM

## 2019-03-20 DIAGNOSIS — Z79811 Long term (current) use of aromatase inhibitors: Secondary | ICD-10-CM | POA: Diagnosis not present

## 2019-03-20 DIAGNOSIS — Z95828 Presence of other vascular implants and grafts: Secondary | ICD-10-CM

## 2019-03-20 DIAGNOSIS — Z923 Personal history of irradiation: Secondary | ICD-10-CM | POA: Diagnosis not present

## 2019-03-20 DIAGNOSIS — Z79899 Other long term (current) drug therapy: Secondary | ICD-10-CM

## 2019-03-20 LAB — COMPREHENSIVE METABOLIC PANEL
ALT: 19 U/L (ref 0–44)
AST: 23 U/L (ref 15–41)
Albumin: 3.5 g/dL (ref 3.5–5.0)
Alkaline Phosphatase: 89 U/L (ref 38–126)
Anion gap: 7 (ref 5–15)
BUN: 14 mg/dL (ref 6–20)
CO2: 23 mmol/L (ref 22–32)
Calcium: 9.1 mg/dL (ref 8.9–10.3)
Chloride: 107 mmol/L (ref 98–111)
Creatinine, Ser: 0.77 mg/dL (ref 0.44–1.00)
GFR calc Af Amer: >60 mL/min (ref >60)
GFR calc non Af Amer: >60 mL/min (ref >60)
Glucose, Bld: 102 mg/dL — ABNORMAL HIGH (ref 70–99)
Potassium: 4 mmol/L (ref 3.5–5.1)
Sodium: 137 mmol/L (ref 135–145)
Total Bilirubin: 0.4 mg/dL (ref 0.3–1.2)
Total Protein: 7.7 g/dL (ref 6.5–8.1)

## 2019-03-20 LAB — CBC WITH DIFFERENTIAL/PLATELET
Abs Immature Granulocytes: 0.03 10*3/uL (ref 0.00–0.07)
Basophils Absolute: 0.1 10*3/uL (ref 0.0–0.1)
Basophils Relative: 1 %
Eosinophils Absolute: 0.1 10*3/uL (ref 0.0–0.5)
Eosinophils Relative: 2 %
HCT: 39.6 % (ref 36.0–46.0)
Hemoglobin: 13.2 g/dL (ref 12.0–15.0)
Immature Granulocytes: 0 %
Lymphocytes Relative: 29 %
Lymphs Abs: 2.1 10*3/uL (ref 0.7–4.0)
MCH: 30.1 pg (ref 26.0–34.0)
MCHC: 33.3 g/dL (ref 30.0–36.0)
MCV: 90.4 fL (ref 80.0–100.0)
Monocytes Absolute: 0.9 10*3/uL (ref 0.1–1.0)
Monocytes Relative: 12 %
Neutro Abs: 4.1 10*3/uL (ref 1.7–7.7)
Neutrophils Relative %: 56 %
Platelets: 364 10*3/uL (ref 150–400)
RBC: 4.38 MIL/uL (ref 3.87–5.11)
RDW: 12.5 % (ref 11.5–15.5)
WBC: 7.3 10*3/uL (ref 4.0–10.5)
nRBC: 0 % (ref 0.0–0.2)

## 2019-03-20 MED ORDER — DEXAMETHASONE SODIUM PHOSPHATE 10 MG/ML IJ SOLN
10.0000 mg | Freq: Once | INTRAMUSCULAR | Status: AC
  Administered 2019-03-20: 10 mg via INTRAVENOUS
  Filled 2019-03-20: qty 1

## 2019-03-20 MED ORDER — ACETAMINOPHEN 325 MG PO TABS
650.0000 mg | ORAL_TABLET | Freq: Once | ORAL | Status: AC
  Administered 2019-03-20: 650 mg via ORAL
  Filled 2019-03-20: qty 2

## 2019-03-20 MED ORDER — SODIUM CHLORIDE 0.9 % IV SOLN
Freq: Once | INTRAVENOUS | Status: AC
  Administered 2019-03-20: 11:00:00 via INTRAVENOUS
  Filled 2019-03-20: qty 250

## 2019-03-20 MED ORDER — DIPHENHYDRAMINE HCL 25 MG PO CAPS
25.0000 mg | ORAL_CAPSULE | Freq: Once | ORAL | Status: AC
  Administered 2019-03-20: 11:00:00 25 mg via ORAL
  Filled 2019-03-20: qty 1

## 2019-03-20 MED ORDER — SODIUM CHLORIDE 0.9% FLUSH
10.0000 mL | Freq: Once | INTRAVENOUS | Status: AC
  Administered 2019-03-20: 10 mL via INTRAVENOUS
  Filled 2019-03-20: qty 10

## 2019-03-20 MED ORDER — TRASTUZUMAB CHEMO 150 MG IV SOLR
600.0000 mg | Freq: Once | INTRAVENOUS | Status: AC
  Administered 2019-03-20: 11:00:00 600 mg via INTRAVENOUS
  Filled 2019-03-20: qty 28.57

## 2019-03-20 MED ORDER — HEPARIN SOD (PORK) LOCK FLUSH 100 UNIT/ML IV SOLN
500.0000 [IU] | Freq: Once | INTRAVENOUS | Status: AC | PRN
  Administered 2019-03-20: 12:00:00 500 [IU]
  Filled 2019-03-20: qty 5

## 2019-03-20 NOTE — Progress Notes (Signed)
Patient stated that she had been doing well with no complaints. 

## 2019-03-24 ENCOUNTER — Ambulatory Visit: Payer: BLUE CROSS/BLUE SHIELD | Admitting: Radiation Oncology

## 2019-03-28 ENCOUNTER — Encounter
Admission: RE | Admit: 2019-03-28 | Discharge: 2019-03-28 | Disposition: A | Discharge status: Home / Self Care | Payer: BLUE CROSS/BLUE SHIELD | Source: Ambulatory Visit | Attending: Oncology | Admitting: Oncology

## 2019-03-28 ENCOUNTER — Other Ambulatory Visit: Payer: Self-pay

## 2019-03-28 DIAGNOSIS — C50211 Malignant neoplasm of upper-inner quadrant of right female breast: Secondary | ICD-10-CM | POA: Insufficient documentation

## 2019-03-28 DIAGNOSIS — Z170 Estrogen receptor positive status [ER+]: Secondary | ICD-10-CM | POA: Insufficient documentation

## 2019-03-28 MED ORDER — TECHNETIUM TC 99M-LABELED RED BLOOD CELLS IV KIT
25.0000 | PACK | Freq: Once | INTRAVENOUS | Status: AC | PRN
  Administered 2019-03-28: 15:00:00 24.74 via INTRAVENOUS

## 2019-04-01 ENCOUNTER — Ambulatory Visit
Admission: RE | Admit: 2019-04-01 | Discharge: 2019-04-01 | Disposition: A | Discharge status: Home / Self Care | Payer: BLUE CROSS/BLUE SHIELD | Source: Ambulatory Visit | Attending: Oncology | Admitting: Oncology

## 2019-04-01 ENCOUNTER — Other Ambulatory Visit: Payer: Self-pay

## 2019-04-01 DIAGNOSIS — C50211 Malignant neoplasm of upper-inner quadrant of right female breast: Secondary | ICD-10-CM | POA: Diagnosis present

## 2019-04-01 DIAGNOSIS — Z170 Estrogen receptor positive status [ER+]: Secondary | ICD-10-CM | POA: Insufficient documentation

## 2019-04-01 HISTORY — DX: Malignant neoplasm of unspecified site of unspecified female breast: C50.919

## 2019-04-05 ENCOUNTER — Other Ambulatory Visit: Payer: Self-pay | Admitting: Oncology

## 2019-04-10 ENCOUNTER — Other Ambulatory Visit: Payer: Self-pay

## 2019-04-10 ENCOUNTER — Inpatient Hospital Stay: Payer: BLUE CROSS/BLUE SHIELD | Attending: Oncology

## 2019-04-10 ENCOUNTER — Other Ambulatory Visit: Payer: BLUE CROSS/BLUE SHIELD

## 2019-04-10 VITALS — BP 160/96 | HR 82 | Temp 98.1°F | Resp 18

## 2019-04-10 DIAGNOSIS — Z170 Estrogen receptor positive status [ER+]: Secondary | ICD-10-CM

## 2019-04-10 DIAGNOSIS — C50211 Malignant neoplasm of upper-inner quadrant of right female breast: Secondary | ICD-10-CM | POA: Diagnosis present

## 2019-04-10 DIAGNOSIS — Z5112 Encounter for antineoplastic immunotherapy: Secondary | ICD-10-CM | POA: Diagnosis present

## 2019-04-10 MED ORDER — TRASTUZUMAB-ANNS CHEMO 150 MG IV SOLR
600.0000 mg | Freq: Once | INTRAVENOUS | Status: AC
  Administered 2019-04-10: 600 mg via INTRAVENOUS
  Filled 2019-04-10: qty 28.57

## 2019-04-10 MED ORDER — DIPHENHYDRAMINE HCL 25 MG PO CAPS
25.0000 mg | ORAL_CAPSULE | Freq: Once | ORAL | Status: AC
  Administered 2019-04-10: 25 mg via ORAL
  Filled 2019-04-10: qty 1

## 2019-04-10 MED ORDER — ACETAMINOPHEN 325 MG PO TABS
650.0000 mg | ORAL_TABLET | Freq: Once | ORAL | Status: AC
  Administered 2019-04-10: 650 mg via ORAL
  Filled 2019-04-10: qty 2

## 2019-04-10 MED ORDER — SODIUM CHLORIDE 0.9 % IV SOLN
Freq: Once | INTRAVENOUS | Status: AC
  Administered 2019-04-10: 14:00:00 via INTRAVENOUS
  Filled 2019-04-10: qty 250

## 2019-04-10 MED ORDER — DEXAMETHASONE SODIUM PHOSPHATE 10 MG/ML IJ SOLN
10.0000 mg | Freq: Once | INTRAMUSCULAR | Status: AC
  Administered 2019-04-10: 10 mg via INTRAVENOUS
  Filled 2019-04-10: qty 1

## 2019-04-10 MED ORDER — HEPARIN SOD (PORK) LOCK FLUSH 100 UNIT/ML IV SOLN
500.0000 [IU] | Freq: Once | INTRAVENOUS | Status: AC | PRN
  Administered 2019-04-10: 500 [IU]
  Filled 2019-04-10: qty 5

## 2019-04-10 NOTE — Progress Notes (Signed)
See VS flowsheet.  1308: Pt reports that since starting Letrozole she has been feeling "fatigue, moody, ill, crying one second and ill the next" Pt reports that she experiences "sweats" after taking the medication, and that she "aches in my joints in my hands, feet and knees" occasionally. B/P at this time 156/92. Pt states she has not taken "my blood pressure medication since the night before last". Rulon Abide NP aware and will review symptoms with Dr. Grayland Ormond, okay to proceed with Herceptin treatment.  1401: B/P 160/96 okay to proceed with the Decadron and Herceptin Per Rulon Abide NP.  [phone]: Per Dr. Grayland Ormond pt to hold Letrozole until patient's next visit with MD on 05/01/2019. Pt aware and Verbalizes understanding. Pt stable at discharge.

## 2019-04-16 ENCOUNTER — Ambulatory Visit: Payer: BLUE CROSS/BLUE SHIELD | Admitting: Obstetrics & Gynecology

## 2019-04-22 ENCOUNTER — Ambulatory Visit
Admission: RE | Admit: 2019-04-22 | Discharge: 2019-04-22 | Disposition: A | Discharge status: Home / Self Care | Payer: BLUE CROSS/BLUE SHIELD | Source: Ambulatory Visit | Attending: Surgery | Admitting: Surgery

## 2019-04-22 ENCOUNTER — Ambulatory Visit
Admission: RE | Admit: 2019-04-22 | Discharge: 2019-04-22 | Disposition: A | Discharge status: Home / Self Care | Payer: BLUE CROSS/BLUE SHIELD | Source: Ambulatory Visit | Attending: General Surgery | Admitting: General Surgery

## 2019-04-22 DIAGNOSIS — C50211 Malignant neoplasm of upper-inner quadrant of right female breast: Secondary | ICD-10-CM

## 2019-04-22 DIAGNOSIS — Z170 Estrogen receptor positive status [ER+]: Secondary | ICD-10-CM | POA: Insufficient documentation

## 2019-04-22 HISTORY — DX: Personal history of antineoplastic chemotherapy: Z92.21

## 2019-04-22 HISTORY — DX: Personal history of irradiation: Z92.3

## 2019-04-26 NOTE — Progress Notes (Signed)
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  INTERVAL HISTORY: Patient returns to clinic today for further evaluation and continuation of maintenance Kanjinti.  Patient had significant side effects to letrozole which included severe hot flashes and joint pain and she discontinued this 3 weeks ago with resolution of her symptoms.  She currently feels well and is asymptomatic.  She has no neurologic complaints.  She denies any recent fevers or illnesses.  She has a good appetite and denies weight loss.  She denies any chest pain, shortness of breath, cough, or hemoptysis.  She denies any nausea, vomiting, constipation, or diarrhea.  She has no urinary complaints.  Patient offers no further specific complaints today.  REVIEW OF SYSTEMS:   Review of Systems  Constitutional: Negative.  Negative for fever, malaise/fatigue and weight loss.  Respiratory: Negative.  Negative for cough, hemoptysis and shortness of breath.   Cardiovascular: Negative.  Negative for chest pain and leg swelling.  Gastrointestinal: Negative.  Negative for abdominal pain, blood in stool and melena.  Genitourinary: Negative.  Negative for dysuria.  Musculoskeletal: Negative.  Negative for back pain.  Skin: Negative.  Negative for rash.  Neurological: Negative.  Negative for seizures, weakness and headaches.  Psychiatric/Behavioral: Negative.  The patient is not nervous/anxious.     As per HPI. Otherwise, a complete review of systems is negative.  PAST MEDICAL HISTORY: Past Medical History:  Diagnosis Date   Anginal pain ([place])    Arthritis     Breast cancer ([place])    Carpal tunnel syndrome of right wrist    Coronary atherosclerosis of unspecified type of vessel, native or graft    Hand and foot pain    Hyperlipidemia    Hypertension    MVP (mitral valve prolapse)    Personal history of chemotherapy    Personal history of radiation therapy    Sleep apnea    Vertigo     PAST SURGICAL HISTORY: Past Surgical History:  Procedure Laterality Date   BREAST BIOPSY Right 08/27/2018   [place] bx, [facility]   BREAST LUMPECTOMY Right 09/11/2018   BREAST LUMPECTOMY WITH NEEDLE LOCALIZATION AND AXILLARY SENTINEL LYMPH NODE BX Right 09/11/2018   Procedure: BREAST LUMPECTOMY WITH NEEDLE LOCALIZATION AND SENTINEL LYMPH NODE BX;  Surgeon: [name], MD;  Location: [facility];  Service: General;  Laterality: Right;   CARDIAC CATHETERIZATION     CATARACT EXTRACTION W/PHACO Left 01/01/2017   Procedure: CATARACT EXTRACTION PHACO AND INTRAOCULAR LENS PLACEMENT (IOC)left Restor lens;  Surgeon: Leandrew Koyanagi, MD;  Location: [place];  Service: Ophthalmology;  Laterality: Left;  Restor lens   CATARACT EXTRACTION W/PHACO Right 01/31/2017   Procedure: CATARACT EXTRACTION PHACO AND INTRAOCULAR LENS PLACEMENT (IOC);  Surgeon: Leandrew Koyanagi, MD;  Location: [place];  Service: Ophthalmology;  Laterality: Right;   CESAREAN SECTION     CHOLECYSTECTOMY     HYSTEROSCOPY  06/2010   D&C   PORTACATH PLACEMENT Right 09/11/2018   Procedure: INSERTION PORT-A-CATH;  Surgeon: [name], MD;  Location: [facility];  Service: General;  Laterality: Right;   TUBAL LIGATION      FAMILY HISTORY: Family History  Problem Relation Age  of Onset   Hypertension Mother    Cancer Mother 52       LUNG   Hypertension Father    Breast cancer Neg Hx     ADVANCED DIRECTIVES (Y/N):  N  HEALTH MAINTENANCE: Social History   Tobacco Use   Smoking status: Never Smoker   Smokeless tobacco: Never Used  Substance Use  Topics   Alcohol use: Yes    Alcohol/week: 3.0 standard drinks    Types: 3 Shots of liquor per week    Comment: social drinker   Drug use: No     Colonoscopy:  PAP:  Bone density:  Lipid panel:  Allergies  Allergen Reactions   Isosorbide Nitrate Other (See Comments)    headache    Current Outpatient Medications  Medication Sig Dispense Refill   amLODipine-benazepril (LOTREL) 10-20 MG per capsule Take 1 capsule by mouth daily.      Cholecalciferol (VITAMIN D3) 1.25 MG (50000 UT) CAPS      diclofenac (VOLTAREN) 75 MG EC tablet Take 1 tablet (75 mg total) by mouth 2 (two) times daily. 60 tablet 0   ezetimibe (ZETIA) 10 MG tablet      fluticasone (FLONASE) 50 MCG/ACT nasal spray Place 2 sprays into both nostrils daily as needed for allergies.      hydrochlorothiazide (MICROZIDE) 12.5 MG capsule Take 12.5 mg by mouth daily as needed (vertigo).      lidocaine-prilocaine (EMLA) cream Apply to affected area once 30 g 3   LORazepam (ATIVAN) 0.5 MG tablet      LORazepam (ATIVAN) 1 MG tablet Take 1 mg by mouth at bedtime as needed for anxiety or sleep.     meclizine (ANTIVERT) 25 MG tablet Take 25 mg by mouth 3 (three) times daily as needed (Vertigo).      metoprolol tartrate (LOPRESSOR) 100 MG tablet      nitroGLYCERIN (NITROSTAT) 0.4 MG SL tablet Place 0.4 mg under the tongue every 5 (five) minutes as needed for chest pain.      NON FORMULARY Inhale 1 application into the lungs at bedtime. CPAP     omeprazole (PRILOSEC) 20 MG capsule Take 20 mg by mouth daily.      omeprazole (PRILOSEC) 40 MG capsule      ondansetron (ZOFRAN) 8 MG tablet Take 1 tablet (8 mg total) by mouth 2 (two) times daily as needed (Nausea or vomiting). 30 tablet 2   oxyCODONE-acetaminophen (PERCOCET/ROXICET) 5-325 MG tablet Take 1 tablet by mouth every 4 (four) hours as needed for severe pain. 30 tablet 0   pravastatin (PRAVACHOL) 10 MG tablet      prochlorperazine (COMPAZINE) 10 MG tablet  Take 1 tablet (10 mg total) by mouth every 6 (six) hours as needed (Nausea or vomiting). 60 tablet 2   venlafaxine XR (EFFEXOR-XR) 75 MG 24 hr capsule Take 75 mg by mouth daily with breakfast.      letrozole (FEMARA) 2.5 MG tablet Take 1 tablet (2.5 mg total) by mouth daily. (Patient not taking: Reported on 04/30/2019) 90 tablet 3   Current Facility-Administered Medications  Medication Dose Route Frequency Provider Last Rate Last Dose   betamethasone acetate-betamethasone sodium phosphate (CELESTONE) injection 3 mg  3 mg Intramuscular Once Edrick Kins, DPM       Facility-Administered Medications Ordered in Other Visits  Medication Dose Route Frequency Provider Last Rate Last Dose   heparin lock flush 100 unit/mL  500 Units Intravenous Once Lloyd Huger, MD  sodium chloride flush (NS) 0.9 % injection 10 mL  10 mL Intravenous PRN Lloyd Huger, MD        OBJECTIVE: Vitals:   05/01/19 0944  BP: (!) 142/92  Pulse: 74  Resp: 18  Temp: 97.9 F (36.6 C)     Body mass index is 36.81 kg/m.    ECOG FS:0 - Asymptomatic  General: Well-developed, well-nourished, no acute distress. Eyes: Pink conjunctiva, anicteric sclera. HEENT: Normocephalic, moist mucous membranes. Breast: Exam deferred today. Lungs: Clear to auscultation bilaterally. Heart: Regular rate and rhythm. No rubs, murmurs, or gallops. Abdomen: Soft, nontender, nondistended. No organomegaly noted, normoactive bowel sounds. Musculoskeletal: No edema, cyanosis, or clubbing. Neuro: Alert, answering all questions appropriately. Cranial nerves grossly intact. Skin: No rashes or petechiae noted. Psych: Normal affect.  LAB RESULTS:  Lab Results  Component Value Date   NA 137 05/01/2019   K 4.0 05/01/2019   CL 106 05/01/2019   CO2 22 05/01/2019   GLUCOSE 105 (H) 05/01/2019   BUN 14 05/01/2019   CREATININE 0.68 05/01/2019   CALCIUM 9.2 05/01/2019   PROT 7.6 05/01/2019   ALBUMIN 3.7 05/01/2019   AST 22  05/01/2019   ALT 20 05/01/2019   ALKPHOS 88 05/01/2019   BILITOT 0.5 05/01/2019   GFRNONAA >60 05/01/2019   GFRAA >60 05/01/2019    Lab Results  Component Value Date   WBC 7.0 05/01/2019   NEUTROABS 4.2 05/01/2019   HGB 13.2 05/01/2019   HCT 40.2 05/01/2019   MCV 88.5 05/01/2019   PLT 365 05/01/2019     STUDIES: Dg Bone Density  Result Date: 04/01/2019 EXAM: DUAL X-RAY ABSORPTIOMETRY (DXA) FOR BONE MINERAL DENSITY IMPRESSION: Dear Dr Grayland Ormond, Your patient Worthy Flank Inabinet completed a FRAX assessment on 04/01/2019 using the Brady (analysis version: 14.10) manufactured by [employer]. The following summarizes the results of our evaluation. PATIENT BIOGRAPHICAL: Name: Breindy, Meadow Patient ID: [id number] Birth Date: 22-Feb-1961 Height:    65.5 in. Gender:     Female    [age]       Weight:    221.7 lbs. Ethnicity:  White                            Exam Date: 04/01/2019 FRAX* RESULTS:  (version: 3.5) 10-year Probability of Fracture1 Major Osteoporotic Fracture2 Hip Fracture 6.0% 0.2% Population: [place] (Caucasian) Risk Factors: None Based on Femur (Left) Neck BMD 1 -The 10-year probability of fracture may be lower than reported if the patient has received treatment. 2 -Major Osteoporotic Fracture: Clinical Spine, Forearm, Hip or Shoulder *FRAX is a Materials engineer of the [employer] for Metabolic Bone Disease, a [place] (WHO) [facility]. ASSESSMENT: The probability of a major osteoporotic fracture is 6.0% within the next ten years. The probability of a hip fracture is 0.2% within the next ten years. . Technologist: SCE PATIENT BIOGRAPHICAL: Name: Abigial, [place] Patient ID: [id number] Birth Date: [birth year] Height: 65.5 in. Gender: Female Exam Date: 04/01/2019 Weight: 221.7 lbs. Indications: Breast CA, Caucasian, Early Menopause, High Risk Meds, History of Chemo, History of Radiation, Postmenopausal Fractures:  Treatments: Femara, Flonase, Omeprazole, Vitamin D ASSESSMENT: The BMD measured at AP Spine L1-L4 is 0.998 g/cm2 with a T-score of -1.6. This patient is considered osteopenic according to [facility]) criteria. The quality of the scan is good. Site Region Measured Measured WHO  Young Adult BMD Date       Age      Classification T-score AP Spine L1-L4 04/01/2019 59.0 Osteopenia -1.6 0.998 g/cm2 DualFemur Neck Left 04/01/2019 59.0 Normal -0.7 0.937 g/cm2 World Health Organization [facility]) criteria for post-menopausal, Caucasian Women: Normal:       T-score at or above -1 SD Osteopenia:   T-score between -1 and -2.5 SD Osteoporosis: T-score at or below -2.5 SD RECOMMENDATIONS: 1. All patients should optimize calcium and vitamin D intake. 2. Consider FDA-approved medical therapies in postmenopausal women and men aged 21 years and older, based on the following: a. A hip or vertebral(clinical or morphometric) fracture b. T-score < -2.5 at the femoral neck or spine after appropriate evaluation to exclude secondary causes c. Low bone mass (T-score between -1.0 and -2.5 at the femoral neck or spine) and a 10-year probability of a hip fracture > 3% or a 10-year probability of a major osteoporosis-related fracture > 20% based on the [place]-adapted WHO algorithm d. Clinician judgment and/or patient preferences may indicate treatment for people with 10-year fracture probabilities above or below these levels FOLLOW-UP: People with diagnosed cases of osteoporosis or at high risk for fracture should have regular bone mineral density tests. For patients eligible for Medicare, routine testing is allowed once every 2 years. The testing frequency can be increased to one year for patients who have rapidly progressing disease, those who are receiving or discontinuing medical therapy to restore bone mass, or have additional risk factors. I have reviewed this report, and agree with the above findings. [facility]  Electronically Signed   By: Lowella Grip III M.D.   On: 04/01/2019 14:23   Mm Diag Breast Tomo Uni Right  Result Date: 04/22/2019 CLINICAL DATA:  Routine six-month follow-up of the right breast following lumpectomy, radiation therapy and chemotherapy for right breast cancer in January 2020. EXAM: DIGITAL DIAGNOSTIC UNILATERAL RIGHT MAMMOGRAM WITH CAD AND TOMO COMPARISON:  Previous exam(s). ACR Breast Density Category c: The breast tissue is heterogeneously dense, which may obscure small masses. FINDINGS: Interval post lumpectomy and postradiation changes on the right with an approximately 7 cm postoperative seroma and small oval areas of fatty tissue in the lumpectomy bed in the medial aspect of the breast. The wire and clip seen on 09/11/2018 are surgically absent. No findings elsewhere in the breast suspicious for malignancy. A right-sided Port-A-Cath is noted. Mammographic images were processed with CAD. IMPRESSION: Benign post lumpectomy and postradiation changes on the right. No interval evidence of malignancy. RECOMMENDATION: Bilateral diagnostic mammogram in 4 months. That will be 1 year since mammographic evaluation of the left breast. I have discussed the findings and recommendations with the patient. Results were also provided in writing at the conclusion of the visit. If applicable, a reminder letter will be sent to the patient regarding the next appointment. BI-RADS CATEGORY  2: Benign. Electronically Signed   By: Claudie Revering M.D.   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  She also had port placement at the same time.  She completed 12 weekly cycles of Herceptin plus Taxol on  December 26, 2018.  She now will require maintenance Kanjinti (bio similar to Herceptin)  every 3 weeks for a total of 12 months.  She has now completed XRT.  Patient has significant side effects of letrozole, therefore has been switched to anastrozole.  She will be required to take a total of 5 years of treatment completing in July 2025.  Patient's most recent MUGA scan on March 28, 2019 was unchanged with an EF of 61%.  Mammogram on April 22, 2019 was reported as BI-RADS 2, repeat in August 2021.  Proceed with cycle 10 of maintenance Kanjinti today.  Return to clinic in 3 weeks for treatment only and then in 6 weeks for further evaluation and consideration of cycle 12.   2.  Osteopenia: Patient had a baseline bone mineral density on April 01, 2019 that revealed a T score of -1.6.  Have recommended calcium and vitamin D supplementation.  Repeat in August 2021.  I spent a total of 30 minutes face-to-face with the patient of which greater than 50% of the visit was spent in counseling and coordination of care as detailed above.   Patient expressed understanding and was in agreement with this plan. She also understands that She can call clinic at any time with any questions, concerns, or complaints.   Cancer Staging Malignant neoplasm of upper-inner quadrant of right breast in female, estrogen receptor positive ([place]) Staging form: Breast, AJCC 8th Edition - Clinical stage from 09/06/2018: Stage IA (cT1c, cN0, cM0, G3, ER+, PR+, HER2+) - Signed by Lloyd Huger, MD on 09/06/2018   Lloyd Huger, MD   05/01/2019 11:26 AM

## 2019-04-29 ENCOUNTER — Ambulatory Visit: Payer: BLUE CROSS/BLUE SHIELD | Admitting: General Surgery

## 2019-04-30 ENCOUNTER — Encounter: Payer: Self-pay | Admitting: Oncology

## 2019-04-30 ENCOUNTER — Other Ambulatory Visit: Payer: Self-pay

## 2019-04-30 NOTE — Progress Notes (Signed)
Patient stated that she had stopped taking her Letrozole because it was causing her to feel bad all over. Patient also stated that it would cause for her to have hot flashes.

## 2019-05-01 ENCOUNTER — Inpatient Hospital Stay (HOSPITAL_BASED_OUTPATIENT_CLINIC_OR_DEPARTMENT_OTHER): Payer: BLUE CROSS/BLUE SHIELD | Admitting: Oncology

## 2019-05-01 ENCOUNTER — Inpatient Hospital Stay: Payer: BLUE CROSS/BLUE SHIELD | Attending: Oncology

## 2019-05-01 ENCOUNTER — Other Ambulatory Visit: Payer: Self-pay

## 2019-05-01 ENCOUNTER — Inpatient Hospital Stay: Payer: BLUE CROSS/BLUE SHIELD

## 2019-05-01 VITALS — BP 142/92 | HR 74 | Temp 97.9°F | Resp 18 | Wt 221.2 lb

## 2019-05-01 DIAGNOSIS — C50211 Malignant neoplasm of upper-inner quadrant of right female breast: Secondary | ICD-10-CM | POA: Insufficient documentation

## 2019-05-01 DIAGNOSIS — Z5112 Encounter for antineoplastic immunotherapy: Secondary | ICD-10-CM | POA: Diagnosis not present

## 2019-05-01 DIAGNOSIS — M8580 Other specified disorders of bone density and structure, unspecified site: Secondary | ICD-10-CM | POA: Insufficient documentation

## 2019-05-01 DIAGNOSIS — Z170 Estrogen receptor positive status [ER+]: Secondary | ICD-10-CM

## 2019-05-01 LAB — COMPREHENSIVE METABOLIC PANEL
ALT: 20 U/L (ref 0–44)
AST: 22 U/L (ref 15–41)
Albumin: 3.7 g/dL (ref 3.5–5.0)
Alkaline Phosphatase: 88 U/L (ref 38–126)
Anion gap: 9 (ref 5–15)
BUN: 14 mg/dL (ref 6–20)
CO2: 22 mmol/L (ref 22–32)
Calcium: 9.2 mg/dL (ref 8.9–10.3)
Chloride: 106 mmol/L (ref 98–111)
Creatinine, Ser: 0.68 mg/dL (ref 0.44–1.00)
GFR calc Af Amer: >60 mL/min (ref >60)
GFR calc non Af Amer: >60 mL/min (ref >60)
Glucose, Bld: 105 mg/dL — ABNORMAL HIGH (ref 70–99)
Potassium: 4 mmol/L (ref 3.5–5.1)
Sodium: 137 mmol/L (ref 135–145)
Total Bilirubin: 0.5 mg/dL (ref 0.3–1.2)
Total Protein: 7.6 g/dL (ref 6.5–8.1)

## 2019-05-01 LAB — CBC WITH DIFFERENTIAL/PLATELET
Abs Immature Granulocytes: 0.01 10*3/uL (ref 0.00–0.07)
Basophils Absolute: 0.1 10*3/uL (ref 0.0–0.1)
Basophils Relative: 1 %
Eosinophils Absolute: 0.1 10*3/uL (ref 0.0–0.5)
Eosinophils Relative: 2 %
HCT: 40.2 % (ref 36.0–46.0)
Hemoglobin: 13.2 g/dL (ref 12.0–15.0)
Immature Granulocytes: 0 %
Lymphocytes Relative: 24 %
Lymphs Abs: 1.7 10*3/uL (ref 0.7–4.0)
MCH: 29.1 pg (ref 26.0–34.0)
MCHC: 32.8 g/dL (ref 30.0–36.0)
MCV: 88.5 fL (ref 80.0–100.0)
Monocytes Absolute: 1 10*3/uL (ref 0.1–1.0)
Monocytes Relative: 14 %
Neutro Abs: 4.2 10*3/uL (ref 1.7–7.7)
Neutrophils Relative %: 59 %
Platelets: 365 10*3/uL (ref 150–400)
RBC: 4.54 MIL/uL (ref 3.87–5.11)
RDW: 13.2 % (ref 11.5–15.5)
WBC: 7 10*3/uL (ref 4.0–10.5)
nRBC: 0 % (ref 0.0–0.2)

## 2019-05-01 MED ORDER — SODIUM CHLORIDE 0.9% FLUSH
10.0000 mL | INTRAVENOUS | Status: DC | PRN
  Filled 2019-05-01: qty 10

## 2019-05-01 MED ORDER — HEPARIN SOD (PORK) LOCK FLUSH 100 UNIT/ML IV SOLN
500.0000 [IU] | Freq: Once | INTRAVENOUS | Status: DC
  Filled 2019-05-01: qty 5

## 2019-05-01 MED ORDER — SODIUM CHLORIDE 0.9 % IV SOLN
Freq: Once | INTRAVENOUS | Status: AC
  Administered 2019-05-01: 10:00:00 via INTRAVENOUS
  Filled 2019-05-01: qty 250

## 2019-05-01 MED ORDER — DEXAMETHASONE SODIUM PHOSPHATE 10 MG/ML IJ SOLN
10.0000 mg | Freq: Once | INTRAMUSCULAR | Status: AC
  Administered 2019-05-01: 10 mg via INTRAVENOUS
  Filled 2019-05-01: qty 1

## 2019-05-01 MED ORDER — HEPARIN SOD (PORK) LOCK FLUSH 100 UNIT/ML IV SOLN
500.0000 [IU] | Freq: Once | INTRAVENOUS | Status: AC
  Administered 2019-05-01: 11:00:00 500 [IU] via INTRAVENOUS

## 2019-05-01 MED ORDER — SODIUM CHLORIDE 0.9% FLUSH
10.0000 mL | Freq: Once | INTRAVENOUS | Status: AC
  Administered 2019-05-01: 10 mL via INTRAVENOUS
  Filled 2019-05-01: qty 10

## 2019-05-01 MED ORDER — ACETAMINOPHEN 325 MG PO TABS
650.0000 mg | ORAL_TABLET | Freq: Once | ORAL | Status: AC
  Administered 2019-05-01: 11:00:00 650 mg via ORAL
  Filled 2019-05-01: qty 2

## 2019-05-01 MED ORDER — TRASTUZUMAB-ANNS CHEMO 150 MG IV SOLR
600.0000 mg | Freq: Once | INTRAVENOUS | Status: AC
  Administered 2019-05-01: 11:00:00 600 mg via INTRAVENOUS
  Filled 2019-05-01: qty 28.6

## 2019-05-01 MED ORDER — DIPHENHYDRAMINE HCL 25 MG PO CAPS
25.0000 mg | ORAL_CAPSULE | Freq: Once | ORAL | Status: AC
  Administered 2019-05-01: 11:00:00 25 mg via ORAL
  Filled 2019-05-01: qty 1

## 2019-05-06 ENCOUNTER — Ambulatory Visit: Payer: BLUE CROSS/BLUE SHIELD | Admitting: General Surgery

## 2019-05-06 ENCOUNTER — Encounter: Payer: Self-pay | Admitting: General Surgery

## 2019-05-06 ENCOUNTER — Other Ambulatory Visit: Payer: Self-pay

## 2019-05-06 VITALS — BP 134/90 | HR 91 | Temp 97.9°F | Ht 65.0 in | Wt 223.0 lb

## 2019-05-06 DIAGNOSIS — Z170 Estrogen receptor positive status [ER+]: Secondary | ICD-10-CM | POA: Diagnosis not present

## 2019-05-06 DIAGNOSIS — C50211 Malignant neoplasm of upper-inner quadrant of right female breast: Secondary | ICD-10-CM | POA: Diagnosis not present

## 2019-05-06 NOTE — Patient Instructions (Addendum)
Due in December for Bilateral Diagnostic Mammogram and office follow up.   Continue self breast exams. Call office for any new breast issues or concerns.

## 2019-05-06 NOTE — Progress Notes (Signed)
Patient ID: Erin Church, female   DOB: [birth year], 58 y.o.   MRN: [id number]  Chief Complaint  Patient presents with  . Follow-up    Breast Cancer    HPI Erin Church is a 58 y.o. female.   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  She underwent a lumpectomy and sentinel node biopsy with Dr. Tama High, back in January of this year.  She has completed chemotherapy and radiation.  She receives Lobbyist (Building surveyor similar to herceptin) every 3 weeks.  She has not tolerated letrozole, complaining of significant fatigue and joint pain.  She saw Dr. Grayland Ormond last week and will be changing to anastrozole.  She had a diagnostic mammogram of the right breast on 825.  There were no concerning findings identified.  She continues to perform breast self exams and denies any new lumps, masses, or skin changes.  She denies any nipple discharge.  No issues with upper extremity lymphedema.  Recent cardiac MUGA showed preserved cardiac function.  A bone density study showed osteopenia in her lumbar spine, but no concern for osteoporosis.  Femoral neck bone density was within normal limits.   Past Medical History:  Diagnosis Date  . Anginal pain ([place])   . Arthritis   . Breast cancer ([place])   . Carpal tunnel syndrome of right wrist   . Coronary atherosclerosis of unspecified type of vessel, native or graft   . Hand and foot pain   . Hyperlipidemia   . Hypertension   . MVP (mitral valve prolapse)   . Personal history of chemotherapy   . Personal history of radiation therapy   . Sleep apnea   . Vertigo     Past Surgical History:  Procedure Laterality Date  . BREAST BIOPSY Right 08/27/2018   [place] bx, [facility]  . BREAST LUMPECTOMY Right 09/11/2018  . BREAST LUMPECTOMY WITH NEEDLE LOCALIZATION AND AXILLARY SENTINEL LYMPH NODE BX Right 09/11/2018   Procedure: BREAST LUMPECTOMY WITH NEEDLE  LOCALIZATION AND SENTINEL LYMPH NODE BX;  Surgeon: [name], MD;  Location: [facility];  Service: General;  Laterality: Right;  . CARDIAC CATHETERIZATION    . CATARACT EXTRACTION W/PHACO Left 01/01/2017   Procedure: CATARACT EXTRACTION PHACO AND INTRAOCULAR LENS PLACEMENT (IOC)left Restor lens;  Surgeon: Leandrew Koyanagi, MD;  Location: [facility];  Service: Ophthalmology;  Laterality: Left;  Restor lens  . CATARACT EXTRACTION W/PHACO Right 01/31/2017   Procedure: CATARACT EXTRACTION PHACO AND INTRAOCULAR LENS PLACEMENT (IOC);  Surgeon: Leandrew Koyanagi, MD;  Location: [facility];  Service: Ophthalmology;  Laterality: Right;  . CESAREAN SECTION    . CHOLECYSTECTOMY    . HYSTEROSCOPY  06/2010   D&C  . PORTACATH PLACEMENT Right 09/11/2018   Procedure: INSERTION PORT-A-CATH;  Surgeon: [name], MD;  Location: [facility];  Service: General;  Laterality: Right;  . TUBAL LIGATION      Family History  Problem Relation Age of Onset  . Hypertension Mother   . Cancer Mother 37       LUNG  . Hypertension Father   . Breast cancer Neg Hx     Social History Social History   Tobacco Use  . Smoking status: Never Smoker  . Smokeless tobacco: Never Used  Substance Use Topics  . Alcohol use: Yes    Alcohol/week: 3.0 standard drinks    Types: 3 Shots of liquor per week  Comment: social drinker  . Drug use: No    Allergies  Allergen Reactions  . Isosorbide Nitrate Other (See Comments)    headache    Current Outpatient Medications  Medication Sig Dispense Refill  . amLODipine-benazepril (LOTREL) 10-20 MG per capsule Take 1 capsule by mouth daily.     . Cholecalciferol (VITAMIN D3) 1.25 MG (50000 UT) CAPS     . diclofenac (VOLTAREN) 75 MG EC tablet Take 1 tablet (75 mg total) by mouth 2 (two) times daily. 60 tablet 0  . ezetimibe (ZETIA) 10 MG tablet     . fluticasone (FLONASE) 50 MCG/ACT nasal spray Place 2 sprays into both nostrils daily as needed for  allergies.     . hydrochlorothiazide (MICROZIDE) 12.5 MG capsule Take 12.5 mg by mouth daily as needed (vertigo).     Marland Kitchen lidocaine-prilocaine (EMLA) cream Apply to affected area once 30 g 3  . LORazepam (ATIVAN) 0.5 MG tablet     . meclizine (ANTIVERT) 25 MG tablet Take 25 mg by mouth 3 (three) times daily as needed (Vertigo).     . metoprolol tartrate (LOPRESSOR) 100 MG tablet     . omeprazole (PRILOSEC) 20 MG capsule Take 20 mg by mouth daily.     . pravastatin (PRAVACHOL) 10 MG tablet     . venlafaxine XR (EFFEXOR-XR) 75 MG 24 hr capsule Take 75 mg by mouth daily with breakfast.      Current Facility-Administered Medications  Medication Dose Route Frequency Provider Last Rate Last Dose  . betamethasone acetate-betamethasone sodium phosphate (CELESTONE) injection 3 mg  3 mg Intramuscular Once Edrick Kins, DPM        Review of Systems Review of Systems  All other systems reviewed and are negative.   Blood pressure 134/90, pulse 91, temperature 97.9 F (36.6 C), height '5\' 5"'$  (1.651 m), weight 223 lb (101.2 kg), SpO2 95 %.  Physical Exam Physical Exam Exam conducted with a chaperone present.  Constitutional:      General: She is not in acute distress.    Appearance: Normal appearance. She is obese.  HENT:     Head: Normocephalic and atraumatic.     Comments: Hair has grown back    Nose:     Comments: Covered with a mask secondary to COVID-19 precautions    Mouth/Throat:     Comments: Covered with a mask secondary to COVID-19 precautions Eyes:     General: No scleral icterus.       Right eye: No discharge.        Left eye: No discharge.     Conjunctiva/sclera: Conjunctivae normal.  Neck:     Musculoskeletal: Normal range of motion.     Comments: No thyromegaly or dominant thyroid masses palpated Cardiovascular:     Rate and Rhythm: Normal rate and regular rhythm.     Pulses: Normal pulses.     Heart sounds: No murmur.  Pulmonary:     Effort: Pulmonary effort is  normal.     Breath sounds: Normal breath sounds.  Chest:     Breasts: Breasts are asymmetrical.        Right: Skin change present.        Left: Normal.       Comments: Skin changes on the right breast consistent with radiation treatment.  Right breast is approximately 1/2 cup size smaller than the left breast.  The periareolar and axillary scars have healed nicely.  The breast tissue is dense and fibroglandular.  No  masses are palpated in either breast. Abdominal:     General: Bowel sounds are normal.     Palpations: Abdomen is soft.  Lymphadenopathy:     Cervical: No cervical adenopathy.     Upper Body:     Right upper body: No supraclavicular, axillary or pectoral adenopathy.     Left upper body: No supraclavicular, axillary or pectoral adenopathy.  Neurological:     Mental Status: She is alert.     Data Reviewed As above, I reviewed Dr. Gary Fleet note from August 25, wherein he stated plans to change her from letrozole to anastrozole.  I also reviewed the MUGA scan, the bone density scan and the diagnostic mammogram, as discussed above under the history of present illness.  The formal reports are copied here:  CLINICAL DATA:  Breast cancer. Evaluate cardiac function in relation to chemotherapy.  EXAM: NUCLEAR MEDICINE CARDIAC BLOOD POOL IMAGING (MUGA)  TECHNIQUE: Cardiac multi-gated acquisition was performed at rest following intravenous injection of Tc-13mlabeled red blood cells.  RADIOPHARMACEUTICALS:  24.7 mCi Tc-953mertechnetate in-vitro labeled red blood cells IV  COMPARISON:  12/06/2018  FINDINGS: No  focal wall motion abnormality of the left ventricle.  Calculated left ventricular ejection fraction equals 61 %. Comparable to 61% on 12/06/2018.  IMPRESSION: Left ventricular ejection fraction equals 61 %.  Name: Erin, Higuchiatient ID: [id number] Date: [birth year]eight:    65.5 in. Gender:     Female    [age]       Weight:     221.7 lbs. Ethnicity:  White                            Exam Date: 04/01/2019  FRAX* RESULTS:  (version: 3.5) 10-year Probability of Fracture1 Major Osteoporotic Fracture2 Hip Fracture 6.0% 0.2% Population: [place]) Risk Factors: None  Based on Femur (Left) Neck BMD  1 -The 10-year probability of fracture may be lower than reported if the patient has received treatment. 2 -Major Osteoporotic Fracture: Clinical Spine, Forearm, Hip or Shoulder  *FRAX is a trMaterials engineerf the UnState Street Corporationf ShWalt Disneyor Metabolic Bone Disease, a WoBenkelmanWHO) [facility] ASSESSMENT: The probability of a major osteoporotic fracture is 6.0% within the next ten years.  The probability of a hip fracture is 0.2% within the next ten years.  .  Technologist: SCE  PATIENT BIOGRAPHICAL: Name: Erin, Thorsonatient ID: [id number] Date: [birth year]eight: 64.5 in. Gender: Female Exam Date: 04/01/2019 Weight: 221.7 lbs. Indications: Breast CA, Caucasian, Early Menopause, High Risk Meds, History of Chemo, History of Radiation, Postmenopausal Fractures: Treatments: Femara, Flonase, Omeprazole, Vitamin D  ASSESSMENT:  The BMD measured at AP Spine L1-L4 is 0.998 g/cm2 with a T-score of -1.6. This patient is considered osteopenic according to WoMunfordWAl[facility] Eye [facility] LLCcriteria. The quality of the scan is good.  Site Region Measured Measured WHO Young Adult BMD Date       Age      Classification T-score AP Spine L1-L4 04/01/2019 [age] Osteopenia -1.6 0.998 g/cm2  DualFemur Neck Left 04/01/2019 58.0 Normal -0.7 0.937 g/cm2  World Health Organization (WSaint Joseph[facility] Hospital And Medical Centercriteria for post-menopausal, Caucasian Women: Normal:       T-score at or above -1 SD Osteopenia:   T-score between -1 and -2.5 SD Osteoporosis: T-score at or below -2.5 SD  CLINICAL DATA:  Routine six-month follow-up of the  right breast following  lumpectomy, radiation therapy and chemotherapy for right breast cancer in January 2020.  EXAM: DIGITAL DIAGNOSTIC UNILATERAL RIGHT MAMMOGRAM WITH CAD AND TOMO  COMPARISON:  Previous exam(s).  ACR Breast Density Category c: The breast tissue is heterogeneously dense, which may obscure small masses.  FINDINGS: Interval post lumpectomy and postradiation changes on the right with an approximately 7 cm postoperative seroma and small oval areas of fatty tissue in the lumpectomy bed in the medial aspect of the breast. The wire and clip seen on 09/11/2018 are surgically absent. No findings elsewhere in the breast suspicious for malignancy. A right-sided Port-A-Cath is noted.  Mammographic images were processed with CAD.  IMPRESSION: Benign post lumpectomy and postradiation changes on the right. No interval evidence of malignancy.  RECOMMENDATION: Bilateral diagnostic mammogram in 4 months. That will be 1 year since mammographic evaluation of the left breast.  I have discussed the findings and recommendations with the patient. Results were also provided in writing at the conclusion of the visit. If applicable, a reminder letter will be sent to the patient regarding the next appointment.  BI-RADS CATEGORY  2: Benign.  Assessment This is a 58 year old woman with stage Ia ER/PR positive, HER-2 positive right breast cancer, now status post lumpectomy and sentinel node biopsy, status post chemotherapy and radiation, now on [place].  Overall, she is doing well, with limited side effects from her treatment.  Plan We will schedule her for bilateral mammogram in December or January and have her see me thereafter for routine follow-up.    Fredirick Maudlin 05/06/2019, 11:59 AM

## 2019-05-22 ENCOUNTER — Inpatient Hospital Stay: Payer: BLUE CROSS/BLUE SHIELD

## 2019-05-22 ENCOUNTER — Other Ambulatory Visit: Payer: Self-pay | Admitting: Oncology

## 2019-05-22 ENCOUNTER — Telehealth: Payer: Self-pay | Admitting: *Deleted

## 2019-05-22 ENCOUNTER — Other Ambulatory Visit: Payer: Self-pay

## 2019-05-22 VITALS — BP 141/81 | HR 83 | Temp 98.2°F | Resp 20 | Wt 223.0 lb

## 2019-05-22 DIAGNOSIS — Z5112 Encounter for antineoplastic immunotherapy: Secondary | ICD-10-CM | POA: Diagnosis not present

## 2019-05-22 DIAGNOSIS — Z170 Estrogen receptor positive status [ER+]: Secondary | ICD-10-CM

## 2019-05-22 DIAGNOSIS — C50211 Malignant neoplasm of upper-inner quadrant of right female breast: Secondary | ICD-10-CM

## 2019-05-22 MED ORDER — SODIUM CHLORIDE 0.9 % IV SOLN
Freq: Once | INTRAVENOUS | Status: AC
  Administered 2019-05-22: 14:00:00 via INTRAVENOUS
  Filled 2019-05-22: qty 250

## 2019-05-22 MED ORDER — SODIUM CHLORIDE 0.9% FLUSH
10.0000 mL | INTRAVENOUS | Status: DC | PRN
  Administered 2019-05-22: 14:00:00 10 mL
  Filled 2019-05-22: qty 10

## 2019-05-22 MED ORDER — ANASTROZOLE 1 MG PO TABS: 1.0000 mg | ORAL_TABLET | Freq: Every day | ORAL | 3 refills | Status: DC

## 2019-05-22 MED ORDER — DIPHENHYDRAMINE HCL 25 MG PO CAPS
25.0000 mg | ORAL_CAPSULE | Freq: Once | ORAL | Status: AC
  Administered 2019-05-22: 14:00:00 25 mg via ORAL
  Filled 2019-05-22: qty 1

## 2019-05-22 MED ORDER — DEXAMETHASONE SODIUM PHOSPHATE 10 MG/ML IJ SOLN
10.0000 mg | Freq: Once | INTRAMUSCULAR | Status: AC
  Administered 2019-05-22: 10 mg via INTRAVENOUS
  Filled 2019-05-22: qty 1

## 2019-05-22 MED ORDER — HEPARIN SOD (PORK) LOCK FLUSH 100 UNIT/ML IV SOLN
500.0000 [IU] | Freq: Once | INTRAVENOUS | Status: AC | PRN
  Administered 2019-05-22: 500 [IU]
  Filled 2019-05-22: qty 5

## 2019-05-22 MED ORDER — ACETAMINOPHEN 325 MG PO TABS
650.0000 mg | ORAL_TABLET | Freq: Once | ORAL | Status: AC
  Administered 2019-05-22: 14:00:00 650 mg via ORAL
  Filled 2019-05-22: qty 2

## 2019-05-22 MED ORDER — TRASTUZUMAB-ANNS CHEMO 150 MG IV SOLR
600.0000 mg | Freq: Once | INTRAVENOUS | Status: AC
  Administered 2019-05-22: 600 mg via INTRAVENOUS
  Filled 2019-05-22: qty 28.57

## 2019-05-22 NOTE — Telephone Encounter (Signed)
Med called in

## 2019-05-22 NOTE — Telephone Encounter (Signed)
Patient called reporting that she was to have something to replace Letrozole as she could not take that 6 weeks ago and it has not been sent to pharmacy yet.Please advise, per office note she was to be switched to [facility]

## 2019-05-30 ENCOUNTER — Telehealth: Payer: Self-pay | Admitting: *Deleted

## 2019-05-30 ENCOUNTER — Telehealth: Payer: Self-pay | Admitting: General Surgery

## 2019-05-30 NOTE — Telephone Encounter (Signed)
Please call her surgeon to be evaluated. If she is unable to be seen, please let us know and we will see her in Symptom Management. Thanks!

## 2019-05-30 NOTE — Telephone Encounter (Signed)
States that she has noticed a knot in her right breast that has been a little soar. She would like to have this looked at. Patient to come in on Tuesday to be seen.

## 2019-05-30 NOTE — Telephone Encounter (Signed)
Call returned to patient, but had to leave message on voice mail to call her surgeon

## 2019-05-30 NOTE — Telephone Encounter (Signed)
Patient called reporting that she is having soreness and tenderness at her breast surgery site. She states she is 6 months out from treatment and recently had follow up with her surgeon, but is concerned about this tenderness she has developed. She asks what to do, could she be evaluated? Please advise

## 2019-05-30 NOTE — Telephone Encounter (Signed)
Patient said she saw Dr. Celine Ahr in the last six months patient said she felt a knot but wasn't sure if it had been there but said it may be scar tissue to the left of her nipple but said she a little sore, patient said he back is hurting a little bit but said that may just be something yet. please call patient and advise.

## 2019-06-03 ENCOUNTER — Encounter: Payer: Self-pay | Admitting: General Surgery

## 2019-06-03 ENCOUNTER — Other Ambulatory Visit: Payer: Self-pay

## 2019-06-03 ENCOUNTER — Ambulatory Visit: Payer: BLUE CROSS/BLUE SHIELD | Admitting: General Surgery

## 2019-06-03 VITALS — BP 121/84 | HR 92 | Temp 97.5°F | Ht 65.0 in | Wt 225.4 lb

## 2019-06-03 DIAGNOSIS — N644 Mastodynia: Secondary | ICD-10-CM | POA: Diagnosis not present

## 2019-06-03 NOTE — Patient Instructions (Addendum)
We will call you with the results from the mammogram and ultrasound.  Otherwise, will follow up in December.   Breast Tenderness Breast tenderness is a common problem for women of all ages. Breast tenderness may cause mild discomfort to severe pain. The pain usually comes and goes in association with your menstrual cycle, but it can be constant. Breast tenderness has many possible causes, including hormone changes and some medicines. Your health care provider may order tests, such as a mammogram or an ultrasound, to check for any unusual findings. Having breast tenderness usually does not mean that you have breast cancer. Follow these instructions at home: Sometimes, reassurance that you do not have breast cancer is all that is needed. In general, follow these home care instructions: Managing pain and discomfort   If directed, apply ice to the area: ? Put ice in a plastic bag. ? Place a towel between your skin and the bag. ? Leave the ice on for 20 minutes, 2-3 times a day.  Make sure you are wearing a supportive bra, especially during exercise. You may also want to wear a supportive bra while sleeping if your breasts are very tender. Medicines  Take over-the-counter and prescription medicines only as told by your health care provider. If the cause of your pain is infection, you may be prescribed an antibiotic medicine.  If you were prescribed an antibiotic, take it as told by your health care provider. Do not stop taking the antibiotic even if you start to feel better. General instructions   Your health care provider may recommend that you reduce the amount of fat in your diet. You can do this by: ? Limiting fried foods. ? Cooking foods using methods, such as baking, boiling, grilling, and broiling.  Decrease the amount of caffeine in your diet. You can do this by drinking more water and choosing caffeine-free options.  Keep a log of the days and times when your breasts are most  tender.  Ask your health care provider how to do breast exams at home. This will help you notice if you have an unusual growth or lump. Contact a health care provider if:  Any part of your breast is hard, red, and hot to the touch. This may be a sign of infection.  You are not breastfeeding and you have fluid, especially blood or pus, coming out of your nipples.  You have a fever.  You have a new or painful lump in your breast that remains after your menstrual period ends.  Your pain does not improve or it gets worse.  Your pain is interfering with your daily activities. This information is not intended to replace advice given to you by your health care provider. Make sure you discuss any questions you have with your health care provider. Document Released: 07/27/2008 Document Revised: 07/27/2017 Document Reviewed: 05/12/2016 Elsevier Patient Education  2020 [employer].

## 2019-06-03 NOTE — Progress Notes (Signed)
Patient ID: Erin Church, female   DOB: 1960-12-21, 58 y.o.   MRN: [id number]  Chief Complaint  Patient presents with  . Breast Pain    05/30/19 R breast soreness and hard     HPI Erin Church is a 57 y.o. female.   I saw her about a month ago.  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  She underwent a lumpectomy and sentinel node biopsy with Dr. Tama High, back in January of this year.  She has completed chemotherapy and radiation.  She receives Lobbyist (Building surveyor similar to herceptin) every 3 weeks.  She has not tolerated letrozole, complaining of significant fatigue and joint pain.  She saw Dr. Grayland Ormond last week and will be changing to anastrozole.  She had a diagnostic mammogram of the right breast on 825.  There were no concerning findings identified.  She continues to perform breast self exams and denies any new lumps, masses, or skin changes.  She denies any nipple discharge.  No issues with upper extremity lymphedema.  Recent cardiac MUGA showed preserved cardiac function.  A bone density study showed osteopenia in her lumbar spine, but no concern for osteoporosis.  Femoral neck bone density was within normal limits."  She contacted our office on Friday.  She says that she had been pulling weeds and noticed some muscle soreness in her axilla and pectoral muscles, but also some soreness in her right breast as well as firmness, and warmth.  She says that it seems to be getting better now, but she was concerned and wanted to be evaluated.  She denies any fevers or chills.  No nipple drainage.  She feels like she is doing well on anastrozole, having not tolerated letrozole.   Past Medical History:  Diagnosis Date  . Anginal pain ([place])   . Arthritis   . Breast cancer ([place])   . Carpal tunnel syndrome of  right wrist   . Coronary atherosclerosis of unspecified type of vessel, native or graft   . Hand and foot pain   . Hyperlipidemia   . Hypertension   . MVP (mitral valve prolapse)   . Personal history of chemotherapy   . Personal history of radiation therapy   . Sleep apnea   . Vertigo     Past Surgical History:  Procedure Laterality Date  . BREAST BIOPSY Right 08/27/2018   [place] bx, [facility]  . BREAST LUMPECTOMY Right 09/11/2018  . BREAST LUMPECTOMY WITH NEEDLE LOCALIZATION AND AXILLARY SENTINEL LYMPH NODE BX Right 09/11/2018   Procedure: BREAST LUMPECTOMY WITH NEEDLE LOCALIZATION AND SENTINEL LYMPH NODE BX;  Surgeon: [name], MD;  Location: [facility];  Service: General;  Laterality: Right;  . CARDIAC CATHETERIZATION    . CATARACT EXTRACTION W/PHACO Left 01/01/2017   Procedure: CATARACT EXTRACTION PHACO AND INTRAOCULAR LENS PLACEMENT (IOC)left Restor lens;  Surgeon: Leandrew Koyanagi, MD;  Location: [place];  Service: Ophthalmology;  Laterality: Left;  Restor lens  . CATARACT EXTRACTION W/PHACO Right 01/31/2017   Procedure: CATARACT EXTRACTION PHACO AND INTRAOCULAR LENS PLACEMENT (IOC);  Surgeon: Leandrew Koyanagi, MD;  Location: [place];  Service: Ophthalmology;  Laterality: Right;  . CESAREAN SECTION    . CHOLECYSTECTOMY    . HYSTEROSCOPY  06/2010   D&C  . PORTACATH PLACEMENT Right 09/11/2018   Procedure: INSERTION  PORT-A-CATH;  Surgeon: [name], MD;  Location: [facility];  Service: General;  Laterality: Right;  . TUBAL LIGATION      Family History  Problem Relation Age of Onset  . Hypertension Mother   . Cancer Mother 50       LUNG  . Hypertension Father   . Breast cancer Neg Hx     Social History Social History   Tobacco Use  . Smoking status: Never Smoker  . Smokeless tobacco: Never Used  Substance Use Topics  . Alcohol use: Yes    Alcohol/week: 3.0 standard drinks    Types: 3 Shots of liquor per week    Comment: social drinker   . Drug use: No    Allergies  Allergen Reactions  . Isosorbide Nitrate Other (See Comments)    headache    Current Outpatient Medications  Medication Sig Dispense Refill  . amLODipine-benazepril (LOTREL) 10-20 MG per capsule Take 1 capsule by mouth daily.     Marland Kitchen anastrozole (ARIMIDEX) 1 MG tablet Take 1 tablet (1 mg total) by mouth daily. 90 tablet 3  . Cholecalciferol (VITAMIN D3) 1.25 MG (50000 UT) CAPS     . diclofenac (VOLTAREN) 75 MG EC tablet Take 1 tablet (75 mg total) by mouth 2 (two) times daily. 60 tablet 0  . ezetimibe (ZETIA) 10 MG tablet     . fluticasone (FLONASE) 50 MCG/ACT nasal spray Place 2 sprays into both nostrils daily as needed for allergies.     . hydrochlorothiazide (MICROZIDE) 12.5 MG capsule Take 12.5 mg by mouth daily as needed (vertigo).     Marland Kitchen lidocaine-prilocaine (EMLA) cream Apply to affected area once 30 g 3  . LORazepam (ATIVAN) 0.5 MG tablet     . meclizine (ANTIVERT) 25 MG tablet Take 25 mg by mouth 3 (three) times daily as needed (Vertigo).     . metoprolol tartrate (LOPRESSOR) 100 MG tablet     . omeprazole (PRILOSEC) 20 MG capsule Take 20 mg by mouth daily.     . pravastatin (PRAVACHOL) 10 MG tablet     . venlafaxine XR (EFFEXOR-XR) 75 MG 24 hr capsule Take 75 mg by mouth daily with breakfast.      Current Facility-Administered Medications  Medication Dose Route Frequency Provider Last Rate Last Dose  . betamethasone acetate-betamethasone sodium phosphate (CELESTONE) injection 3 mg  3 mg Intramuscular Once Edrick Kins, DPM        Review of Systems Review of Systems  All other systems reviewed and are negative.   Blood pressure 121/84, pulse 92, temperature (!) 97.5 F (36.4 C), temperature source Temporal, height '5\' 5"'$  (1.651 m), weight 225 lb 6.4 oz (102.2 kg), SpO2 95 %. Body mass index is 37.51 kg/m.  Physical Exam  I performed a focused breast exam.  Physical Exam Chest:     Breasts:        Right: Skin change present.         Left: Normal.       Comments: Skin changes are present consistent with prior radiation therapy.  The surgical incisions are well-healed.  There is slight tethering at the periareolar incision.  There is no nipple discharge.  At the upper inner quadrant of the right breast, there is fullness with slight increased redness.  The area feels warm to the touch. Lymphadenopathy:     Upper Body:     Right upper body: No supraclavicular, axillary or pectoral adenopathy.     Data Reviewed The  patient's recent unilateral mammogram was reviewed.  The results are copied here: CLINICAL DATA:  Routine six-month follow-up of the right breast following lumpectomy, radiation therapy and chemotherapy for right breast cancer in January 2020.  EXAM: DIGITAL DIAGNOSTIC UNILATERAL RIGHT MAMMOGRAM WITH CAD AND TOMO  COMPARISON:  Previous exam(s).  ACR Breast Density Category c: The breast tissue is heterogeneously dense, which may obscure small masses.  FINDINGS: Interval post lumpectomy and postradiation changes on the right with an approximately 7 cm postoperative seroma and small oval areas of fatty tissue in the lumpectomy bed in the medial aspect of the breast. The wire and clip seen on 09/11/2018 are surgically absent. No findings elsewhere in the breast suspicious for malignancy. A right-sided Port-A-Cath is noted.  Mammographic images were processed with CAD.  IMPRESSION: Benign post lumpectomy and postradiation changes on the right. No interval evidence of malignancy.  RECOMMENDATION: Bilateral diagnostic mammogram in 4 months. That will be 1 year since mammographic evaluation of the left breast.  I have discussed the findings and recommendations with the patient. Results were also provided in writing at the conclusion of the visit. If applicable, a reminder letter will be sent to the patient regarding the next appointment.  BI-RADS CATEGORY  2: Benign.   Assessment This  is a 58 year old woman with a history of breast cancer status post lumpectomy, sentinel lymph node biopsy, chemotherapy, and radiation.  She has been doing well and her mammogram did not show any sign of recurrent or persistent disease.  It did show a 7 cm seroma.  I am concerned that this may have become infected or that she has some overlying cellulitis.  Plan We will obtain a dedicated right breast ultrasound to determine if there is any abscess or drainable fluid collection present.  She may need antibiotics.  I will await the results and proceed with appropriate intervention at that time.    Fredirick Maudlin 06/03/2019, 2:51 PM

## 2019-06-09 NOTE — Progress Notes (Signed)
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  INTERVAL HISTORY: Patient returns to clinic today for further evaluation and continuation of maintenance Kanjinti.  She currently feels well and is asymptomatic.  Since discontinuing letrozole and initiating anastrozole her hot flashes and joint pain have resolved. She has no neurologic complaints.  She denies any recent fevers or illnesses.  She has a good appetite and denies weight loss.  She denies any chest pain, shortness of breath, cough, or hemoptysis.  She denies any nausea, vomiting, constipation, or diarrhea.  She has no urinary complaints.  Patient offers no specific complaints today.  REVIEW OF SYSTEMS:   Review of Systems  Constitutional: Negative.  Negative for fever, malaise/fatigue and weight loss.  Respiratory: Negative.  Negative for cough, hemoptysis and shortness of breath.   Cardiovascular: Negative.  Negative for chest pain and leg swelling.  Gastrointestinal: Negative.  Negative for abdominal pain, blood in stool and melena.  Genitourinary: Negative.  Negative for dysuria.  Musculoskeletal: Negative.  Negative for back pain.  Skin: Negative.  Negative for rash.  Neurological: Negative.  Negative for seizures, weakness and headaches.  Psychiatric/Behavioral: Negative.  The patient is not nervous/anxious.     As per HPI. Otherwise, a complete review of systems is negative.  PAST MEDICAL HISTORY: Past Medical History:  Diagnosis Date   Anginal pain ([place])    Arthritis    Breast cancer ([place])    Carpal tunnel syndrome of right wrist    Coronary  atherosclerosis of unspecified type of vessel, native or graft    Hand and foot pain    Hyperlipidemia    Hypertension    MVP (mitral valve prolapse)    Personal history of chemotherapy    Personal history of radiation therapy    Sleep apnea    Vertigo     PAST SURGICAL HISTORY: Past Surgical History:  Procedure Laterality Date   BREAST BIOPSY Right 08/27/2018   [place] bx, [facility]   BREAST LUMPECTOMY Right 09/11/2018   BREAST LUMPECTOMY WITH NEEDLE LOCALIZATION AND AXILLARY SENTINEL LYMPH NODE BX Right 09/11/2018   Procedure: BREAST LUMPECTOMY WITH NEEDLE LOCALIZATION AND SENTINEL LYMPH NODE BX;  Surgeon: [name], MD;  Location: [facility];  Service: General;  Laterality: Right;   CARDIAC CATHETERIZATION     CATARACT EXTRACTION W/PHACO Left 01/01/2017   Procedure: CATARACT EXTRACTION PHACO AND INTRAOCULAR LENS PLACEMENT (IOC)left Restor lens;  Surgeon: Leandrew Koyanagi, MD;  Location: [place];  Service: Ophthalmology;  Laterality: Left;  Restor lens   CATARACT EXTRACTION W/PHACO Right 01/31/2017   Procedure: CATARACT EXTRACTION PHACO AND INTRAOCULAR LENS PLACEMENT (IOC);  Surgeon: Leandrew Koyanagi, MD;  Location: [place];  Service: Ophthalmology;  Laterality: Right;   CESAREAN SECTION     CHOLECYSTECTOMY     HYSTEROSCOPY  06/2010   D&C   PORTACATH PLACEMENT Right 09/11/2018   Procedure: INSERTION PORT-A-CATH;  Surgeon: [name], MD;  Location: [facility];  Service: General;  Laterality: Right;   TUBAL LIGATION      FAMILY HISTORY: Family History  Problem Relation Age of Onset   Hypertension Mother    Cancer Mother 4  LUNG   Hypertension Father    Breast cancer Neg Hx     ADVANCED DIRECTIVES (Y/N):  N  HEALTH MAINTENANCE: Social History   Tobacco Use   Smoking status: Never Smoker   Smokeless tobacco: Never Used  Substance Use Topics   Alcohol use: Yes    Alcohol/week: 3.0 standard drinks    Types: 3  Shots of liquor per week    Comment: social drinker   Drug use: No     Colonoscopy:  PAP:  Bone density:  Lipid panel:  Allergies  Allergen Reactions   Isosorbide Nitrate Other (See Comments)    headache    Current Outpatient Medications  Medication Sig Dispense Refill   amLODipine-benazepril (LOTREL) 10-20 MG per capsule Take 1 capsule by mouth daily.      anastrozole (ARIMIDEX) 1 MG tablet Take 1 tablet (1 mg total) by mouth daily. 90 tablet 3   Cholecalciferol (VITAMIN D3) 1.25 MG (50000 UT) CAPS      diclofenac (VOLTAREN) 75 MG EC tablet Take 1 tablet (75 mg total) by mouth 2 (two) times daily. 60 tablet 0   ezetimibe (ZETIA) 10 MG tablet      fluticasone (FLONASE) 50 MCG/ACT nasal spray Place 2 sprays into both nostrils daily as needed for allergies.      hydrochlorothiazide (MICROZIDE) 12.5 MG capsule Take 12.5 mg by mouth daily as needed (vertigo).      lidocaine-prilocaine (EMLA) cream Apply to affected area once 30 g 3   LORazepam (ATIVAN) 0.5 MG tablet      meclizine (ANTIVERT) 25 MG tablet Take 25 mg by mouth 3 (three) times daily as needed (Vertigo).      metoprolol tartrate (LOPRESSOR) 100 MG tablet      omeprazole (PRILOSEC) 20 MG capsule Take 20 mg by mouth daily.      pravastatin (PRAVACHOL) 10 MG tablet      venlafaxine XR (EFFEXOR-XR) 75 MG 24 hr capsule Take 75 mg by mouth daily with breakfast.      Current Facility-Administered Medications  Medication Dose Route Frequency Provider Last Rate Last Dose   betamethasone acetate-betamethasone sodium phosphate (CELESTONE) injection 3 mg  3 mg Intramuscular Once Edrick Kins, DPM        OBJECTIVE: Vitals:   06/12/19 1004  BP: (!) 145/96  Pulse: 79  Resp: 18  Temp: 97.8 F (36.6 C)     Body mass index is 37.16 kg/m.    ECOG FS:0 - Asymptomatic  General: Well-developed, well-nourished, no acute distress. Eyes: Pink conjunctiva, anicteric sclera. HEENT: Normocephalic, moist mucous  membranes. Lungs: Clear to auscultation bilaterally. Heart: Regular rate and rhythm. No rubs, murmurs, or gallops. Abdomen: Soft, nontender, nondistended. No organomegaly noted, normoactive bowel sounds. Musculoskeletal: No edema, cyanosis, or clubbing. Neuro: Alert, answering all questions appropriately. Cranial nerves grossly intact. Skin: No rashes or petechiae noted. Psych: Normal affect.  LAB RESULTS:  Lab Results  Component Value Date   NA 135 06/12/2019   K 4.0 06/12/2019   CL 105 06/12/2019   CO2 23 06/12/2019   GLUCOSE 96 06/12/2019   BUN 16 06/12/2019   CREATININE 0.68 06/12/2019   CALCIUM 9.3 06/12/2019   PROT 7.7 06/12/2019   ALBUMIN 3.5 06/12/2019   AST 22 06/12/2019   ALT 20 06/12/2019   ALKPHOS 97 06/12/2019   BILITOT 0.4 06/12/2019   GFRNONAA >60 06/12/2019   GFRAA >60 06/12/2019    Lab Results  Component Value Date   WBC 7.2 06/12/2019  NEUTROABS 4.1 06/12/2019   HGB 13.4 06/12/2019   HCT 40.1 06/12/2019   MCV 89.7 06/12/2019   PLT 385 06/12/2019     STUDIES: US Breast Ltd Uni Right Inc Axilla  Result Date: 06/10/2019 CLINICAL DATA:  History of right breast invasive mammary carcinoma grade 3 status post breast conserving therapy in January 2020. Patient is here for evaluation of a palpable area of concern in the right breast with overlying erythema. The patient denies fevers or chills. EXAM: DIGITAL DIAGNOSTIC BILATERAL MAMMOGRAM WITH CAD AND TOMO ULTRASOUND RIGHT BREAST COMPARISON:  Previous exam(s). ACR Breast Density Category c: The breast tissue is heterogeneously dense, which may obscure small masses. FINDINGS: Postoperative changes are seen in the right breast. A circumscribed mass in the upper inner right breast underlying a palpable skin marker measures 5 cm and is most consistent with a postoperative seroma. Multiple areas of fat necrosis are seen along the edges of the seroma measuring up to 1 cm in diameter. A right-sided port catheter is  partially imaged in the upper right chest. No suspicious mass, microcalcification, or other finding is identified in the left breast. Mammographic images were processed with CAD. Targeted right breast ultrasound was performed by the sonographer and the physician. There is hyperpigmentation of the right breast, consistent with post radiation treatment changes. There is no significant induration or tenderness in the palpable area of concern. At 12:30 o'clock 2 cm from the nipple underlying the palpable area of concern a benign postoperative seroma measures 5.5 x 4.9 x 2.9 cm. This corresponds to the circumscribed mass seen on mammogram. Adjacent hypoechoic masses measure up to 0.9 cm and correspond to the areas of fat necrosis on the mammogram. There is no significant associated edema or hyperemia to suggest superimposed infection. No evidence of malignancy. IMPRESSION: Postoperative seroma in the right breast corresponding to the palpable area of concern. No evidence of malignancy in either breast. RECOMMENDATION: Recommend routine annual diagnostic mammogram in 1 year. I have discussed the findings and recommendations with the patient. If applicable, a reminder letter will be sent to the patient regarding the next appointment. BI-RADS CATEGORY  2: Benign. Electronically Signed   By: Zerita Boers M.D.   On: 06/10/2019 16:30   Mm Diag Breast Tomo Bilateral  Result Date: 06/10/2019 CLINICAL DATA:  History of right breast invasive mammary carcinoma grade 3 status post breast conserving therapy in January 2020. Patient is here for evaluation of a palpable area of concern in the right breast with overlying erythema. The patient denies fevers or chills. EXAM: DIGITAL DIAGNOSTIC BILATERAL MAMMOGRAM WITH CAD AND TOMO ULTRASOUND RIGHT BREAST COMPARISON:  Previous exam(s). ACR Breast Density Category c: The breast tissue is heterogeneously dense, which may obscure small masses. FINDINGS: Postoperative changes are seen in  the right breast. A circumscribed mass in the upper inner right breast underlying a palpable skin marker measures 5 cm and is most consistent with a postoperative seroma. Multiple areas of fat necrosis are seen along the edges of the seroma measuring up to 1 cm in diameter. A right-sided port catheter is partially imaged in the upper right chest. No suspicious mass, microcalcification, or other finding is identified in the left breast. Mammographic images were processed with CAD. Targeted right breast ultrasound was performed by the sonographer and the physician. There is hyperpigmentation of the right breast, consistent with post radiation treatment changes. There is no significant induration or tenderness in the palpable area of concern. At 12:30 o'clock 2 cm from the  nipple underlying the palpable area of concern a benign postoperative seroma measures 5.5 x 4.9 x 2.9 cm. This corresponds to the circumscribed mass seen on mammogram. Adjacent hypoechoic masses measure up to 0.9 cm and correspond to the areas of fat necrosis on the mammogram. There is no significant associated edema or hyperemia to suggest superimposed infection. No evidence of malignancy. IMPRESSION: Postoperative seroma in the right breast corresponding to the palpable area of concern. No evidence of malignancy in either breast. RECOMMENDATION: Recommend routine annual diagnostic mammogram in 1 year. I have discussed the findings and recommendations with the patient. If applicable, a reminder letter will be sent to the patient regarding the next appointment. BI-RADS CATEGORY  2: Benign. Electronically Signed   By: Zerita Boers M.D.   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  She also had port placement at the same time.  She completed 12 weekly cycles of Herceptin plus Taxol on December 26, 2018.  She now will require maintenance Kanjinti (bio similar to Herceptin) every 3 weeks for a total of 12 months.  She has now completed XRT.  Patient has significant side effects of letrozole, therefore has been switched to anastrozole.  She will be required to take a total of 5 years of treatment completing in July 2025.  Patient's most recent MUGA scan on March 28, 2019 was unchanged with an EF of 61%.  Repeat MUGA prior to next treatment.  Mammogram on April 22, 2019 was reported as BI-RADS 2, repeat in August 2021.  Proceed with cycle 12 of maintenance Kanjinti today.  Return to clinic in 3 weeks for treatment only and then in 6 weeks for further evaluation and consideration of cycle 14. 2.  Osteopenia: Patient had a baseline bone mineral density on April 01, 2019 that revealed a T score of -1.6.  Have recommended calcium and vitamin D supplementation.  Repeat in August 2021. 3.  Hot flashes/joint pain: Resolved since discontinuing letrozole.  Anastrozole as above.  I spent a total of 30 minutes face-to-face with the patient of which greater than 50% of the visit was spent in counseling and coordination of care as detailed above.   Patient expressed understanding and was in agreement with this plan. She also understands that She can call clinic at any time with any questions, concerns, or complaints.   Cancer Staging Malignant neoplasm of upper-inner quadrant of right breast in female, estrogen receptor positive ([place]) Staging form: Breast, AJCC 8th Edition - Clinical stage from 09/06/2018: Stage IA (cT1c, cN0, cM0, G3, ER+, PR+, HER2+) - Signed by Lloyd Huger, MD on 09/06/2018   Lloyd Huger, MD   06/13/2019 1:52 PM

## 2019-06-10 ENCOUNTER — Ambulatory Visit
Admission: RE | Admit: 2019-06-10 | Discharge: 2019-06-10 | Disposition: A | Discharge status: Home / Self Care | Payer: BLUE CROSS/BLUE SHIELD | Source: Ambulatory Visit | Attending: General Surgery | Admitting: General Surgery

## 2019-06-10 DIAGNOSIS — N644 Mastodynia: Secondary | ICD-10-CM

## 2019-06-11 ENCOUNTER — Encounter: Payer: Self-pay | Admitting: Oncology

## 2019-06-11 ENCOUNTER — Other Ambulatory Visit: Payer: Self-pay

## 2019-06-11 NOTE — Progress Notes (Signed)
Pre-visit assessment completed prior to [place] appointment with Dr. Grayland Ormond on 06/12/2019. No concerns identified. Pt had post-surgical mammogram and [place] yesterday for concerning changes in her right breast. No evidence of malignancy on either per pt report.

## 2019-06-12 ENCOUNTER — Telehealth: Payer: Self-pay | Admitting: General Surgery

## 2019-06-12 ENCOUNTER — Inpatient Hospital Stay: Payer: BLUE CROSS/BLUE SHIELD

## 2019-06-12 ENCOUNTER — Other Ambulatory Visit: Payer: Self-pay

## 2019-06-12 ENCOUNTER — Inpatient Hospital Stay (HOSPITAL_BASED_OUTPATIENT_CLINIC_OR_DEPARTMENT_OTHER): Payer: BLUE CROSS/BLUE SHIELD | Admitting: Oncology

## 2019-06-12 ENCOUNTER — Inpatient Hospital Stay: Payer: BLUE CROSS/BLUE SHIELD | Attending: Oncology

## 2019-06-12 ENCOUNTER — Encounter: Payer: Self-pay | Admitting: Oncology

## 2019-06-12 VITALS — BP 145/96 | HR 79 | Temp 97.8°F | Resp 18 | Wt 223.3 lb

## 2019-06-12 DIAGNOSIS — Z5112 Encounter for antineoplastic immunotherapy: Secondary | ICD-10-CM | POA: Insufficient documentation

## 2019-06-12 DIAGNOSIS — C50211 Malignant neoplasm of upper-inner quadrant of right female breast: Secondary | ICD-10-CM | POA: Insufficient documentation

## 2019-06-12 DIAGNOSIS — Z170 Estrogen receptor positive status [ER+]: Secondary | ICD-10-CM

## 2019-06-12 DIAGNOSIS — M8580 Other specified disorders of bone density and structure, unspecified site: Secondary | ICD-10-CM | POA: Insufficient documentation

## 2019-06-12 DIAGNOSIS — Z95828 Presence of other vascular implants and grafts: Secondary | ICD-10-CM

## 2019-06-12 LAB — CBC WITH DIFFERENTIAL/PLATELET
Abs Immature Granulocytes: 0.03 10*3/uL (ref 0.00–0.07)
Basophils Absolute: 0.1 10*3/uL (ref 0.0–0.1)
Basophils Relative: 1 %
Eosinophils Absolute: 0.1 10*3/uL (ref 0.0–0.5)
Eosinophils Relative: 2 %
HCT: 40.1 % (ref 36.0–46.0)
Hemoglobin: 13.4 g/dL (ref 12.0–15.0)
Immature Granulocytes: 0 %
Lymphocytes Relative: 28 %
Lymphs Abs: 2 10*3/uL (ref 0.7–4.0)
MCH: 30 pg (ref 26.0–34.0)
MCHC: 33.4 g/dL (ref 30.0–36.0)
MCV: 89.7 fL (ref 80.0–100.0)
Monocytes Absolute: 0.8 10*3/uL (ref 0.1–1.0)
Monocytes Relative: 12 %
Neutro Abs: 4.1 10*3/uL (ref 1.7–7.7)
Neutrophils Relative %: 57 %
Platelets: 385 10*3/uL (ref 150–400)
RBC: 4.47 MIL/uL (ref 3.87–5.11)
RDW: 13.2 % (ref 11.5–15.5)
WBC: 7.2 10*3/uL (ref 4.0–10.5)
nRBC: 0 % (ref 0.0–0.2)

## 2019-06-12 LAB — COMPREHENSIVE METABOLIC PANEL
ALT: 20 U/L (ref 0–44)
AST: 22 U/L (ref 15–41)
Albumin: 3.5 g/dL (ref 3.5–5.0)
Alkaline Phosphatase: 97 U/L (ref 38–126)
Anion gap: 7 (ref 5–15)
BUN: 16 mg/dL (ref 6–20)
CO2: 23 mmol/L (ref 22–32)
Calcium: 9.3 mg/dL (ref 8.9–10.3)
Chloride: 105 mmol/L (ref 98–111)
Creatinine, Ser: 0.68 mg/dL (ref 0.44–1.00)
GFR calc Af Amer: >60 mL/min (ref >60)
GFR calc non Af Amer: >60 mL/min (ref >60)
Glucose, Bld: 96 mg/dL (ref 70–99)
Potassium: 4 mmol/L (ref 3.5–5.1)
Sodium: 135 mmol/L (ref 135–145)
Total Bilirubin: 0.4 mg/dL (ref 0.3–1.2)
Total Protein: 7.7 g/dL (ref 6.5–8.1)

## 2019-06-12 MED ORDER — SODIUM CHLORIDE 0.9% FLUSH
10.0000 mL | Freq: Once | INTRAVENOUS | Status: AC
  Administered 2019-06-12: 10 mL via INTRAVENOUS
  Filled 2019-06-12: qty 10

## 2019-06-12 MED ORDER — DEXAMETHASONE SODIUM PHOSPHATE 10 MG/ML IJ SOLN
10.0000 mg | Freq: Once | INTRAMUSCULAR | Status: AC
  Administered 2019-06-12: 10 mg via INTRAVENOUS
  Filled 2019-06-12: qty 1

## 2019-06-12 MED ORDER — TRASTUZUMAB-ANNS CHEMO 150 MG IV SOLR
600.0000 mg | Freq: Once | INTRAVENOUS | Status: AC
  Administered 2019-06-12: 11:00:00 600 mg via INTRAVENOUS
  Filled 2019-06-12: qty 28.57

## 2019-06-12 MED ORDER — DIPHENHYDRAMINE HCL 25 MG PO CAPS
25.0000 mg | ORAL_CAPSULE | Freq: Once | ORAL | Status: AC
  Administered 2019-06-12: 11:00:00 25 mg via ORAL
  Filled 2019-06-12: qty 1

## 2019-06-12 MED ORDER — SODIUM CHLORIDE 0.9 % IV SOLN
Freq: Once | INTRAVENOUS | Status: AC
  Administered 2019-06-12: 11:00:00 via INTRAVENOUS
  Filled 2019-06-12: qty 250

## 2019-06-12 MED ORDER — HEPARIN SOD (PORK) LOCK FLUSH 100 UNIT/ML IV SOLN
500.0000 [IU] | Freq: Once | INTRAVENOUS | Status: AC | PRN
  Administered 2019-06-12: 12:00:00 500 [IU]
  Filled 2019-06-12: qty 5

## 2019-06-12 MED ORDER — ACETAMINOPHEN 325 MG PO TABS
650.0000 mg | ORAL_TABLET | Freq: Once | ORAL | Status: AC
  Administered 2019-06-12: 11:00:00 650 mg via ORAL
  Filled 2019-06-12: qty 2

## 2019-06-12 NOTE — Telephone Encounter (Signed)
Called patient to discuss the results of her recent breast imaging.  Briefly, the area of concern shows persistence of the postoperative seroma that was known to exist.  It is smaller than on previous studies.  There is no concern for infection.  The patient states that the area is still a bit tender but is improving.  She will contact me if there are any additional worrisome changes.

## 2019-06-12 NOTE — Progress Notes (Signed)
Pt in for follow up, reports had mammogram recently.

## 2019-06-23 ENCOUNTER — Encounter: Admission: RE | Admit: 2019-06-23 | Payer: BLUE CROSS/BLUE SHIELD | Source: Ambulatory Visit

## 2019-07-01 ENCOUNTER — Encounter
Admission: RE | Admit: 2019-07-01 | Discharge: 2019-07-01 | Disposition: A | Discharge status: Home / Self Care | Payer: BLUE CROSS/BLUE SHIELD | Source: Ambulatory Visit | Attending: Oncology | Admitting: Oncology

## 2019-07-01 ENCOUNTER — Other Ambulatory Visit: Payer: Self-pay

## 2019-07-01 DIAGNOSIS — C50211 Malignant neoplasm of upper-inner quadrant of right female breast: Secondary | ICD-10-CM | POA: Diagnosis not present

## 2019-07-01 DIAGNOSIS — Z170 Estrogen receptor positive status [ER+]: Secondary | ICD-10-CM | POA: Diagnosis not present

## 2019-07-01 MED ORDER — TECHNETIUM TC 99M-LABELED RED BLOOD CELLS IV KIT
23.3000 | PACK | Freq: Once | INTRAVENOUS | Status: AC | PRN
  Administered 2019-07-01: 23.3 via INTRAVENOUS

## 2019-07-03 ENCOUNTER — Inpatient Hospital Stay: Payer: BLUE CROSS/BLUE SHIELD | Attending: Oncology

## 2019-07-03 ENCOUNTER — Other Ambulatory Visit: Payer: Self-pay

## 2019-07-03 ENCOUNTER — Inpatient Hospital Stay: Payer: BLUE CROSS/BLUE SHIELD

## 2019-07-03 VITALS — BP 126/81 | HR 82 | Temp 98.1°F | Resp 18

## 2019-07-03 DIAGNOSIS — Z95828 Presence of other vascular implants and grafts: Secondary | ICD-10-CM

## 2019-07-03 DIAGNOSIS — Z5112 Encounter for antineoplastic immunotherapy: Secondary | ICD-10-CM | POA: Diagnosis present

## 2019-07-03 DIAGNOSIS — C50211 Malignant neoplasm of upper-inner quadrant of right female breast: Secondary | ICD-10-CM | POA: Insufficient documentation

## 2019-07-03 DIAGNOSIS — Z170 Estrogen receptor positive status [ER+]: Secondary | ICD-10-CM

## 2019-07-03 LAB — COMPREHENSIVE METABOLIC PANEL
ALT: 20 U/L (ref 0–44)
AST: 23 U/L (ref 15–41)
Albumin: 3.5 g/dL (ref 3.5–5.0)
Alkaline Phosphatase: 105 U/L (ref 38–126)
Anion gap: 8 (ref 5–15)
BUN: 14 mg/dL (ref 6–20)
CO2: 23 mmol/L (ref 22–32)
Calcium: 9.1 mg/dL (ref 8.9–10.3)
Chloride: 103 mmol/L (ref 98–111)
Creatinine, Ser: 0.73 mg/dL (ref 0.44–1.00)
GFR calc Af Amer: >60 mL/min (ref >60)
GFR calc non Af Amer: >60 mL/min (ref >60)
Glucose, Bld: 91 mg/dL (ref 70–99)
Potassium: 3.8 mmol/L (ref 3.5–5.1)
Sodium: 134 mmol/L — ABNORMAL LOW (ref 135–145)
Total Bilirubin: 0.4 mg/dL (ref 0.3–1.2)
Total Protein: 7.8 g/dL (ref 6.5–8.1)

## 2019-07-03 LAB — CBC WITH DIFFERENTIAL/PLATELET
Abs Immature Granulocytes: 0.02 10*3/uL (ref 0.00–0.07)
Basophils Absolute: 0.1 10*3/uL (ref 0.0–0.1)
Basophils Relative: 1 %
Eosinophils Absolute: 0.1 10*3/uL (ref 0.0–0.5)
Eosinophils Relative: 1 %
HCT: 40.5 % (ref 36.0–46.0)
Hemoglobin: 13.3 g/dL (ref 12.0–15.0)
Immature Granulocytes: 0 %
Lymphocytes Relative: 28 %
Lymphs Abs: 2.2 10*3/uL (ref 0.7–4.0)
MCH: 29.9 pg (ref 26.0–34.0)
MCHC: 32.8 g/dL (ref 30.0–36.0)
MCV: 91 fL (ref 80.0–100.0)
Monocytes Absolute: 1 10*3/uL (ref 0.1–1.0)
Monocytes Relative: 12 %
Neutro Abs: 4.6 10*3/uL (ref 1.7–7.7)
Neutrophils Relative %: 58 %
Platelets: 391 10*3/uL (ref 150–400)
RBC: 4.45 MIL/uL (ref 3.87–5.11)
RDW: 13.2 % (ref 11.5–15.5)
WBC: 8.1 10*3/uL (ref 4.0–10.5)
nRBC: 0 % (ref 0.0–0.2)

## 2019-07-03 MED ORDER — SODIUM CHLORIDE 0.9 % IV SOLN
Freq: Once | INTRAVENOUS | Status: AC
  Administered 2019-07-03: 14:00:00 via INTRAVENOUS
  Filled 2019-07-03: qty 250

## 2019-07-03 MED ORDER — HEPARIN SOD (PORK) LOCK FLUSH 100 UNIT/ML IV SOLN
500.0000 [IU] | Freq: Once | INTRAVENOUS | Status: AC | PRN
  Administered 2019-07-03: 500 [IU]
  Filled 2019-07-03: qty 5

## 2019-07-03 MED ORDER — DIPHENHYDRAMINE HCL 25 MG PO CAPS
25.0000 mg | ORAL_CAPSULE | Freq: Once | ORAL | Status: AC
  Administered 2019-07-03: 14:00:00 25 mg via ORAL
  Filled 2019-07-03: qty 1

## 2019-07-03 MED ORDER — DEXAMETHASONE SODIUM PHOSPHATE 10 MG/ML IJ SOLN
10.0000 mg | Freq: Once | INTRAMUSCULAR | Status: AC
  Administered 2019-07-03: 10 mg via INTRAVENOUS
  Filled 2019-07-03: qty 1

## 2019-07-03 MED ORDER — ACETAMINOPHEN 325 MG PO TABS
650.0000 mg | ORAL_TABLET | Freq: Once | ORAL | Status: AC
  Administered 2019-07-03: 650 mg via ORAL
  Filled 2019-07-03: qty 2

## 2019-07-03 MED ORDER — SODIUM CHLORIDE 0.9% FLUSH
10.0000 mL | Freq: Once | INTRAVENOUS | Status: AC
  Administered 2019-07-03: 13:00:00 10 mL via INTRAVENOUS
  Filled 2019-07-03: qty 10

## 2019-07-03 MED ORDER — TRASTUZUMAB-ANNS CHEMO 150 MG IV SOLR
600.0000 mg | Freq: Once | INTRAVENOUS | Status: AC
  Administered 2019-07-03: 600 mg via INTRAVENOUS
  Filled 2019-07-03: qty 28.57

## 2019-07-23 NOTE — Progress Notes (Deleted)
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  INTERVAL HISTORY: Patient returns to clinic today for further evaluation and continuation of maintenance Kanjinti.  She currently feels well and is asymptomatic.  Since discontinuing letrozole and initiating anastrozole her hot flashes and joint pain have resolved. She has no neurologic complaints.  She denies any recent fevers or illnesses.  She has a good appetite and denies weight loss.  She denies any chest pain, shortness of breath, cough, or hemoptysis.  She denies any nausea, vomiting, constipation, or diarrhea.  She has no urinary complaints.  Patient offers no specific complaints today.  REVIEW OF SYSTEMS:   Review of Systems  Constitutional: Negative.  Negative for fever, malaise/fatigue and weight loss.  Respiratory: Negative.  Negative for cough, hemoptysis and shortness of breath.   Cardiovascular: Negative.  Negative for chest pain and leg swelling.  Gastrointestinal: Negative.  Negative for abdominal pain, blood in stool and melena.  Genitourinary: Negative.  Negative for dysuria.  Musculoskeletal: Negative.  Negative for back pain.  Skin: Negative.  Negative for rash.  Neurological: Negative.  Negative for seizures, weakness and headaches.  Psychiatric/Behavioral: Negative.  The patient is not nervous/anxious.     As per HPI. Otherwise, a complete review of systems is negative.  PAST MEDICAL HISTORY: Past Medical History:  Diagnosis Date   Anginal pain ([place])    Arthritis    Breast cancer ([place])    Carpal tunnel syndrome of right wrist    Coronary  atherosclerosis of unspecified type of vessel, native or graft    Hand and foot pain    Hyperlipidemia    Hypertension    MVP (mitral valve prolapse)    Personal history of chemotherapy    Personal history of radiation therapy    Sleep apnea    Vertigo     PAST SURGICAL HISTORY: Past Surgical History:  Procedure Laterality Date   BREAST BIOPSY Right 08/27/2018   [place] bx, [facility]   BREAST LUMPECTOMY Right 09/11/2018   BREAST LUMPECTOMY WITH NEEDLE LOCALIZATION AND AXILLARY SENTINEL LYMPH NODE BX Right 09/11/2018   Procedure: BREAST LUMPECTOMY WITH NEEDLE LOCALIZATION AND SENTINEL LYMPH NODE BX;  Surgeon: [name], MD;  Location: [facility];  Service: General;  Laterality: Right;   CARDIAC CATHETERIZATION     CATARACT EXTRACTION W/PHACO Left 01/01/2017   Procedure: CATARACT EXTRACTION PHACO AND INTRAOCULAR LENS PLACEMENT (IOC)left Restor lens;  Surgeon: Leandrew Koyanagi, MD;  Location: [place];  Service: Ophthalmology;  Laterality: Left;  Restor lens   CATARACT EXTRACTION W/PHACO Right 01/31/2017   Procedure: CATARACT EXTRACTION PHACO AND INTRAOCULAR LENS PLACEMENT (IOC);  Surgeon: Leandrew Koyanagi, MD;  Location: [place];  Service: Ophthalmology;  Laterality: Right;   CESAREAN SECTION     CHOLECYSTECTOMY     HYSTEROSCOPY  06/2010   D&C   PORTACATH PLACEMENT Right 09/11/2018   Procedure: INSERTION PORT-A-CATH;  Surgeon: [name], MD;  Location: [facility];  Service: General;  Laterality: Right;   TUBAL LIGATION      FAMILY HISTORY: Family History  Problem Relation Age of Onset   Hypertension Mother    Cancer Mother 69  LUNG   Hypertension Father    Breast cancer Neg Hx     ADVANCED DIRECTIVES (Y/N):  N  HEALTH MAINTENANCE: Social History   Tobacco Use   Smoking status: Never Smoker   Smokeless tobacco: Never Used  Substance Use Topics   Alcohol use: Yes    Alcohol/week: 3.0 standard drinks    Types: 3  Shots of liquor per week    Comment: social drinker   Drug use: No     Colonoscopy:  PAP:  Bone density:  Lipid panel:  Allergies  Allergen Reactions   Isosorbide Nitrate Other (See Comments)    headache    Current Outpatient Medications  Medication Sig Dispense Refill   amLODipine-benazepril (LOTREL) 10-20 MG per capsule Take 1 capsule by mouth daily.      anastrozole (ARIMIDEX) 1 MG tablet Take 1 tablet (1 mg total) by mouth daily. 90 tablet 3   Cholecalciferol (VITAMIN D3) 1.25 MG (50000 UT) CAPS      diclofenac (VOLTAREN) 75 MG EC tablet Take 1 tablet (75 mg total) by mouth 2 (two) times daily. 60 tablet 0   ezetimibe (ZETIA) 10 MG tablet      fluticasone (FLONASE) 50 MCG/ACT nasal spray Place 2 sprays into both nostrils daily as needed for allergies.      hydrochlorothiazide (MICROZIDE) 12.5 MG capsule Take 12.5 mg by mouth daily as needed (vertigo).      lidocaine-prilocaine (EMLA) cream Apply to affected area once 30 g 3   LORazepam (ATIVAN) 0.5 MG tablet      meclizine (ANTIVERT) 25 MG tablet Take 25 mg by mouth 3 (three) times daily as needed (Vertigo).      metoprolol tartrate (LOPRESSOR) 100 MG tablet      omeprazole (PRILOSEC) 20 MG capsule Take 20 mg by mouth daily.      pravastatin (PRAVACHOL) 10 MG tablet      venlafaxine XR (EFFEXOR-XR) 75 MG 24 hr capsule Take 75 mg by mouth daily with breakfast.      Current Facility-Administered Medications  Medication Dose Route Frequency Provider Last Rate Last Dose   betamethasone acetate-betamethasone sodium phosphate (CELESTONE) injection 3 mg  3 mg Intramuscular Once Edrick Kins, DPM        OBJECTIVE: There were no vitals filed for this visit.   There is no height or weight on file to calculate BMI.    ECOG FS:0 - Asymptomatic  General: Well-developed, well-nourished, no acute distress. Eyes: Pink conjunctiva, anicteric sclera. HEENT: Normocephalic, moist mucous membranes. Lungs: Clear to  auscultation bilaterally. Heart: Regular rate and rhythm. No rubs, murmurs, or gallops. Abdomen: Soft, nontender, nondistended. No organomegaly noted, normoactive bowel sounds. Musculoskeletal: No edema, cyanosis, or clubbing. Neuro: Alert, answering all questions appropriately. Cranial nerves grossly intact. Skin: No rashes or petechiae noted. Psych: Normal affect.  LAB RESULTS:  Lab Results  Component Value Date   NA 134 (L) 07/03/2019   K 3.8 07/03/2019   CL 103 07/03/2019   CO2 23 07/03/2019   GLUCOSE 91 07/03/2019   BUN 14 07/03/2019   CREATININE 0.73 07/03/2019   CALCIUM 9.1 07/03/2019   PROT 7.8 07/03/2019   ALBUMIN 3.5 07/03/2019   AST 23 07/03/2019   ALT 20 07/03/2019   ALKPHOS 105 07/03/2019   BILITOT 0.4 07/03/2019   GFRNONAA >60 07/03/2019   GFRAA >60 07/03/2019    Lab Results  Component Value Date   WBC 8.1 07/03/2019   NEUTROABS 4.6 07/03/2019   HGB 13.3 07/03/2019  HCT 40.5 07/03/2019   MCV 91.0 07/03/2019   PLT 391 07/03/2019     STUDIES: Nm Cardiac Muga Rest  Result Date: 07/01/2019 CLINICAL DATA:  RIGHT breast cancer, cardiotoxic chemotherapy EXAM: NUCLEAR MEDICINE CARDIAC BLOOD POOL IMAGING (MUGA) TECHNIQUE: Cardiac multi-gated acquisition was performed at rest following intravenous injection of Tc-41mlabeled red blood cells. RADIOPHARMACEUTICALS:  23.30 mCi Tc-923mertechnetate in-vitro labeled red blood cells IV COMPARISON:  03/28/2019 FINDINGS: Calculated LEFT ventricular ejection fraction is 58.2%, only slightly decreased from the 60.9% on the prior exam. Study was performed at a cardiac rate of 81 beats per minute. Patient was rhythmic during imaging. LEFT ventricle demonstrates normal wall motion on cine analysis. IMPRESSION: Normal LEFT ventricular ejection fraction of 58.2%, only minimally decreased from the 60.9% on 03/28/2019. Normal LEFT ventricular wall motion. Electronically Signed   By: MaLavonia Dana.D.   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  She also had port placement at the same time.  She completed 12 weekly cycles of Herceptin plus Taxol on December 26, 2018.  She now will require maintenance Kanjinti (bio similar to Herceptin) every 3 weeks for a total of 12 months.  She has now completed XRT.  Patient has significant side effects of letrozole, therefore has been switched to anastrozole.  She will be required to take a total of 5 years of treatment completing in July 2025.  Patient's most recent MUGA scan on March 28, 2019 was unchanged with an EF of 61%.  Repeat MUGA prior to next treatment.  Mammogram on April 22, 2019 was reported as BI-RADS 2, repeat in August 2021.  Proceed with cycle 12 of maintenance Kanjinti today.  Return to clinic in 3 weeks for treatment only and then in 6 weeks for further evaluation and consideration of cycle 14. 2.  Osteopenia: Patient had a baseline bone mineral density on April 01, 2019 that revealed a T score of -1.6.  Have recommended calcium and vitamin D supplementation.  Repeat in August 2021. 3.  Hot flashes/joint pain: Resolved since discontinuing letrozole.  Anastrozole as above.  I spent a total of 30 minutes face-to-face with the patient of which greater than 50% of the visit was spent in counseling and coordination of care as detailed above.   Patient expressed understanding and was in agreement with this plan. She also understands that She can call clinic at any time with any questions, concerns, or complaints.   Cancer Staging Malignant neoplasm of upper-inner quadrant of right breast in female, estrogen receptor positive ([place]aging form: Breast, AJCC 8th  Edition - Clinical stage from 09/06/2018: Stage IA (cT1c, cN0, cM0, G3, ER+, PR+, HER2+) - Signed by FiLloyd HugerMD on 09/06/2018   TiLloyd HugerMD   07/23/2019 2:45 PM

## 2019-07-24 ENCOUNTER — Other Ambulatory Visit: Payer: Self-pay | Admitting: Oncology

## 2019-07-31 ENCOUNTER — Inpatient Hospital Stay: Payer: BLUE CROSS/BLUE SHIELD

## 2019-07-31 ENCOUNTER — Inpatient Hospital Stay: Payer: BLUE CROSS/BLUE SHIELD | Admitting: Oncology

## 2019-08-03 NOTE — Progress Notes (Signed)
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  INTERVAL HISTORY: Patient returns to clinic today for further evaluation and continuation of maintenance Kanjinti.  Her treatment was delayed a week because there was a concern of Covid exposure, but testing was reported as negative.  She continues to tolerate her treatments well without significant side effects.  She is tolerating anastrozole. She has no neurologic complaints.  She denies any recent fevers or illnesses.  She has a good appetite and denies weight loss.  She denies any chest pain, shortness of breath, cough, or hemoptysis.  She denies any nausea, vomiting, constipation, or diarrhea.  She has no urinary complaints.  Patient offers no specific complaints today.  REVIEW OF SYSTEMS:   Review of Systems  Constitutional: Negative.  Negative for fever, malaise/fatigue and weight loss.  Respiratory: Negative.  Negative for cough, hemoptysis and shortness of breath.   Cardiovascular: Negative.  Negative for chest pain and leg swelling.  Gastrointestinal: Negative.  Negative for abdominal pain, blood in stool and melena.  Genitourinary: Negative.  Negative for dysuria.  Musculoskeletal: Negative.  Negative for back pain.  Skin: Negative.  Negative for rash.  Neurological: Negative.  Negative for seizures, weakness and headaches.  Psychiatric/Behavioral: Negative.  The patient is not nervous/anxious.     As per HPI. Otherwise, a complete review of systems is negative.  PAST MEDICAL HISTORY: Past Medical History:  Diagnosis Date   Anginal pain ([place])    Arthritis      Breast cancer ([place])    Carpal tunnel syndrome of right wrist    Coronary atherosclerosis of unspecified type of vessel, native or graft    Hand and foot pain    Hyperlipidemia    Hypertension    MVP (mitral valve prolapse)    Personal history of chemotherapy    Personal history of radiation therapy    Sleep apnea    Vertigo     PAST SURGICAL HISTORY: Past Surgical History:  Procedure Laterality Date   BREAST BIOPSY Right 08/27/2018   [place] bx, [facility]   BREAST LUMPECTOMY Right 09/11/2018   BREAST LUMPECTOMY WITH NEEDLE LOCALIZATION AND AXILLARY SENTINEL LYMPH NODE BX Right 09/11/2018   Procedure: BREAST LUMPECTOMY WITH NEEDLE LOCALIZATION AND SENTINEL LYMPH NODE BX;  Surgeon: [name], MD;  Location: [facility];  Service: General;  Laterality: Right;   CARDIAC CATHETERIZATION     CATARACT EXTRACTION W/PHACO Left 01/01/2017   Procedure: CATARACT EXTRACTION PHACO AND INTRAOCULAR LENS PLACEMENT (IOC)left Restor lens;  Surgeon: Leandrew Koyanagi, MD;  Location: [place];  Service: Ophthalmology;  Laterality: Left;  Restor lens   CATARACT EXTRACTION W/PHACO Right 01/31/2017   Procedure: CATARACT EXTRACTION PHACO AND INTRAOCULAR LENS PLACEMENT (IOC);  Surgeon: Leandrew Koyanagi, MD;  Location: [place];  Service: Ophthalmology;  Laterality: Right;   CESAREAN SECTION     CHOLECYSTECTOMY     HYSTEROSCOPY  06/2010   D&C   PORTACATH PLACEMENT Right 09/11/2018   Procedure: INSERTION PORT-A-CATH;  Surgeon: [name], MD;  Location: [facility];  Service: General;  Laterality: Right;   TUBAL LIGATION      FAMILY HISTORY: Family History  Problem Relation  Age of Onset   Hypertension Mother    Cancer Mother 72       LUNG   Hypertension Father    Breast cancer Neg Hx     ADVANCED DIRECTIVES (Y/N):  N  HEALTH MAINTENANCE: Social History   Tobacco Use   Smoking status: Never Smoker   Smokeless tobacco: Never Used  Substance  Use Topics   Alcohol use: Yes    Alcohol/week: 3.0 standard drinks    Types: 3 Shots of liquor per week    Comment: social drinker   Drug use: No     Colonoscopy:  PAP:  Bone density:  Lipid panel:  Allergies  Allergen Reactions   Isosorbide Nitrate Other (See Comments)    headache    Current Outpatient Medications  Medication Sig Dispense Refill   amLODipine-benazepril (LOTREL) 10-20 MG per capsule Take 1 capsule by mouth daily.      anastrozole (ARIMIDEX) 1 MG tablet Take 1 tablet (1 mg total) by mouth daily. 90 tablet 3   Cholecalciferol (VITAMIN D3) 1.25 MG (50000 UT) CAPS      diclofenac (VOLTAREN) 75 MG EC tablet Take 1 tablet (75 mg total) by mouth 2 (two) times daily. 60 tablet 0   ezetimibe (ZETIA) 10 MG tablet      fluticasone (FLONASE) 50 MCG/ACT nasal spray Place 2 sprays into both nostrils daily as needed for allergies.      hydrochlorothiazide (MICROZIDE) 12.5 MG capsule Take 12.5 mg by mouth daily as needed (vertigo).      lidocaine-prilocaine (EMLA) cream Apply to affected area once 30 g 3   LORazepam (ATIVAN) 0.5 MG tablet      meclizine (ANTIVERT) 25 MG tablet Take 25 mg by mouth 3 (three) times daily as needed (Vertigo).      metoprolol tartrate (LOPRESSOR) 100 MG tablet      omeprazole (PRILOSEC) 20 MG capsule Take 20 mg by mouth daily.      pravastatin (PRAVACHOL) 10 MG tablet      venlafaxine XR (EFFEXOR-XR) 75 MG 24 hr capsule Take 75 mg by mouth daily with breakfast.      Current Facility-Administered Medications  Medication Dose Route Frequency Provider Last Rate Last Admin   betamethasone acetate-betamethasone sodium phosphate (CELESTONE) injection 3 mg  3 mg Intramuscular Once Edrick Kins, DPM        OBJECTIVE: Vitals:   08/07/19 0922  BP: (!) 136/96  Pulse: 82  Temp: 98 F (36.7 C)     Body mass index is 37.61 kg/m.    ECOG FS:0 - Asymptomatic  General: Well-developed, well-nourished, no acute distress. Eyes: Pink  conjunctiva, anicteric sclera. HEENT: Normocephalic, moist mucous membranes. Lungs: No audible wheezing or coughing. Heart: Regular rate and rhythm. Abdomen: Soft, nontender, no obvious distention. Musculoskeletal: No edema, cyanosis, or clubbing. Neuro: Alert, answering all questions appropriately. Cranial nerves grossly intact. Skin: No rashes or petechiae noted. Psych: Normal affect.  LAB RESULTS:  Lab Results  Component Value Date   NA 134 (L) 07/03/2019   K 3.8 07/03/2019   CL 103 07/03/2019   CO2 23 07/03/2019   GLUCOSE 91 07/03/2019   BUN 14 07/03/2019   CREATININE 0.73 07/03/2019   CALCIUM 9.1 07/03/2019   PROT 7.8 07/03/2019   ALBUMIN 3.5 07/03/2019   AST 23 07/03/2019   ALT 20 07/03/2019   ALKPHOS 105 07/03/2019   BILITOT 0.4 07/03/2019   GFRNONAA >60 07/03/2019   GFRAA >60 07/03/2019    Lab Results  Component  Value Date   WBC 7.4 08/07/2019   NEUTROABS 3.6 08/07/2019   HGB 13.6 08/07/2019   HCT 42.0 08/07/2019   MCV 91.9 08/07/2019   PLT 373 08/07/2019     STUDIES: No results found.  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  She also had port placement at the same time.  She completed 12 weekly cycles of Herceptin plus Taxol on December 26, 2018.  Patient initiated maintenance Kanjinti (bio similar to Herceptin) on Jan 16, 2019.   She has now completed XRT.  Patient has significant side effects of letrozole, therefore has been switched to anastrozole.  She will be required to take a total of 5 years of treatment completing in July 2025.  Patient's most recent MUGA on July 01, 2019 reported an EF of approximately 58% which is essentially unchanged from previous.   Mammogram on April 22, 2019 was reported as BI-RADS 2, repeat in August 2021.  Proceed with cycle 13 of maintenance Kanjinti today.  Return to clinic in 3 weeks for treatment only and then in 6 weeks for further evaluation and consideration of cycle 15. 2.  Osteopenia: Patient had a baseline bone mineral density on April 01, 2019 that revealed a T score of -1.6.  Have recommended calcium and vitamin D supplementation.  Repeat in August 2021. 3.  Hot flashes/joint pain: Resolved since discontinuing letrozole.  Anastrozole as above.  I spent a total of 25 minutes face-to-face with the patient and reviewing chart data of which greater than 50% of the visit was spent in counseling and coordination of care as detailed above.  Patient expressed understanding and was in agreement with this plan. She also understands that She can call clinic at any time with any questions, concerns, or complaints.   Cancer Staging Malignant neoplasm of upper-inner quadrant of right breast in female, estrogen receptor positive ([place]) Staging form: Breast, AJCC 8th Edition - Clinical stage from 09/06/2018: Stage IA (cT1c, cN0, cM0, G3, ER+, PR+, HER2+) - Signed by Lloyd Huger, MD on 09/06/2018   Lloyd Huger, MD   08/07/2019 6:40 PM

## 2019-08-06 ENCOUNTER — Other Ambulatory Visit: Payer: Self-pay

## 2019-08-06 NOTE — Progress Notes (Signed)
Patient pre screened for office appointment, no questions or concerns today. Patient reminded of upcoming appointment time and date. 

## 2019-08-07 ENCOUNTER — Inpatient Hospital Stay: Payer: BLUE CROSS/BLUE SHIELD

## 2019-08-07 ENCOUNTER — Inpatient Hospital Stay: Payer: BLUE CROSS/BLUE SHIELD | Attending: Oncology

## 2019-08-07 ENCOUNTER — Other Ambulatory Visit: Payer: Self-pay

## 2019-08-07 ENCOUNTER — Inpatient Hospital Stay (HOSPITAL_BASED_OUTPATIENT_CLINIC_OR_DEPARTMENT_OTHER): Payer: BLUE CROSS/BLUE SHIELD | Admitting: Oncology

## 2019-08-07 VITALS — BP 136/96 | HR 82 | Temp 98.0°F | Wt 226.0 lb

## 2019-08-07 DIAGNOSIS — Z5112 Encounter for antineoplastic immunotherapy: Secondary | ICD-10-CM | POA: Insufficient documentation

## 2019-08-07 DIAGNOSIS — C50211 Malignant neoplasm of upper-inner quadrant of right female breast: Secondary | ICD-10-CM | POA: Diagnosis present

## 2019-08-07 DIAGNOSIS — N951 Menopausal and female climacteric states: Secondary | ICD-10-CM | POA: Insufficient documentation

## 2019-08-07 DIAGNOSIS — Z170 Estrogen receptor positive status [ER+]: Secondary | ICD-10-CM

## 2019-08-07 DIAGNOSIS — M8580 Other specified disorders of bone density and structure, unspecified site: Secondary | ICD-10-CM | POA: Diagnosis not present

## 2019-08-07 DIAGNOSIS — Z95828 Presence of other vascular implants and grafts: Secondary | ICD-10-CM

## 2019-08-07 LAB — CBC WITH DIFFERENTIAL/PLATELET
Abs Immature Granulocytes: 0.02 10*3/uL (ref 0.00–0.07)
Basophils Absolute: 0.1 10*3/uL (ref 0.0–0.1)
Basophils Relative: 1 %
Eosinophils Absolute: 0.2 10*3/uL (ref 0.0–0.5)
Eosinophils Relative: 2 %
HCT: 42 % (ref 36.0–46.0)
Hemoglobin: 13.6 g/dL (ref 12.0–15.0)
Immature Granulocytes: 0 %
Lymphocytes Relative: 33 %
Lymphs Abs: 2.4 10*3/uL (ref 0.7–4.0)
MCH: 29.8 pg (ref 26.0–34.0)
MCHC: 32.4 g/dL (ref 30.0–36.0)
MCV: 91.9 fL (ref 80.0–100.0)
Monocytes Absolute: 1 10*3/uL (ref 0.1–1.0)
Monocytes Relative: 14 %
Neutro Abs: 3.6 10*3/uL (ref 1.7–7.7)
Neutrophils Relative %: 50 %
Platelets: 373 10*3/uL (ref 150–400)
RBC: 4.57 MIL/uL (ref 3.87–5.11)
RDW: 12.9 % (ref 11.5–15.5)
WBC: 7.4 10*3/uL (ref 4.0–10.5)
nRBC: 0 % (ref 0.0–0.2)

## 2019-08-07 MED ORDER — TRASTUZUMAB-ANNS CHEMO 150 MG IV SOLR
600.0000 mg | Freq: Once | INTRAVENOUS | Status: AC
  Administered 2019-08-07: 600 mg via INTRAVENOUS
  Filled 2019-08-07: qty 28.57

## 2019-08-07 MED ORDER — SODIUM CHLORIDE 0.9% FLUSH
10.0000 mL | Freq: Once | INTRAVENOUS | Status: AC
  Administered 2019-08-07: 10 mL via INTRAVENOUS
  Filled 2019-08-07: qty 10

## 2019-08-07 MED ORDER — HEPARIN SOD (PORK) LOCK FLUSH 100 UNIT/ML IV SOLN
500.0000 [IU] | Freq: Once | INTRAVENOUS | Status: AC | PRN
  Administered 2019-08-07: 500 [IU]
  Filled 2019-08-07: qty 5

## 2019-08-07 MED ORDER — ACETAMINOPHEN 325 MG PO TABS
650.0000 mg | ORAL_TABLET | Freq: Once | ORAL | Status: AC
  Administered 2019-08-07: 650 mg via ORAL
  Filled 2019-08-07: qty 2

## 2019-08-07 MED ORDER — DIPHENHYDRAMINE HCL 25 MG PO CAPS
25.0000 mg | ORAL_CAPSULE | Freq: Once | ORAL | Status: AC
  Administered 2019-08-07: 25 mg via ORAL
  Filled 2019-08-07: qty 1

## 2019-08-07 MED ORDER — DEXAMETHASONE SODIUM PHOSPHATE 10 MG/ML IJ SOLN
10.0000 mg | Freq: Once | INTRAMUSCULAR | Status: AC
  Administered 2019-08-07: 10 mg via INTRAVENOUS
  Filled 2019-08-07: qty 1

## 2019-08-07 MED ORDER — SODIUM CHLORIDE 0.9 % IV SOLN
Freq: Once | INTRAVENOUS | Status: AC
  Administered 2019-08-07: 10:00:00 via INTRAVENOUS
  Filled 2019-08-07: qty 250

## 2019-08-27 ENCOUNTER — Other Ambulatory Visit: Payer: Self-pay

## 2019-08-28 ENCOUNTER — Other Ambulatory Visit: Payer: Self-pay

## 2019-08-28 ENCOUNTER — Inpatient Hospital Stay: Payer: BLUE CROSS/BLUE SHIELD

## 2019-08-28 VITALS — BP 136/92 | HR 83 | Resp 18 | Wt 227.0 lb

## 2019-08-28 DIAGNOSIS — C50211 Malignant neoplasm of upper-inner quadrant of right female breast: Secondary | ICD-10-CM

## 2019-08-28 DIAGNOSIS — Z5112 Encounter for antineoplastic immunotherapy: Secondary | ICD-10-CM | POA: Diagnosis not present

## 2019-08-28 MED ORDER — HEPARIN SOD (PORK) LOCK FLUSH 100 UNIT/ML IV SOLN
INTRAVENOUS | Status: AC
  Filled 2019-08-28: qty 5

## 2019-08-28 MED ORDER — DEXAMETHASONE SODIUM PHOSPHATE 10 MG/ML IJ SOLN
10.0000 mg | Freq: Once | INTRAMUSCULAR | Status: AC
  Administered 2019-08-28: 14:00:00 10 mg via INTRAVENOUS
  Filled 2019-08-28: qty 1

## 2019-08-28 MED ORDER — DIPHENHYDRAMINE HCL 25 MG PO CAPS
25.0000 mg | ORAL_CAPSULE | Freq: Once | ORAL | Status: AC
  Administered 2019-08-28: 25 mg via ORAL
  Filled 2019-08-28: qty 1

## 2019-08-28 MED ORDER — TRASTUZUMAB-ANNS CHEMO 150 MG IV SOLR
600.0000 mg | Freq: Once | INTRAVENOUS | Status: AC
  Administered 2019-08-28: 600 mg via INTRAVENOUS
  Filled 2019-08-28: qty 28.57

## 2019-08-28 MED ORDER — ACETAMINOPHEN 325 MG PO TABS
650.0000 mg | ORAL_TABLET | Freq: Once | ORAL | Status: AC
  Administered 2019-08-28: 650 mg via ORAL
  Filled 2019-08-28: qty 2

## 2019-08-28 MED ORDER — SODIUM CHLORIDE 0.9 % IV SOLN
Freq: Once | INTRAVENOUS | Status: AC
  Filled 2019-08-28: qty 250

## 2019-08-28 MED ORDER — HEPARIN SOD (PORK) LOCK FLUSH 100 UNIT/ML IV SOLN
500.0000 [IU] | Freq: Once | INTRAVENOUS | Status: AC | PRN
  Administered 2019-08-28: 500 [IU]
  Filled 2019-08-28: qty 5

## 2019-09-12 NOTE — Progress Notes (Deleted)
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  INTERVAL HISTORY: Patient returns to clinic today for further evaluation and continuation of maintenance Kanjinti.  Her treatment was delayed a week because there was a concern of Covid exposure, but testing was reported as negative.  She continues to tolerate her treatments well without significant side effects.  She is tolerating anastrozole. She has no neurologic complaints.  She denies any recent fevers or illnesses.  She has a good appetite and denies weight loss.  She denies any chest pain, shortness of breath, cough, or hemoptysis.  She denies any nausea, vomiting, constipation, or diarrhea.  She has no urinary complaints.  Patient offers no specific complaints today.  REVIEW OF SYSTEMS:   Review of Systems  Constitutional: Negative.  Negative for fever, malaise/fatigue and weight loss.  Respiratory: Negative.  Negative for cough, hemoptysis and shortness of breath.   Cardiovascular: Negative.  Negative for chest pain and leg swelling.  Gastrointestinal: Negative.  Negative for abdominal pain, blood in stool and melena.  Genitourinary: Negative.  Negative for dysuria.  Musculoskeletal: Negative.  Negative for back pain.  Skin: Negative.  Negative for rash.  Neurological: Negative.  Negative for seizures, weakness and headaches.  Psychiatric/Behavioral: Negative.  The patient is not nervous/anxious.     As per HPI. Otherwise, a complete review of systems is negative.  PAST MEDICAL HISTORY: Past Medical History:  Diagnosis Date  . Anginal pain ([place])   . Arthritis     . Breast cancer ([place])   . Carpal tunnel syndrome of right wrist   . Coronary atherosclerosis of unspecified type of vessel, native or graft   . Hand and foot pain   . Hyperlipidemia   . Hypertension   . MVP (mitral valve prolapse)   . Personal history of chemotherapy   . Personal history of radiation therapy   . Sleep apnea   . Vertigo     PAST SURGICAL HISTORY: Past Surgical History:  Procedure Laterality Date  . BREAST BIOPSY Right 08/27/2018   [place] bx, IMC  . BREAST LUMPECTOMY Right 09/11/2018  . BREAST LUMPECTOMY WITH NEEDLE LOCALIZATION AND AXILLARY SENTINEL LYMPH NODE BX Right 09/11/2018   Procedure: BREAST LUMPECTOMY WITH NEEDLE LOCALIZATION AND SENTINEL LYMPH NODE BX;  Surgeon: [name], MD;  Location: [facility];  Service: General;  Laterality: Right;  . CARDIAC CATHETERIZATION    . CATARACT EXTRACTION W/PHACO Left 01/01/2017   Procedure: CATARACT EXTRACTION PHACO AND INTRAOCULAR LENS PLACEMENT (IOC)left Restor lens;  Surgeon: Leandrew Koyanagi, MD;  Location: [place];  Service: Ophthalmology;  Laterality: Left;  Restor lens  . CATARACT EXTRACTION W/PHACO Right 01/31/2017   Procedure: CATARACT EXTRACTION PHACO AND INTRAOCULAR LENS PLACEMENT (IOC);  Surgeon: Leandrew Koyanagi, MD;  Location: [place];  Service: Ophthalmology;  Laterality: Right;  . CESAREAN SECTION    . CHOLECYSTECTOMY    . HYSTEROSCOPY  06/2010   D&C  . PORTACATH PLACEMENT Right 09/11/2018   Procedure: INSERTION PORT-A-CATH;  Surgeon: [name], MD;  Location: [facility];  Service: General;  Laterality: Right;  . TUBAL LIGATION      FAMILY HISTORY: Family History  Problem Relation  Age of Onset  . Hypertension Mother   . Cancer Mother 96       LUNG  . Hypertension Father   . Breast cancer Neg Hx     ADVANCED DIRECTIVES (Y/N):  N  HEALTH MAINTENANCE: Social History   Tobacco Use  . Smoking status: Never Smoker  . Smokeless tobacco: Never Used  Substance  Use Topics  . Alcohol use: Yes    Alcohol/week: 3.0 standard drinks    Types: 3 Shots of liquor per week    Comment: social drinker  . Drug use: No     Colonoscopy:  PAP:  Bone density:  Lipid panel:  Allergies  Allergen Reactions  . Isosorbide Nitrate Other (See Comments)    headache    Current Outpatient Medications  Medication Sig Dispense Refill  . amLODipine-benazepril (LOTREL) 10-20 MG per capsule Take 1 capsule by mouth daily.     Marland Kitchen anastrozole (ARIMIDEX) 1 MG tablet Take 1 tablet (1 mg total) by mouth daily. 90 tablet 3  . Cholecalciferol (VITAMIN D3) 1.25 MG (50000 UT) CAPS     . diclofenac (VOLTAREN) 75 MG EC tablet Take 1 tablet (75 mg total) by mouth 2 (two) times daily. 60 tablet 0  . ezetimibe (ZETIA) 10 MG tablet     . fluticasone (FLONASE) 50 MCG/ACT nasal spray Place 2 sprays into both nostrils daily as needed for allergies.     . hydrochlorothiazide (MICROZIDE) 12.5 MG capsule Take 12.5 mg by mouth daily as needed (vertigo).     Marland Kitchen lidocaine-prilocaine (EMLA) cream Apply to affected area once 30 g 3  . LORazepam (ATIVAN) 0.5 MG tablet     . meclizine (ANTIVERT) 25 MG tablet Take 25 mg by mouth 3 (three) times daily as needed (Vertigo).     . metoprolol tartrate (LOPRESSOR) 100 MG tablet     . omeprazole (PRILOSEC) 20 MG capsule Take 20 mg by mouth daily.     . pravastatin (PRAVACHOL) 10 MG tablet     . venlafaxine XR (EFFEXOR-XR) 75 MG 24 hr capsule Take 75 mg by mouth daily with breakfast.      Current Facility-Administered Medications  Medication Dose Route Frequency Provider Last Rate Last Admin  . betamethasone acetate-betamethasone sodium phosphate (CELESTONE) injection 3 mg  3 mg Intramuscular Once Edrick Kins, DPM        OBJECTIVE: There were no vitals filed for this visit.   There is no height or weight on file to calculate BMI.    ECOG FS:0 - Asymptomatic  General: Well-developed, well-nourished, no acute distress. Eyes: Pink conjunctiva,  anicteric sclera. HEENT: Normocephalic, moist mucous membranes. Lungs: No audible wheezing or coughing. Heart: Regular rate and rhythm. Abdomen: Soft, nontender, no obvious distention. Musculoskeletal: No edema, cyanosis, or clubbing. Neuro: Alert, answering all questions appropriately. Cranial nerves grossly intact. Skin: No rashes or petechiae noted. Psych: Normal affect.  LAB RESULTS:  Lab Results  Component Value Date   NA 134 (L) 07/03/2019   K 3.8 07/03/2019   CL 103 07/03/2019   CO2 23 07/03/2019   GLUCOSE 91 07/03/2019   BUN 14 07/03/2019   CREATININE 0.73 07/03/2019   CALCIUM 9.1 07/03/2019   PROT 7.8 07/03/2019   ALBUMIN 3.5 07/03/2019   AST 23 07/03/2019   ALT 20 07/03/2019   ALKPHOS 105 07/03/2019   BILITOT 0.4 07/03/2019   GFRNONAA >60 07/03/2019   GFRAA >60 07/03/2019    Lab Results  Component Value Date   WBC 7.4 08/07/2019  NEUTROABS 3.6 08/07/2019   HGB 13.6 08/07/2019   HCT 42.0 08/07/2019   MCV 91.9 08/07/2019   PLT 373 08/07/2019     STUDIES: No results found.  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  She also had port placement at the same time.  She completed 12 weekly cycles of Herceptin plus Taxol on December 26, 2018.  Patient initiated maintenance Kanjinti (bio similar to Herceptin) on Jan 16, 2019.   She has now completed XRT.  Patient has significant side effects of letrozole, therefore has been switched to anastrozole.  She will be required to take a total of 5 years of treatment completing in July 2025.  Patient's most recent MUGA on July 01, 2019 reported an EF of approximately 58% which is essentially unchanged from previous.  Mammogram on April 22, 2019 was reported as BI-RADS 2, repeat in August 2021.  Proceed with cycle 13 of maintenance Kanjinti today.  Return to clinic in 3 weeks for treatment only and then in 6 weeks for further evaluation and consideration of cycle 15. 2.  Osteopenia: Patient had a baseline bone mineral density on April 01, 2019 that revealed a T score of -1.6.  Have recommended calcium and vitamin D supplementation.  Repeat in August 2021. 3.  Hot flashes/joint pain: Resolved since discontinuing letrozole.  Anastrozole as above.  I spent a total of 25 minutes face-to-face with the patient and reviewing chart data of which greater than 50% of the visit was spent in counseling and coordination of care as detailed above.  Patient expressed understanding and was in agreement with this plan. She also understands that She can call clinic at any time with any questions, concerns, or complaints.   Cancer Staging Malignant neoplasm of upper-inner quadrant of right breast in female, estrogen receptor positive ([place]) Staging form: Breast, AJCC 8th Edition - Clinical stage from 09/06/2018: Stage IA (cT1c, cN0, cM0, G3, ER+, PR+, HER2+) - Signed by Lloyd Huger, MD on 09/06/2018   Lloyd Huger, MD   09/12/2019 6:46 AM

## 2019-09-18 ENCOUNTER — Inpatient Hospital Stay: Payer: 59

## 2019-09-18 ENCOUNTER — Other Ambulatory Visit: Payer: Self-pay

## 2019-09-18 ENCOUNTER — Ambulatory Visit: Payer: Self-pay | Admitting: Oncology

## 2019-09-18 ENCOUNTER — Ambulatory Visit: Payer: Self-pay

## 2019-09-18 ENCOUNTER — Inpatient Hospital Stay: Payer: 59 | Admitting: Oncology

## 2019-09-20 NOTE — Progress Notes (Signed)
  [place]  Telephone:(336) [phone] Fax:(336) [phone]  ID: Thornell Sartorius OB: November 17, 1960  MR#: [id number]  [id number]  Patient Care Team: Perrin Maltese, MD as PCP - General (Internal Medicine) [name], MD as Consulting Physician (General Surgery)    Lloyd Huger, MD   09/20/2019 11:15 AM     This encounter was created in error - please disregard.

## 2019-09-24 ENCOUNTER — Encounter: Payer: Self-pay | Admitting: Oncology

## 2019-09-24 ENCOUNTER — Ambulatory Visit: Payer: 59 | Attending: Internal Medicine

## 2019-09-24 DIAGNOSIS — Z20822 Contact with and (suspected) exposure to covid-19: Secondary | ICD-10-CM

## 2019-09-24 NOTE — Progress Notes (Signed)
Patient prescreened for appointment. Patient has no concerns or questions.  

## 2019-09-25 ENCOUNTER — Encounter: Payer: Self-pay | Admitting: Oncology

## 2019-09-25 ENCOUNTER — Inpatient Hospital Stay: Payer: Self-pay

## 2019-09-25 ENCOUNTER — Inpatient Hospital Stay: Payer: Self-pay | Admitting: Oncology

## 2019-09-25 LAB — NOVEL CORONAVIRUS, NAA: SARS-CoV-2, NAA: NOT DETECTED

## 2019-09-26 NOTE — Progress Notes (Signed)
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  INTERVAL HISTORY: Patient returns to clinic today for further evaluation and continuation of maintenance Kanjinti.  She had increased sinus congestion last week, therefore her treatment was delayed 1 week.  She currently feels well and is asymptomatic.  She continues to tolerate her treatments without significant side effects.  She is tolerating anastrozole. She has no neurologic complaints.  She denies any fevers.  She has a good appetite and denies weight loss.  She denies any chest pain, shortness of breath, cough, or hemoptysis.  She denies any nausea, vomiting, constipation, or diarrhea.  She has no urinary complaints.  Patient offers no further specific complaints today.  REVIEW OF SYSTEMS:   Review of Systems  Constitutional: Negative.  Negative for fever, malaise/fatigue and weight loss.  Respiratory: Negative.  Negative for cough, hemoptysis and shortness of breath.   Cardiovascular: Negative.  Negative for chest pain and leg swelling.  Gastrointestinal: Negative.  Negative for abdominal pain, blood in stool and melena.  Genitourinary: Negative.  Negative for dysuria.  Musculoskeletal: Negative.  Negative for back pain.  Skin: Negative.  Negative for rash.  Neurological: Negative.  Negative for seizures, weakness and headaches.  Psychiatric/Behavioral: Negative.  The patient is not nervous/anxious.     As per HPI. Otherwise, a complete review of systems is negative.  PAST MEDICAL HISTORY: Past Medical History:  Diagnosis Date  . Anginal pain ([place])   . Arthritis    . Breast cancer ([place])   . Carpal tunnel syndrome of right wrist   . Coronary atherosclerosis of unspecified type of vessel, native or graft   . Hand and foot pain   . Hyperlipidemia   . Hypertension   . MVP (mitral valve prolapse)   . Personal history of chemotherapy   . Personal history of radiation therapy   . Sleep apnea   . Vertigo     PAST SURGICAL HISTORY: Past Surgical History:  Procedure Laterality Date  . BREAST BIOPSY Right 08/27/2018   [place] bx, IMC  . BREAST LUMPECTOMY Right 09/11/2018  . BREAST LUMPECTOMY WITH NEEDLE LOCALIZATION AND AXILLARY SENTINEL LYMPH NODE BX Right 09/11/2018   Procedure: BREAST LUMPECTOMY WITH NEEDLE LOCALIZATION AND SENTINEL LYMPH NODE BX;  Surgeon: [name], MD;  Location: [facility];  Service: General;  Laterality: Right;  . CARDIAC CATHETERIZATION    . CATARACT EXTRACTION W/PHACO Left 01/01/2017   Procedure: CATARACT EXTRACTION PHACO AND INTRAOCULAR LENS PLACEMENT (IOC)left Restor lens;  Surgeon: Leandrew Koyanagi, MD;  Location: [place];  Service: Ophthalmology;  Laterality: Left;  Restor lens  . CATARACT EXTRACTION W/PHACO Right 01/31/2017   Procedure: CATARACT EXTRACTION PHACO AND INTRAOCULAR LENS PLACEMENT (IOC);  Surgeon: Leandrew Koyanagi, MD;  Location: [place];  Service: Ophthalmology;  Laterality: Right;  . CESAREAN SECTION    . CHOLECYSTECTOMY    . HYSTEROSCOPY  06/2010   D&C  . PORTACATH PLACEMENT Right 09/11/2018   Procedure: INSERTION PORT-A-CATH;  Surgeon: [name], MD;  Location: [facility];  Service: General;  Laterality: Right;  . TUBAL LIGATION      FAMILY HISTORY: Family History  Problem Relation Age of  Onset  . Hypertension Mother   . Cancer Mother 43       LUNG  . Hypertension Father   . Breast cancer Neg Hx     ADVANCED DIRECTIVES (Y/N):  N  HEALTH MAINTENANCE: Social History   Tobacco Use  . Smoking status: Never Smoker  . Smokeless tobacco: Never Used  Substance  Use Topics  . Alcohol use: Yes    Alcohol/week: 3.0 standard drinks    Types: 3 Shots of liquor per week    Comment: social drinker  . Drug use: No     Colonoscopy:  PAP:  Bone density:  Lipid panel:  Allergies  Allergen Reactions  . Isosorbide Nitrate Other (See Comments)    headache    Current Outpatient Medications  Medication Sig Dispense Refill  . amLODipine-benazepril (LOTREL) 10-20 MG per capsule Take 1 capsule by mouth daily.     Marland Kitchen anastrozole (ARIMIDEX) 1 MG tablet Take 1 tablet (1 mg total) by mouth daily. 90 tablet 3  . calcium-vitamin D (OSCAL WITH D) 500-200 MG-UNIT TABS tablet Take by mouth.    . Cholecalciferol (VITAMIN D3) 1.25 MG (50000 UT) CAPS     . diclofenac (VOLTAREN) 75 MG EC tablet Take 1 tablet (75 mg total) by mouth 2 (two) times daily. 60 tablet 0  . ezetimibe (ZETIA) 10 MG tablet     . fluticasone (FLONASE) 50 MCG/ACT nasal spray Place 2 sprays into both nostrils daily as needed for allergies.     . hydrochlorothiazide (MICROZIDE) 12.5 MG capsule Take 12.5 mg by mouth daily as needed (vertigo).     Marland Kitchen lidocaine-prilocaine (EMLA) cream Apply to affected area once 30 g 3  . LORazepam (ATIVAN) 0.5 MG tablet     . meclizine (ANTIVERT) 25 MG tablet Take 25 mg by mouth 3 (three) times daily as needed (Vertigo).     . metoprolol tartrate (LOPRESSOR) 100 MG tablet     . omeprazole (PRILOSEC) 20 MG capsule Take 20 mg by mouth daily.     Marland Kitchen omeprazole (PRILOSEC) 40 MG capsule Take 40 mg by mouth daily.    . pravastatin (PRAVACHOL) 10 MG tablet     . venlafaxine XR (EFFEXOR-XR) 75 MG 24 hr capsule Take 75 mg by mouth daily with breakfast.      Current Facility-Administered Medications  Medication Dose Route Frequency Provider Last Rate Last Admin  . betamethasone acetate-betamethasone sodium phosphate (CELESTONE) injection 3 mg  3 mg Intramuscular Once Daylene Katayama M, DPM        OBJECTIVE: Vitals:   10/01/19 0922  BP: (!) 135/97  Pulse: 83  Temp: 97.6 F  (36.4 C)     Body mass index is 37.67 kg/m.    ECOG FS:0 - Asymptomatic  General: Well-developed, well-nourished, no acute distress. Eyes: Pink conjunctiva, anicteric sclera. HEENT: Normocephalic, moist mucous membranes. Lungs: No audible wheezing or coughing. Heart: Regular rate and rhythm. Abdomen: Soft, nontender, no obvious distention. Musculoskeletal: No edema, cyanosis, or clubbing. Neuro: Alert, answering all questions appropriately. Cranial nerves grossly intact. Skin: No rashes or petechiae noted. Psych: Normal affect.  LAB RESULTS:  Lab Results  Component Value Date   NA 135 10/01/2019   K 4.0 10/01/2019   CL 105 10/01/2019   CO2 21 (L) 10/01/2019   GLUCOSE 107 (H) 10/01/2019   BUN 16 10/01/2019   CREATININE 0.80 10/01/2019   CALCIUM 8.9 10/01/2019   PROT 7.9 10/01/2019   ALBUMIN 3.5 10/01/2019   AST 24 10/01/2019  ALT 21 10/01/2019   ALKPHOS 111 10/01/2019   BILITOT 0.4 10/01/2019   GFRNONAA >60 10/01/2019   GFRAA >60 10/01/2019    Lab Results  Component Value Date   WBC 5.1 10/01/2019   NEUTROABS 2.2 10/01/2019   HGB 13.5 10/01/2019   HCT 42.2 10/01/2019   MCV 91.5 10/01/2019   PLT 335 10/01/2019     STUDIES: No results found.  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  She also had port placement at the same time.  She completed 12 weekly cycles of Herceptin plus Taxol on December 26, 2018.  Patient initiated maintenance Kanjinti (bio similar to Herceptin) on Jan 16, 2019.   She has now completed XRT.  Patient has significant side effects of letrozole, therefore has been switched to anastrozole.  She will be required to take a total of 5 years of treatment  completing in July 2025.  Patient's most recent MUGA on July 01, 2019 reported an EF of approximately 58% which is essentially unchanged from previous.  Repeat MUGA prior to next infusion.  Mammogram on April 22, 2019 was reported as BI-RADS 2, repeat in August 2021.  Proceed with cycle 15 of maintenance Kanjinti today.  Return to clinic in 3 weeks for continuation of treatment and then in 6 weeks for further evaluation and consideration of cycle 17. 2.  Osteopenia: Patient had a baseline bone mineral density on April 01, 2019 that revealed a T score of -1.6.  Have recommended calcium and vitamin D supplementation.  Repeat in August 2021. 3.  Hot flashes/joint pain: Resolved since discontinuing letrozole.  Anastrozole as above.  I spent a total of 30 minutes reviewing chart data, face-to-face evaluation with the patient, counseling and coordination of care as detailed above.   Patient expressed understanding and was in agreement with this plan. She also understands that She can call clinic at any time with any questions, concerns, or complaints.   Cancer Staging Malignant neoplasm of upper-inner quadrant of right breast in female, estrogen receptor positive ([place]) Staging form: Breast, AJCC 8th Edition - Clinical stage from 09/06/2018: Stage IA (cT1c, cN0, cM0, G3, ER+, PR+, HER2+) - Signed by Lloyd Huger, MD on 09/06/2018   Lloyd Huger, MD   10/01/2019 12:58 PM

## 2019-09-30 ENCOUNTER — Other Ambulatory Visit: Payer: Self-pay

## 2019-09-30 NOTE — Progress Notes (Signed)
Patient pre screened for office appointment, no questions or concerns today. Patient reminded of upcoming appointment time and date. 

## 2019-10-01 ENCOUNTER — Inpatient Hospital Stay: Payer: 59

## 2019-10-01 ENCOUNTER — Other Ambulatory Visit: Payer: Self-pay

## 2019-10-01 ENCOUNTER — Other Ambulatory Visit: Payer: Self-pay | Admitting: Emergency Medicine

## 2019-10-01 ENCOUNTER — Inpatient Hospital Stay: Payer: 59 | Attending: Oncology | Admitting: Oncology

## 2019-10-01 VITALS — BP 135/97 | HR 83 | Temp 97.6°F | Wt 226.4 lb

## 2019-10-01 DIAGNOSIS — Z5112 Encounter for antineoplastic immunotherapy: Secondary | ICD-10-CM | POA: Insufficient documentation

## 2019-10-01 DIAGNOSIS — I10 Essential (primary) hypertension: Secondary | ICD-10-CM | POA: Insufficient documentation

## 2019-10-01 DIAGNOSIS — Z170 Estrogen receptor positive status [ER+]: Secondary | ICD-10-CM

## 2019-10-01 DIAGNOSIS — I341 Nonrheumatic mitral (valve) prolapse: Secondary | ICD-10-CM | POA: Diagnosis not present

## 2019-10-01 DIAGNOSIS — Z95828 Presence of other vascular implants and grafts: Secondary | ICD-10-CM

## 2019-10-01 DIAGNOSIS — C50211 Malignant neoplasm of upper-inner quadrant of right female breast: Secondary | ICD-10-CM

## 2019-10-01 DIAGNOSIS — I2510 Atherosclerotic heart disease of native coronary artery without angina pectoris: Secondary | ICD-10-CM | POA: Diagnosis not present

## 2019-10-01 DIAGNOSIS — Z923 Personal history of irradiation: Secondary | ICD-10-CM | POA: Insufficient documentation

## 2019-10-01 DIAGNOSIS — N6452 Nipple discharge: Secondary | ICD-10-CM | POA: Insufficient documentation

## 2019-10-01 DIAGNOSIS — M8580 Other specified disorders of bone density and structure, unspecified site: Secondary | ICD-10-CM | POA: Diagnosis not present

## 2019-10-01 LAB — CBC WITH DIFFERENTIAL/PLATELET
Abs Immature Granulocytes: 0.02 10*3/uL (ref 0.00–0.07)
Basophils Absolute: 0.1 10*3/uL (ref 0.0–0.1)
Basophils Relative: 1 %
Eosinophils Absolute: 0.1 10*3/uL (ref 0.0–0.5)
Eosinophils Relative: 2 %
HCT: 42.2 % (ref 36.0–46.0)
Hemoglobin: 13.5 g/dL (ref 12.0–15.0)
Immature Granulocytes: 0 %
Lymphocytes Relative: 41 %
Lymphs Abs: 2.1 10*3/uL (ref 0.7–4.0)
MCH: 29.3 pg (ref 26.0–34.0)
MCHC: 32 g/dL (ref 30.0–36.0)
MCV: 91.5 fL (ref 80.0–100.0)
Monocytes Absolute: 0.7 10*3/uL (ref 0.1–1.0)
Monocytes Relative: 13 %
Neutro Abs: 2.2 10*3/uL (ref 1.7–7.7)
Neutrophils Relative %: 43 %
Platelets: 335 10*3/uL (ref 150–400)
RBC: 4.61 MIL/uL (ref 3.87–5.11)
RDW: 12.6 % (ref 11.5–15.5)
WBC: 5.1 10*3/uL (ref 4.0–10.5)
nRBC: 0 % (ref 0.0–0.2)

## 2019-10-01 LAB — COMPREHENSIVE METABOLIC PANEL
ALT: 21 U/L (ref 0–44)
AST: 24 U/L (ref 15–41)
Albumin: 3.5 g/dL (ref 3.5–5.0)
Alkaline Phosphatase: 111 U/L (ref 38–126)
Anion gap: 9 (ref 5–15)
BUN: 16 mg/dL (ref 6–20)
CO2: 21 mmol/L — ABNORMAL LOW (ref 22–32)
Calcium: 8.9 mg/dL (ref 8.9–10.3)
Chloride: 105 mmol/L (ref 98–111)
Creatinine, Ser: 0.8 mg/dL (ref 0.44–1.00)
GFR calc Af Amer: >60 mL/min (ref >60)
GFR calc non Af Amer: >60 mL/min (ref >60)
Glucose, Bld: 107 mg/dL — ABNORMAL HIGH (ref 70–99)
Potassium: 4 mmol/L (ref 3.5–5.1)
Sodium: 135 mmol/L (ref 135–145)
Total Bilirubin: 0.4 mg/dL (ref 0.3–1.2)
Total Protein: 7.9 g/dL (ref 6.5–8.1)

## 2019-10-01 MED ORDER — TRASTUZUMAB-ANNS CHEMO 150 MG IV SOLR
600.0000 mg | Freq: Once | INTRAVENOUS | Status: AC
  Administered 2019-10-01: 600 mg via INTRAVENOUS
  Filled 2019-10-01: qty 28.57

## 2019-10-01 MED ORDER — HEPARIN SOD (PORK) LOCK FLUSH 100 UNIT/ML IV SOLN
500.0000 [IU] | Freq: Once | INTRAVENOUS | Status: AC | PRN
  Administered 2019-10-01: 500 [IU]
  Filled 2019-10-01: qty 5

## 2019-10-01 MED ORDER — DIPHENHYDRAMINE HCL 25 MG PO CAPS
25.0000 mg | ORAL_CAPSULE | Freq: Once | ORAL | Status: AC
  Administered 2019-10-01: 25 mg via ORAL
  Filled 2019-10-01: qty 1

## 2019-10-01 MED ORDER — HEPARIN SOD (PORK) LOCK FLUSH 100 UNIT/ML IV SOLN
INTRAVENOUS | Status: AC
  Filled 2019-10-01: qty 5

## 2019-10-01 MED ORDER — SODIUM CHLORIDE 0.9% FLUSH
10.0000 mL | Freq: Once | INTRAVENOUS | Status: AC
  Administered 2019-10-01: 10 mL via INTRAVENOUS
  Filled 2019-10-01: qty 10

## 2019-10-01 MED ORDER — ACETAMINOPHEN 325 MG PO TABS
650.0000 mg | ORAL_TABLET | Freq: Once | ORAL | Status: AC
  Administered 2019-10-01: 10:00:00 650 mg via ORAL
  Filled 2019-10-01: qty 2

## 2019-10-01 MED ORDER — SODIUM CHLORIDE 0.9 % IV SOLN
Freq: Once | INTRAVENOUS | Status: AC
  Filled 2019-10-01: qty 250

## 2019-10-08 ENCOUNTER — Other Ambulatory Visit: Payer: Self-pay

## 2019-10-08 ENCOUNTER — Ambulatory Visit
Admission: RE | Admit: 2019-10-08 | Discharge: 2019-10-08 | Disposition: A | Discharge status: Home / Self Care | Payer: 59 | Source: Ambulatory Visit | Attending: Oncology | Admitting: Oncology

## 2019-10-08 DIAGNOSIS — C50211 Malignant neoplasm of upper-inner quadrant of right female breast: Secondary | ICD-10-CM

## 2019-10-08 DIAGNOSIS — Z170 Estrogen receptor positive status [ER+]: Secondary | ICD-10-CM | POA: Diagnosis not present

## 2019-10-08 MED ORDER — TECHNETIUM TC 99M-LABELED RED BLOOD CELLS IV KIT
22.0000 | PACK | Freq: Once | INTRAVENOUS | Status: AC | PRN
  Administered 2019-10-08: 21.7 via INTRAVENOUS

## 2019-10-21 ENCOUNTER — Other Ambulatory Visit: Payer: Self-pay

## 2019-10-22 ENCOUNTER — Other Ambulatory Visit: Payer: Self-pay

## 2019-10-22 ENCOUNTER — Inpatient Hospital Stay (HOSPITAL_BASED_OUTPATIENT_CLINIC_OR_DEPARTMENT_OTHER): Payer: 59 | Admitting: Oncology

## 2019-10-22 ENCOUNTER — Inpatient Hospital Stay: Payer: 59

## 2019-10-22 VITALS — BP 142/88 | HR 92 | Temp 97.7°F | Resp 20 | Wt 229.0 lb

## 2019-10-22 DIAGNOSIS — C50211 Malignant neoplasm of upper-inner quadrant of right female breast: Secondary | ICD-10-CM

## 2019-10-22 DIAGNOSIS — Z170 Estrogen receptor positive status [ER+]: Secondary | ICD-10-CM

## 2019-10-22 DIAGNOSIS — Z5112 Encounter for antineoplastic immunotherapy: Secondary | ICD-10-CM | POA: Diagnosis not present

## 2019-10-22 DIAGNOSIS — N6452 Nipple discharge: Secondary | ICD-10-CM | POA: Diagnosis not present

## 2019-10-22 MED ORDER — TRASTUZUMAB-ANNS CHEMO 150 MG IV SOLR
600.0000 mg | Freq: Once | INTRAVENOUS | Status: AC
  Administered 2019-10-22: 600 mg via INTRAVENOUS
  Filled 2019-10-22: qty 28.57

## 2019-10-22 MED ORDER — HEPARIN SOD (PORK) LOCK FLUSH 100 UNIT/ML IV SOLN
INTRAVENOUS | Status: AC
  Filled 2019-10-22: qty 5

## 2019-10-22 MED ORDER — SODIUM CHLORIDE 0.9 % IV SOLN
Freq: Once | INTRAVENOUS | Status: AC
  Filled 2019-10-22: qty 250

## 2019-10-22 MED ORDER — HEPARIN SOD (PORK) LOCK FLUSH 100 UNIT/ML IV SOLN
500.0000 [IU] | Freq: Once | INTRAVENOUS | Status: AC | PRN
  Administered 2019-10-22: 500 [IU]
  Filled 2019-10-22: qty 5

## 2019-10-22 MED ORDER — SODIUM CHLORIDE 0.9% FLUSH
10.0000 mL | INTRAVENOUS | Status: DC | PRN
  Administered 2019-10-22: 10 mL
  Filled 2019-10-22: qty 10

## 2019-10-22 MED ORDER — DIPHENHYDRAMINE HCL 25 MG PO CAPS
25.0000 mg | ORAL_CAPSULE | Freq: Once | ORAL | Status: AC
  Administered 2019-10-22: 25 mg via ORAL
  Filled 2019-10-22: qty 1

## 2019-10-22 MED ORDER — ACETAMINOPHEN 325 MG PO TABS
650.0000 mg | ORAL_TABLET | Freq: Once | ORAL | Status: AC
  Administered 2019-10-22: 650 mg via ORAL
  Filled 2019-10-22: qty 2

## 2019-10-22 NOTE — Progress Notes (Signed)
Symptom Management Consult note [facility]  Telephone:([phone] Fax:(336) [phone]  Patient Care Team: Perrin Maltese, MD as PCP - General (Internal Medicine) [name], MD as Consulting Physician (General Surgery)   Name of the patient: Erin Church  [id number]  1961/04/11   Date of visit: 10/22/2019   Diagnosis- Right breast cancer   Chief complaint/ Reason for visit- Nipple discharge  Heme/Onc history:  Oncology History  Malignant neoplasm of upper-inner quadrant of right breast in female, estrogen receptor positive ([place])  09/05/2018 Initial Diagnosis   Malignant neoplasm of upper-inner quadrant of right breast in female, estrogen receptor positive ([place])   09/06/2018 Cancer Staging   Staging form: Breast, AJCC 8th Edition - Clinical stage from 09/06/2018: Stage IA (cT1c, cN0, cM0, G3, ER+, PR+, HER2+) - Signed by Lloyd Huger, MD on 09/06/2018   10/10/2018 -  Chemotherapy   The patient had trastuzumab (HERCEPTIN) 399 mg in sodium chloride 0.9 % 250 mL chemo infusion, 4 mg/kg = 399 mg, Intravenous,  Once, 7 of 7 cycles Administration: 399 mg (10/10/2018), 189 mg (10/17/2018), 189 mg (11/07/2018), 600 mg (01/16/2019), 600 mg (02/06/2019), 189 mg (10/24/2018), 189 mg (10/31/2018), 189 mg (11/14/2018), 189 mg (11/21/2018), 189 mg (11/28/2018), 189 mg (12/05/2018), 189 mg (12/12/2018), 189 mg (12/19/2018), 189 mg (12/26/2018), 600 mg (02/27/2019), 600 mg (03/20/2019) PACLitaxel (TAXOL) 168 mg in sodium chloride 0.9 % 250 mL chemo infusion (</= 34m/m2), 80 mg/m2 = 168 mg, Intravenous,  Once, 3 of 3 cycles Administration: 168 mg (10/10/2018), 168 mg (10/17/2018), 168 mg (11/07/2018), 168 mg (10/24/2018), 168 mg (10/31/2018), 168 mg (11/14/2018), 168 mg (11/21/2018), 168 mg (11/28/2018), 168 mg (12/05/2018), 168 mg (12/12/2018), 168 mg (12/19/2018), 168 mg (12/26/2018) trastuzumab-anns (KANJINTI) 600 mg in sodium chloride 0.9 % 250 mL chemo infusion, 588 mg (100 % of original  dose 6 mg/kg), Intravenous,  Once, 9 of 10 cycles Dose modification: 6 mg/kg (original dose 6 mg/kg, Cycle 8, Reason: Other (see comments), Comment: biosimilar) Administration: 600 mg (04/10/2019), 600 mg (05/01/2019), 600 mg (05/22/2019), 600 mg (06/12/2019), 600 mg (07/03/2019), 600 mg (08/07/2019), 600 mg (08/28/2019), 600 mg (10/01/2019), 600 mg (10/22/2019)  for chemotherapy treatment.     Interval history-patient presents today for maintenance Kanjinti and continuation of anastrozole for known right breast cancer . During her infusion she complained of right breast discharge.  Symptoms have been present for 1 day.  Patient states she woke up this morning with discharge on her nightgown and on her bed sheet.  She was unable to reproduce the discharge from nipple.  Denies nipple discharge from left breast.  No evidence of wound.  She denies any pain.  Discharge color is thin in consistency, yellow in color with no odor.  Per patient, no evidence of infection.  She denies of fever.  She denies any blood.  ECOG FS:0 - Asymptomatic  Review of systems- Review of Systems  Constitutional: Negative.  Negative for chills, fever, malaise/fatigue and weight loss.  HENT: Negative for congestion, ear pain and tinnitus.   Eyes: Negative.  Negative for blurred vision and double vision.  Respiratory: Negative.  Negative for cough, sputum production and shortness of breath.   Cardiovascular: Negative.  Negative for chest pain, palpitations and leg swelling.  Gastrointestinal: Negative.  Negative for abdominal pain, constipation, diarrhea, nausea and vomiting.  Genitourinary: Negative for dysuria, frequency and urgency.  Musculoskeletal: Negative for back pain and falls.  Skin: Negative.  Negative for rash.  Right nipple discharge  Neurological: Negative.  Negative for weakness and headaches.  Endo/Heme/Allergies: Negative.  Does not bruise/bleed easily.  Psychiatric/Behavioral: Negative.  Negative for  depression. The patient is not nervous/anxious and does not have insomnia.      Current treatment- s/p 12 cycles of Herceptin plus Taxol in April 2020.  Currently on maintenance Herceptin (Kanijinti) and letrozole.   Allergies  Allergen Reactions  . Isosorbide Nitrate Other (See Comments)    headache     Past Medical History:  Diagnosis Date  . Anginal pain ([place])   . Arthritis   . Breast cancer ([place])   . Carpal tunnel syndrome of right wrist   . Coronary atherosclerosis of unspecified type of vessel, native or graft   . Hand and foot pain   . Hyperlipidemia   . Hypertension   . MVP (mitral valve prolapse)   . Personal history of chemotherapy   . Personal history of radiation therapy   . Sleep apnea   . Vertigo      Past Surgical History:  Procedure Laterality Date  . BREAST BIOPSY Right 08/27/2018   [place] bx, IMC  . BREAST LUMPECTOMY Right 09/11/2018  . BREAST LUMPECTOMY WITH NEEDLE LOCALIZATION AND AXILLARY SENTINEL LYMPH NODE BX Right 09/11/2018   Procedure: BREAST LUMPECTOMY WITH NEEDLE LOCALIZATION AND SENTINEL LYMPH NODE BX;  Surgeon: [name], MD;  Location: [facility];  Service: General;  Laterality: Right;  . CARDIAC CATHETERIZATION    . CATARACT EXTRACTION W/PHACO Left 01/01/2017   Procedure: CATARACT EXTRACTION PHACO AND INTRAOCULAR LENS PLACEMENT (IOC)left Restor lens;  Surgeon: Leandrew Koyanagi, MD;  Location: [place];  Service: Ophthalmology;  Laterality: Left;  Restor lens  . CATARACT EXTRACTION W/PHACO Right 01/31/2017   Procedure: CATARACT EXTRACTION PHACO AND INTRAOCULAR LENS PLACEMENT (IOC);  Surgeon: Leandrew Koyanagi, MD;  Location: [place];  Service: Ophthalmology;  Laterality: Right;  . CESAREAN SECTION    . CHOLECYSTECTOMY    . HYSTEROSCOPY  06/2010   D&C  . PORTACATH PLACEMENT Right 09/11/2018   Procedure: INSERTION PORT-A-CATH;  Surgeon: [name], MD;  Location: [facility];  Service: General;  Laterality:  Right;  . TUBAL LIGATION      Social History   Socioeconomic History  . Marital status: Widowed    Spouse name: Not on file  . Number of children: 3  . Years of education: 29  . Highest education level: Not on file  Occupational History  . Occupation: SELF EMPLOYED  Tobacco Use  . Smoking status: Never Smoker  . Smokeless tobacco: Never Used  Substance and Sexual Activity  . Alcohol use: Yes    Alcohol/week: 3.0 standard drinks    Types: 3 Shots of liquor per week    Comment: social drinker  . Drug use: No  . Sexual activity: Yes    Birth control/protection: Post-menopausal  Other Topics Concern  . Not on file  Social History Narrative  . Not on file   Social Determinants of Health   Financial Resource Strain:   . Difficulty of Paying Living Expenses: Not on file  Food Insecurity:   . Worried About Charity fundraiser in the Last Year: Not on file  . Ran Out of Food in the Last Year: Not on file  Transportation Needs:   . Lack of Transportation (Medical): Not on file  . Lack of Transportation (Non-Medical): Not on file  Physical Activity:   . Days of Exercise per Week: Not on file  .  Minutes of Exercise per Session: Not on file  Stress:   . Feeling of Stress : Not on file  Social Connections:   . Frequency of Communication with Friends and Family: Not on file  . Frequency of Social Gatherings with Friends and Family: Not on file  . Attends Religious Services: Not on file  . Active Member of Clubs or Organizations: Not on file  . Attends Archivist Meetings: Not on file  . Marital Status: Not on file  Intimate Partner Violence:   . Fear of Current or Ex-Partner: Not on file  . Emotionally Abused: Not on file  . Physically Abused: Not on file  . Sexually Abused: Not on file    Family History  Problem Relation Age of Onset  . Hypertension Mother   . Cancer Mother 73       LUNG  . Hypertension Father   . Breast cancer Neg Hx      Current  Outpatient Medications:  .  amLODipine-benazepril (LOTREL) 10-20 MG per capsule, Take 1 capsule by mouth daily. , Disp: , Rfl:  .  anastrozole (ARIMIDEX) 1 MG tablet, Take 1 tablet (1 mg total) by mouth daily., Disp: 90 tablet, Rfl: 3 .  calcium-vitamin D (OSCAL WITH D) 500-200 MG-UNIT TABS tablet, Take by mouth., Disp: , Rfl:  .  Cholecalciferol (VITAMIN D3) 1.25 MG (50000 UT) CAPS, , Disp: , Rfl:  .  diclofenac (VOLTAREN) 75 MG EC tablet, Take 1 tablet (75 mg total) by mouth 2 (two) times daily., Disp: 60 tablet, Rfl: 0 .  ezetimibe (ZETIA) 10 MG tablet, , Disp: , Rfl:  .  fluticasone (FLONASE) 50 MCG/ACT nasal spray, Place 2 sprays into both nostrils daily as needed for allergies. , Disp: , Rfl:  .  hydrochlorothiazide (MICROZIDE) 12.5 MG capsule, Take 12.5 mg by mouth daily as needed (vertigo). , Disp: , Rfl:  .  lidocaine-prilocaine (EMLA) cream, Apply to affected area once, Disp: 30 g, Rfl: 3 .  LORazepam (ATIVAN) 0.5 MG tablet, , Disp: , Rfl:  .  meclizine (ANTIVERT) 25 MG tablet, Take 25 mg by mouth 3 (three) times daily as needed (Vertigo). , Disp: , Rfl:  .  metoprolol tartrate (LOPRESSOR) 100 MG tablet, , Disp: , Rfl:  .  omeprazole (PRILOSEC) 20 MG capsule, Take 20 mg by mouth daily. , Disp: , Rfl:  .  omeprazole (PRILOSEC) 40 MG capsule, Take 40 mg by mouth daily., Disp: , Rfl:  .  pravastatin (PRAVACHOL) 10 MG tablet, , Disp: , Rfl:  .  venlafaxine XR (EFFEXOR-XR) 75 MG 24 hr capsule, Take 75 mg by mouth daily with breakfast. , Disp: , Rfl:   Current Facility-Administered Medications:  .  betamethasone acetate-betamethasone sodium phosphate (CELESTONE) injection 3 mg, 3 mg, Intramuscular, Once, Edrick Kins, DPM  Physical exam: There were no vitals filed for this visit. Physical Exam Constitutional:      Appearance: Normal appearance.  HENT:     Head: Normocephalic and atraumatic.  Eyes:     Pupils: Pupils are equal, round, and reactive to light.  Cardiovascular:      Rate and Rhythm: Normal rate and regular rhythm.     Heart sounds: Normal heart sounds. No murmur.  Pulmonary:     Effort: Pulmonary effort is normal.     Breath sounds: Normal breath sounds. No wheezing.  Chest:     Breasts:        Right: Nipple discharge (Non-reproducable) present. No swelling, bleeding, inverted  nipple, skin change or tenderness.   Abdominal:     General: Bowel sounds are normal. There is no distension.     Palpations: Abdomen is soft.     Tenderness: There is no abdominal tenderness.  Musculoskeletal:        General: Normal range of motion.     Cervical back: Normal range of motion.  Lymphadenopathy:     Upper Body:     Right upper body: No axillary adenopathy.  Skin:    General: Skin is warm and dry.     Findings: No rash.  Neurological:     Mental Status: She is alert and oriented to person, place, and time.  Psychiatric:        Judgment: Judgment normal.      CMP Latest Ref Rng & Units 10/01/2019  Glucose 70 - 99 mg/dL 107(H)  BUN 6 - 20 mg/dL 16  Creatinine 0.44 - 1.00 mg/dL 0.80  Sodium 135 - 145 mmol/L 135  Potassium 3.5 - 5.1 mmol/L 4.0  Chloride 98 - 111 mmol/L 105  CO2 22 - 32 mmol/L 21(L)  Calcium 8.9 - 10.3 mg/dL 8.9  Total Protein 6.5 - 8.1 g/dL 7.9  Total Bilirubin 0.3 - 1.2 mg/dL 0.4  Alkaline Phos 38 - 126 U/L 111  AST 15 - 41 U/L 24  ALT 0 - 44 U/L 21   CBC Latest Ref Rng & Units 10/01/2019  WBC 4.0 - 10.5 K/uL 5.1  Hemoglobin 12.0 - 15.0 g/dL 13.5  Hematocrit 36.0 - 46.0 % 42.2  Platelets 150 - 400 K/uL 335    No images are attached to the encounter.  NM Cardiac [facility] [facility]  Result Date: 10/08/2019 CLINICAL DATA:  Breast cancer. Evaluate cardiac function in relation to chemotherapy. EXAM: NUCLEAR MEDICINE CARDIAC BLOOD POOL IMAGING (MUGA) TECHNIQUE: Cardiac multi-gated acquisition was performed at rest following intravenous injection of Tc-58mlabeled red blood cells. RADIOPHARMACEUTICALS:  21.7 mCi Tc-97mertechnetate in-vitro  labeled red blood cells IV COMPARISON:  MUGA scan 06/30/2029, 03/28/2019 FINDINGS: No  focal wall motion abnormality of the left ventricle. Calculated left ventricular ejection fraction equals 54.7 %. This compares with most recent ejection fracture equals 58.2% (07/01/2019) IMPRESSION: Left ventricular ejection fraction equals 54.7 %. Electronically Signed   By: StSuzy Bouchard.D.   On: 10/08/2019 16:37     Assessment and plan- Patient is a [age].o. female who presents to symptom management for complaints of right nipple discharge x1 day.  Currently receiving treatment for 1A ER/PR positive HER-2 positive right breast cancer.  She is status post Herceptin Taxol and currently on Herceptin maintenance with anastrozole.  She also completed XRT.  Has tolerated well to date.  Spoke to Dr. FiGrayland Ormondegarding remote episode of nonreproducible right sided nipple discharge.  Given this is nonreproducible and only occurred on one occasion, Dr. FiGrayland Ormondecommends monitoring for now.  If patient develops additional discharge, we will get a mammogram and ultrasound of that breast.  Patient in agreement with plan and will let usKoreanow if this occurs.  Plan: Monitor for now.  Disposition: RTC sooner if symptoms return RTC as scheduled for additional cycles of Kanjiniti (Herceptin bio similar)   Visit Diagnosis 1. Malignant neoplasm of upper-inner quadrant of right breast in female, estrogen receptor positive ([place]  2. Nipple discharge     Patient expressed understanding and was in agreement with this plan. She also understands that She can call clinic at any time with any questions, concerns, or  complaints.   Greater than 50% was spent in counseling and coordination of care with this patient including but not limited to discussion of the relevant topics above (See A&P) including, but not limited to diagnosis and management of acute and chronic medical conditions.   Thank you for allowing me to  participate in the care of this very pleasant patient.    Jacquelin Hawking, NP [place] at [facility] Cell - [phone] Pager- [phone] 10/23/2019 10:01 AM

## 2019-10-22 NOTE — Progress Notes (Signed)
Patient here for scheduled Kanjinti treatment. Upon arrival patient told this nurse she noticed yellow discharge from her right breast yesterday. No pain, or swelling noticed and no discharge  today. Notified Dr Grayland Ormond who then asked Rulon Abide NP to see patient. NP came to infusion and spoke with patient who relayed same info that she gave me  Patient instructed to continue to monitor and if any discharge is noticed again to call Dr Gary Fleet office for further testing. Patient verbalized understanding. Tolerated today's infusion without any difficulty

## 2019-11-06 ENCOUNTER — Other Ambulatory Visit: Payer: Self-pay | Admitting: Oncology

## 2019-11-06 NOTE — Progress Notes (Signed)
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  INTERVAL HISTORY: Patient returns to clinic today for further evaluation and continuation of maintenance Kanjinti.  She had a one-time episode of clear yellow nipple discharge approximately 3 weeks ago, but this has not recurred.  She otherwise feels well and is asymptomatic. She continues to tolerate her treatments without significant side effects.  She is tolerating anastrozole. She has no neurologic complaints.  She denies any recent fevers or illnesses.  She has a good appetite and denies weight loss.  She denies any chest pain, shortness of breath, cough, or hemoptysis.  She denies any nausea, vomiting, constipation, or diarrhea.  She has no urinary complaints.  Patient offers no further specific complaints today.  REVIEW OF SYSTEMS:   Review of Systems  Constitutional: Negative.  Negative for fever, malaise/fatigue and weight loss.  Respiratory: Negative.  Negative for cough, hemoptysis and shortness of breath.   Cardiovascular: Negative.  Negative for chest pain and leg swelling.  Gastrointestinal: Negative.  Negative for abdominal pain, blood in stool and melena.  Genitourinary: Negative.  Negative for dysuria.  Musculoskeletal: Negative.  Negative for back pain.  Skin: Negative.  Negative for rash.  Neurological: Negative.  Negative for seizures, weakness and headaches.  Psychiatric/Behavioral: Negative.  The patient is not nervous/anxious.     As per HPI. Otherwise, a complete review of systems is negative.  PAST MEDICAL HISTORY: Past Medical History:  Diagnosis  Date  . Anginal pain ([place])   . Arthritis   . Breast cancer ([place])   . Carpal tunnel syndrome of right wrist   . Coronary atherosclerosis of unspecified type of vessel, native or graft   . Hand and foot pain   . Hyperlipidemia   . Hypertension   . MVP (mitral valve prolapse)   . Personal history of chemotherapy   . Personal history of radiation therapy   . Sleep apnea   . Vertigo     PAST SURGICAL HISTORY: Past Surgical History:  Procedure Laterality Date  . BREAST BIOPSY Right 08/27/2018   [place] bx, IMC  . BREAST LUMPECTOMY Right 09/11/2018  . BREAST LUMPECTOMY WITH NEEDLE LOCALIZATION AND AXILLARY SENTINEL LYMPH NODE BX Right 09/11/2018   Procedure: BREAST LUMPECTOMY WITH NEEDLE LOCALIZATION AND SENTINEL LYMPH NODE BX;  Surgeon: [name], MD;  Location: [facility];  Service: General;  Laterality: Right;  . CARDIAC CATHETERIZATION    . CATARACT EXTRACTION W/PHACO Left 01/01/2017   Procedure: CATARACT EXTRACTION PHACO AND INTRAOCULAR LENS PLACEMENT (IOC)left Restor lens;  Surgeon: Leandrew Koyanagi, MD;  Location: [place];  Service: Ophthalmology;  Laterality: Left;  Restor lens  . CATARACT EXTRACTION W/PHACO Right 01/31/2017   Procedure: CATARACT EXTRACTION PHACO AND INTRAOCULAR LENS PLACEMENT (IOC);  Surgeon: Leandrew Koyanagi, MD;  Location: [facility];  Service: Ophthalmology;  Laterality: Right;  . CESAREAN SECTION    . CHOLECYSTECTOMY    . HYSTEROSCOPY  06/2010   D&C  . PORTACATH PLACEMENT Right 09/11/2018   Procedure: INSERTION PORT-A-CATH;  Surgeon: [name], MD;  Location: [facility];  Service: General;  Laterality: Right;  . TUBAL LIGATION      FAMILY HISTORY:  Family History  Problem Relation Age of Onset  . Hypertension Mother   . Cancer Mother 50       LUNG  . Hypertension Father   . Breast cancer Neg Hx     ADVANCED DIRECTIVES (Y/N):  N  HEALTH MAINTENANCE: Social History   Tobacco Use  . Smoking status: Never Smoker  .  Smokeless tobacco: Never Used  Substance Use Topics  . Alcohol use: Yes    Alcohol/week: 3.0 standard drinks    Types: 3 Shots of liquor per week    Comment: social drinker  . Drug use: No     Colonoscopy:  PAP:  Bone density:  Lipid panel:  Allergies  Allergen Reactions  . Isosorbide Nitrate Other (See Comments)    headache    Current Outpatient Medications  Medication Sig Dispense Refill  . amLODipine-benazepril (LOTREL) 10-20 MG per capsule Take 1 capsule by mouth daily.     Marland Kitchen anastrozole (ARIMIDEX) 1 MG tablet Take 1 tablet (1 mg total) by mouth daily. 90 tablet 3  . diclofenac (VOLTAREN) 75 MG EC tablet Take 1 tablet (75 mg total) by mouth 2 (two) times daily. 60 tablet 0  . fluticasone (FLONASE) 50 MCG/ACT nasal spray Place 2 sprays into both nostrils daily as needed for allergies.     . hydrochlorothiazide (MICROZIDE) 12.5 MG capsule Take 12.5 mg by mouth daily as needed (vertigo).     Marland Kitchen lidocaine-prilocaine (EMLA) cream Apply to affected area once 30 g 3  . LORazepam (ATIVAN) 0.5 MG tablet Take 0.5 mg by mouth as needed.     . meclizine (ANTIVERT) 25 MG tablet Take 25 mg by mouth 3 (three) times daily as needed (Vertigo).     Marland Kitchen omeprazole (PRILOSEC) 40 MG capsule Take 40 mg by mouth daily.    Marland Kitchen venlafaxine XR (EFFEXOR-XR) 75 MG 24 hr capsule Take 75 mg by mouth daily with breakfast.      Current Facility-Administered Medications  Medication Dose Route Frequency Provider Last Rate Last Admin  . betamethasone acetate-betamethasone sodium phosphate (CELESTONE) injection 3 mg  3 mg Intramuscular Once Edrick Kins, DPM       Facility-Administered Medications Ordered in Other Visits  Medication Dose Route Frequency Provider Last Rate Last Admin  . 0.9 %  sodium chloride infusion   Intravenous Once Lloyd Huger, MD      . acetaminophen (TYLENOL) tablet 650 mg  650 mg Oral Once Lloyd Huger, MD      . diphenhydrAMINE (BENADRYL) capsule 25 mg  25 mg Oral Once  Lloyd Huger, MD      . heparin lock flush 100 unit/mL  500 Units Intracatheter Once PRN Lloyd Huger, MD      . Theotis Burrow [facility]) 600 mg in sodium chloride 0.9 % 250 mL chemo infusion  600 mg Intravenous Once Lloyd Huger, MD        OBJECTIVE: Vitals:   11/12/19 0932  BP: 128/86  Pulse: 84  Resp: 16  Temp: 98.4 F (36.9 C)  SpO2: 99%     Body mass index is 37.89 kg/m.    ECOG FS:0 - Asymptomatic  General: Well-developed, well-nourished, no acute distress. Eyes: Pink conjunctiva, anicteric sclera. HEENT: Normocephalic, moist mucous membranes. Breast: Exam deferred today. Lungs: No audible wheezing or coughing. Heart: Regular rate and rhythm. Abdomen: Soft, nontender, no obvious distention. Musculoskeletal: No edema, cyanosis, or clubbing. Neuro: Alert, answering all questions appropriately. Cranial nerves grossly intact. Skin: No rashes or  petechiae noted. Psych: Normal affect.  LAB RESULTS:  Lab Results  Component Value Date   NA 136 11/12/2019   K 4.0 11/12/2019   CL 106 11/12/2019   CO2 22 11/12/2019   GLUCOSE 113 (H) 11/12/2019   BUN 18 11/12/2019   CREATININE 0.69 11/12/2019   CALCIUM 8.9 11/12/2019   PROT 7.6 11/12/2019   ALBUMIN 3.6 11/12/2019   AST 25 11/12/2019   ALT 21 11/12/2019   ALKPHOS 103 11/12/2019   BILITOT 0.5 11/12/2019   GFRNONAA >60 11/12/2019   GFRAA >60 11/12/2019    Lab Results  Component Value Date   WBC 5.9 11/12/2019   NEUTROABS 3.1 11/12/2019   HGB 13.4 11/12/2019   HCT 40.4 11/12/2019   MCV 90.0 11/12/2019   PLT 390 11/12/2019     STUDIES: No results found.  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  She  also had port placement at the same time.  She completed 12 weekly cycles of Herceptin plus Taxol on December 26, 2018.  Patient initiated maintenance Kanjinti (bio similar to Herceptin) on Jan 16, 2019.   She has now completed XRT.  Patient has significant side effects of letrozole, therefore has been switched to anastrozole.  She will be required to take a total of 5 years of treatment completing in July 2025.  Patient's most recent MUGA on October 08, 2019 revealed an ejection fraction of 54.7 which is essentially unchanged from previous. Mammogram on April 22, 2019 was reported as BI-RADS 2, repeat in August 2021.  Proceed with her next infusion of maintenance Kanjinti today.  Return to clinic in 3 weeks for continuation of treatment and then in 6 weeks for further evaluation and her final infusion of Kanjinti.  2.  Osteopenia: Patient had a baseline bone mineral density on April 01, 2019 that revealed a T score of -1.6.  Have recommended calcium and vitamin D supplementation.  Repeat in August 2021. 3.  Hot flashes/joint pain: Resolved since discontinuing letrozole.  Anastrozole as above.  I spent a total of 30 minutes reviewing chart data, face-to-face evaluation with the patient, counseling and coordination of care as detailed above.   Patient expressed understanding and was in agreement with this plan. She also understands that She can call clinic at any time with any questions, concerns, or complaints.   Cancer Staging Malignant neoplasm of upper-inner quadrant of right breast in female, estrogen receptor positive ([place]) Staging form: Breast, AJCC 8th Edition - Clinical stage from 09/06/2018: Stage IA (cT1c, cN0, cM0, G3, ER+, PR+, HER2+) - Signed by Lloyd Huger, MD on 09/06/2018   Lloyd Huger, MD   11/12/2019 10:33 AM

## 2019-11-11 ENCOUNTER — Encounter: Payer: Self-pay | Admitting: Oncology

## 2019-11-12 ENCOUNTER — Inpatient Hospital Stay: Payer: 59

## 2019-11-12 ENCOUNTER — Other Ambulatory Visit: Payer: Self-pay

## 2019-11-12 ENCOUNTER — Encounter: Payer: Self-pay | Admitting: Obstetrics and Gynecology

## 2019-11-12 ENCOUNTER — Ambulatory Visit (INDEPENDENT_AMBULATORY_CARE_PROVIDER_SITE_OTHER): Payer: 59 | Admitting: Obstetrics and Gynecology

## 2019-11-12 ENCOUNTER — Inpatient Hospital Stay (HOSPITAL_BASED_OUTPATIENT_CLINIC_OR_DEPARTMENT_OTHER): Payer: 59 | Admitting: Oncology

## 2019-11-12 ENCOUNTER — Inpatient Hospital Stay: Payer: 59 | Attending: Oncology

## 2019-11-12 ENCOUNTER — Other Ambulatory Visit (HOSPITAL_COMMUNITY)
Admission: RE | Admit: 2019-11-12 | Discharge: 2019-11-12 | Disposition: A | Discharge status: Home / Self Care | Payer: 59 | Source: Ambulatory Visit | Attending: Obstetrics and Gynecology | Admitting: Obstetrics and Gynecology

## 2019-11-12 VITALS — BP 149/85 | HR 93 | Ht 66.0 in | Wt 229.0 lb

## 2019-11-12 VITALS — BP 128/86 | HR 84 | Temp 98.4°F | Resp 16 | Wt 227.7 lb

## 2019-11-12 DIAGNOSIS — Z124 Encounter for screening for malignant neoplasm of cervix: Secondary | ICD-10-CM | POA: Insufficient documentation

## 2019-11-12 DIAGNOSIS — Z5112 Encounter for antineoplastic immunotherapy: Secondary | ICD-10-CM | POA: Insufficient documentation

## 2019-11-12 DIAGNOSIS — C50211 Malignant neoplasm of upper-inner quadrant of right female breast: Secondary | ICD-10-CM | POA: Diagnosis not present

## 2019-11-12 DIAGNOSIS — Z170 Estrogen receptor positive status [ER+]: Secondary | ICD-10-CM | POA: Insufficient documentation

## 2019-11-12 DIAGNOSIS — M8580 Other specified disorders of bone density and structure, unspecified site: Secondary | ICD-10-CM | POA: Diagnosis not present

## 2019-11-12 DIAGNOSIS — Z01419 Encounter for gynecological examination (general) (routine) without abnormal findings: Secondary | ICD-10-CM

## 2019-11-12 DIAGNOSIS — Z1239 Encounter for other screening for malignant neoplasm of breast: Secondary | ICD-10-CM

## 2019-11-12 DIAGNOSIS — Z95828 Presence of other vascular implants and grafts: Secondary | ICD-10-CM

## 2019-11-12 LAB — COMPREHENSIVE METABOLIC PANEL
ALT: 21 U/L (ref 0–44)
AST: 25 U/L (ref 15–41)
Albumin: 3.6 g/dL (ref 3.5–5.0)
Alkaline Phosphatase: 103 U/L (ref 38–126)
Anion gap: 8 (ref 5–15)
BUN: 18 mg/dL (ref 6–20)
CO2: 22 mmol/L (ref 22–32)
Calcium: 8.9 mg/dL (ref 8.9–10.3)
Chloride: 106 mmol/L (ref 98–111)
Creatinine, Ser: 0.69 mg/dL (ref 0.44–1.00)
GFR calc Af Amer: >60 mL/min (ref >60)
GFR calc non Af Amer: >60 mL/min (ref >60)
Glucose, Bld: 113 mg/dL — ABNORMAL HIGH (ref 70–99)
Potassium: 4 mmol/L (ref 3.5–5.1)
Sodium: 136 mmol/L (ref 135–145)
Total Bilirubin: 0.5 mg/dL (ref 0.3–1.2)
Total Protein: 7.6 g/dL (ref 6.5–8.1)

## 2019-11-12 LAB — CBC WITH DIFFERENTIAL/PLATELET
Abs Immature Granulocytes: 0.01 10*3/uL (ref 0.00–0.07)
Basophils Absolute: 0.1 10*3/uL (ref 0.0–0.1)
Basophils Relative: 2 %
Eosinophils Absolute: 0.2 10*3/uL (ref 0.0–0.5)
Eosinophils Relative: 3 %
HCT: 40.4 % (ref 36.0–46.0)
Hemoglobin: 13.4 g/dL (ref 12.0–15.0)
Immature Granulocytes: 0 %
Lymphocytes Relative: 30 %
Lymphs Abs: 1.8 10*3/uL (ref 0.7–4.0)
MCH: 29.8 pg (ref 26.0–34.0)
MCHC: 33.2 g/dL (ref 30.0–36.0)
MCV: 90 fL (ref 80.0–100.0)
Monocytes Absolute: 0.7 10*3/uL (ref 0.1–1.0)
Monocytes Relative: 12 %
Neutro Abs: 3.1 10*3/uL (ref 1.7–7.7)
Neutrophils Relative %: 53 %
Platelets: 390 10*3/uL (ref 150–400)
RBC: 4.49 MIL/uL (ref 3.87–5.11)
RDW: 13.5 % (ref 11.5–15.5)
WBC: 5.9 10*3/uL (ref 4.0–10.5)
nRBC: 0 % (ref 0.0–0.2)

## 2019-11-12 MED ORDER — HEPARIN SOD (PORK) LOCK FLUSH 100 UNIT/ML IV SOLN
500.0000 [IU] | Freq: Once | INTRAVENOUS | Status: AC | PRN
  Administered 2019-11-12: 12:00:00 500 [IU]
  Filled 2019-11-12: qty 5

## 2019-11-12 MED ORDER — SODIUM CHLORIDE 0.9% FLUSH
10.0000 mL | Freq: Once | INTRAVENOUS | Status: AC
  Administered 2019-11-12: 10 mL via INTRAVENOUS
  Filled 2019-11-12: qty 10

## 2019-11-12 MED ORDER — TRASTUZUMAB-ANNS CHEMO 150 MG IV SOLR
600.0000 mg | Freq: Once | INTRAVENOUS | Status: AC
  Administered 2019-11-12: 11:00:00 600 mg via INTRAVENOUS
  Filled 2019-11-12: qty 28.57

## 2019-11-12 MED ORDER — SODIUM CHLORIDE 0.9 % IV SOLN
Freq: Once | INTRAVENOUS | Status: AC
  Filled 2019-11-12: qty 250

## 2019-11-12 MED ORDER — HEPARIN SOD (PORK) LOCK FLUSH 100 UNIT/ML IV SOLN
INTRAVENOUS | Status: AC
  Filled 2019-11-12: qty 5

## 2019-11-12 MED ORDER — DIPHENHYDRAMINE HCL 25 MG PO CAPS
25.0000 mg | ORAL_CAPSULE | Freq: Once | ORAL | Status: AC
  Administered 2019-11-12: 25 mg via ORAL
  Filled 2019-11-12: qty 1

## 2019-11-12 MED ORDER — ACETAMINOPHEN 325 MG PO TABS
650.0000 mg | ORAL_TABLET | Freq: Once | ORAL | Status: AC
  Administered 2019-11-12: 650 mg via ORAL
  Filled 2019-11-12: qty 2

## 2019-11-12 NOTE — Progress Notes (Signed)
Gynecology Annual Exam  PCP: Perrin Maltese, MD  Chief Complaint:  Chief Complaint  Patient presents with  . Gynecologic Exam    Had Chemo treatment today    History of Present Illness:Patient is a 59 y.o. [id number] presents for annual exam. The patient has no complaints today.   LMP: No LMP recorded. Patient is postmenopausal. No PMB  The patient is not currently sexually active.  The patient does perform self breast exams.  There is notable personal history of breast cancer.  Patient has a history of IA ER/PR positive, HER-2 positive invasive carcinoma of the right breast.  The patient is currently on an aromatase inhibitory (anastrazole) as well as trastuzumab.   The patient wears seatbelts: yes.   The patient has regular exercise: not asked.    The patient denies current symptoms of depression.     Review of Systems: ROS  Past Medical History:  Patient Active Problem List   Diagnosis Date Noted  . Malignant neoplasm of upper-inner quadrant of right breast in female, estrogen receptor positive ([place]) 09/05/2018  . Menopause 10/23/2017  . Vaginal odor 10/23/2017  . MVP (mitral valve prolapse) 02/13/2014    Overview:  a. 11/18/08 Echocardiogram: EF greater than 55%, mild LVH with Grade I diastolic dysfunction. Mitral valve with mild prolapse with trivial  regurgitation.   Overview:  a. 11/18/08 Echocardiogram: EF greater than 55%, mild LVH with Grade I diastolic dysfunction. Mitral valve with mild prolapse with trivial  regurgitation.    . Chest pain 02/03/2014  . Coronary artery disease 02/03/2014    Overview:  A.  10/2013: Myocardial Perfusion Scan: 6 minutes, EF 70%, Mild anterior wall reversible defect B.  12/2013: ECHO: EF 67%, Mild MR B. 01/2014: CTA Cardiac: Calcium Score 720.7, moderate mid LAD calcified lesion, mild LCX.   C. 01/2014: Cardiac Cath: [place]: EF 60%, LM 20%, LAD 50%, mid; LCX 30%, RCA minor irregularities  Overview:  A.  10/2013: Myocardial  Perfusion Scan: 6 minutes, EF 70%, Mild anterior wall reversible defect B.  12/2013: ECHO: EF 67%, Mild MR B. 01/2014: CTA Cardiac: Calcium Score 720.7, moderate mid LAD calcified lesion, mild LCX.   C. 01/2014: Cardiac Cath: [place]: EF 60%, LM 20%, LAD 50%, mid; LCX 30%, RCA minor irregularities   . Essential hypertension 02/03/2014  . Hyperlipidemia 02/03/2014    Overview:  A. 2010: LP(a) 89 B. 2010: NMR Profile: TC 206, tg 76, HDL 52, LDL 139, LDL P 1073, size 22.1, large, Small LDL P 241 C. 07/2013: TC 188, Tg 105, HDL 51, LDL 116  Overview:  A. 2010: LP(a) 89 B. 2010: NMR Profile: TC 206, tg 76, HDL 52, LDL 139, LDL P 1073, size 22.1, large, Small LDL P 241 C. 07/2013: TC 188, Tg 105, HDL 51, LDL 116   . Myalgia 02/03/2014  . Cardiac function test normal 02/03/2014  . Normal cardiac stress test 02/03/2014    Past Surgical History:  Past Surgical History:  Procedure Laterality Date  . BREAST BIOPSY Right 08/27/2018   [place] bx, IMC  . BREAST LUMPECTOMY Right 09/11/2018  . BREAST LUMPECTOMY WITH NEEDLE LOCALIZATION AND AXILLARY SENTINEL LYMPH NODE BX Right 09/11/2018   Procedure: BREAST LUMPECTOMY WITH NEEDLE LOCALIZATION AND SENTINEL LYMPH NODE BX;  Surgeon: [name], MD;  Location: [facility];  Service: General;  Laterality: Right;  . CARDIAC CATHETERIZATION    . CATARACT EXTRACTION W/PHACO Left 01/01/2017   Procedure: CATARACT EXTRACTION PHACO AND INTRAOCULAR LENS PLACEMENT (IOC)left  Restor lens;  Surgeon: Leandrew Koyanagi, MD;  Location: [place];  Service: Ophthalmology;  Laterality: Left;  Restor lens  . CATARACT EXTRACTION W/PHACO Right 01/31/2017   Procedure: CATARACT EXTRACTION PHACO AND INTRAOCULAR LENS PLACEMENT (IOC);  Surgeon: Leandrew Koyanagi, MD;  Location: [facility];  Service: Ophthalmology;  Laterality: Right;  . CESAREAN SECTION    . CHOLECYSTECTOMY    . HYSTEROSCOPY  06/2010   D&C  . PORTACATH PLACEMENT Right 09/11/2018    Procedure: INSERTION PORT-A-CATH;  Surgeon: [name], MD;  Location: [facility];  Service: General;  Laterality: Right;  . TUBAL LIGATION      Gynecologic History:  No LMP recorded. Patient is postmenopausal.  Obstetric History: [id number]  Family History:  Family History  Problem Relation Age of Onset  . Hypertension Mother   . Cancer Mother 59       LUNG  . Hypertension Father   . Colon cancer Paternal Uncle   . Breast cancer Neg Hx     Social History:  Social History   Socioeconomic History  . Marital status: Widowed    Spouse name: Not on file  . Number of children: 3  . Years of education: 69  . Highest education level: Not on file  Occupational History  . Occupation: SELF EMPLOYED  Tobacco Use  . Smoking status: Never Smoker  . Smokeless tobacco: Never Used  Substance and Sexual Activity  . Alcohol use: Yes    Alcohol/week: 3.0 standard drinks    Types: 3 Shots of liquor per week    Comment: social drinker  . Drug use: No  . Sexual activity: Yes    Birth control/protection: Post-menopausal  Other Topics Concern  . Not on file  Social History Narrative  . Not on file   Social Determinants of Health   Financial Resource Strain:   . Difficulty of Paying Living Expenses:   Food Insecurity:   . Worried About Charity fundraiser in the Last Year:   . Arboriculturist in the Last Year:   Transportation Needs:   . Film/video editor (Medical):   Marland Kitchen Lack of Transportation (Non-Medical):   Physical Activity:   . Days of Exercise per Week:   . Minutes of Exercise per Session:   Stress:   . Feeling of Stress :   Social Connections:   . Frequency of Communication with Friends and Family:   . Frequency of Social Gatherings with Friends and Family:   . Attends Religious Services:   . Active Member of Clubs or Organizations:   . Attends Archivist Meetings:   Marland Kitchen Marital Status:   Intimate Partner Violence:   . Fear of Current or Ex-Partner:     . Emotionally Abused:   Marland Kitchen Physically Abused:   . Sexually Abused:     Allergies:  Allergies  Allergen Reactions  . Isosorbide Nitrate Other (See Comments)    headache    Medications: Prior to Admission medications   Medication Sig Start Date End Date Taking? Authorizing Provider  amLODipine-benazepril (LOTREL) 10-20 MG per capsule Take 1 capsule by mouth daily.  04/27/14   [provider]  anastrozole (ARIMIDEX) 1 MG tablet Take 1 tablet (1 mg total) by mouth daily. 05/22/19   Lloyd Huger, MD  diclofenac (VOLTAREN) 75 MG EC tablet Take 1 tablet (75 mg total) by mouth 2 (two) times daily. 04/10/17   Edrick Kins, DPM  fluticasone (FLONASE) 50 MCG/ACT nasal spray  Place 2 sprays into both nostrils daily as needed for allergies.  03/02/14   [provider]  hydrochlorothiazide (MICROZIDE) 12.5 MG capsule Take 12.5 mg by mouth daily as needed (vertigo).     [provider]  lidocaine-prilocaine (EMLA) cream Apply to affected area once 09/30/18   Lloyd Huger, MD  LORazepam (ATIVAN) 0.5 MG tablet Take 0.5 mg by mouth as needed.  09/03/18   [provider]  meclizine (ANTIVERT) 25 MG tablet Take 25 mg by mouth 3 (three) times daily as needed (Vertigo).  02/14/14   [provider]  omeprazole (PRILOSEC) 40 MG capsule Take 40 mg by mouth daily. 08/04/19   [provider]  venlafaxine XR (EFFEXOR-XR) 75 MG 24 hr capsule Take 75 mg by mouth daily with breakfast.     [provider]    Physical Exam Vitals: Blood pressure (!) 149/85, pulse 93, height '5\' 6"'$  (1.676 m), weight 229 lb (103.9 kg).   General: NAD HEENT: normocephalic, anicteric Thyroid: no enlargement, no palpable nodules Pulmonary: No increased work of breathing, CTAB Cardiovascular: RRR, distal pulses 2+ Breast: Breast with post surgical and radiation related changes in the right breast, no tenderness, no palpable nodules or masses other than previously noted  radiation changes extending from the right nipple to the upper inner quadrant, no skin or nipple retraction present, no nipple discharge.  No axillary or supraclavicular lymphadenopathy. Abdomen: NABS, soft, non-tender, non-distended.  Umbilicus without lesions.  No hepatomegaly, splenomegaly or masses palpable. No evidence of hernia  Genitourinary:  External: Normal external female genitalia.  Normal urethral meatus, normal Bartholin's and Skene's glands.    Vagina: Normal vaginal mucosa, no evidence of prolapse.    Cervix: Grossly normal in appearance, no bleeding  Uterus: Non-enlarged, mobile, normal contour.  No CMT  Adnexa: ovaries non-enlarged, no adnexal masses  Rectal: deferred  Lymphatic: no evidence of inguinal lymphadenopathy Extremities: no edema, erythema, or tenderness Neurologic: Grossly intact Psychiatric: mood appropriate, affect full  Female chaperone present for pelvic and breast  portions of the physical exam     Assessment: 59 y.o. G3P2013 routine annual exam  Plan: Problem List Items Addressed This Visit    None    Visit Diagnoses    Encounter for gynecological examination without abnormal finding    -  Primary   Screening for malignant neoplasm of cervix       Relevant Orders   Cytology - PAP   Breast screening          1) Mammogram - being followed by oncology  2) STI screening  was notoffered and therefore not obtained  3) ASCCP guidelines and rational discussed.  Patient opts for every 3 years screening interval  4) Osteoporosis  - per USPTF routine screening DEXA at age 78 - patient with DEXA 04/01/2019 major fracture risk 6.0% and hip fracture 0.2% over the next 10 years.  Dual femor was normal and some mild osteopenia of the spine  5) Routine healthcare maintenance including cholesterol, diabetes screening discussed managed by PCP  6) Colonoscopy UTD managed by PCP  7) Return in about 1 year (around 11/11/2020) for Annual exam.    Malachy Mood, MD [facility] Pige[facility], [facility] 11/12/2019, 1:54 PM

## 2019-11-12 NOTE — Progress Notes (Signed)
Pt here for follow up. No complaints or concerns. Reports feeling well.

## 2019-11-17 LAB — CYTOLOGY - PAP
Adequacy: ABSENT
Comment: NEGATIVE
High risk HPV: NEGATIVE

## 2019-11-25 NOTE — Progress Notes (Signed)

## 2019-11-27 ENCOUNTER — Ambulatory Visit: Payer: 59 | Attending: Internal Medicine

## 2019-12-03 ENCOUNTER — Other Ambulatory Visit: Payer: Self-pay

## 2019-12-03 ENCOUNTER — Other Ambulatory Visit: Payer: Self-pay | Admitting: Oncology

## 2019-12-03 ENCOUNTER — Inpatient Hospital Stay: Payer: 59 | Attending: Oncology

## 2019-12-03 VITALS — BP 144/87 | HR 84 | Temp 97.3°F | Resp 17 | Wt 226.4 lb

## 2019-12-03 DIAGNOSIS — M8580 Other specified disorders of bone density and structure, unspecified site: Secondary | ICD-10-CM | POA: Diagnosis not present

## 2019-12-03 DIAGNOSIS — Z5112 Encounter for antineoplastic immunotherapy: Secondary | ICD-10-CM | POA: Insufficient documentation

## 2019-12-03 DIAGNOSIS — Z170 Estrogen receptor positive status [ER+]: Secondary | ICD-10-CM | POA: Diagnosis not present

## 2019-12-03 DIAGNOSIS — C50211 Malignant neoplasm of upper-inner quadrant of right female breast: Secondary | ICD-10-CM | POA: Diagnosis present

## 2019-12-03 DIAGNOSIS — N6452 Nipple discharge: Secondary | ICD-10-CM

## 2019-12-03 MED ORDER — HEPARIN SOD (PORK) LOCK FLUSH 100 UNIT/ML IV SOLN
500.0000 [IU] | Freq: Once | INTRAVENOUS | Status: AC | PRN
  Administered 2019-12-03: 500 [IU]
  Filled 2019-12-03: qty 5

## 2019-12-03 MED ORDER — SODIUM CHLORIDE 0.9 % IV SOLN
Freq: Once | INTRAVENOUS | Status: AC
  Filled 2019-12-03: qty 250

## 2019-12-03 MED ORDER — DIPHENHYDRAMINE HCL 25 MG PO CAPS
25.0000 mg | ORAL_CAPSULE | Freq: Once | ORAL | Status: AC
  Administered 2019-12-03: 25 mg via ORAL
  Filled 2019-12-03: qty 1

## 2019-12-03 MED ORDER — ACETAMINOPHEN 325 MG PO TABS
650.0000 mg | ORAL_TABLET | Freq: Once | ORAL | Status: AC
  Administered 2019-12-03: 650 mg via ORAL
  Filled 2019-12-03: qty 2

## 2019-12-03 MED ORDER — TRASTUZUMAB-ANNS CHEMO 150 MG IV SOLR
600.0000 mg | Freq: Once | INTRAVENOUS | Status: AC
  Administered 2019-12-03: 600 mg via INTRAVENOUS
  Filled 2019-12-03: qty 28.57

## 2019-12-03 NOTE — Progress Notes (Signed)
Re: Right nipple discharge  Patient presents for treatment today and mentions to infusion nurse that she had right nipple discharge last night.  Patient woke up this morning and noted a "wet spot" on her sheet beneath her breast.  It appeared pink in color.  She was evaluated a few weeks ago for same concern.  Dr. Grayland Ormond was consulted who recommended a mammogram and ultrasound if symptoms persisted.  Orders placed.  Patient is aware.  Faythe Casa, NP 12/03/2019 11:46 AM

## 2019-12-03 NOTE — Progress Notes (Signed)
Pt reports that last week, she had a moderate amount of "yellowish with a red blood tint" from her right breast, she believes it is coming from her nipple. Pt states that the knot that is present has been there but it has become more painful. Dr. Grayland Ormond aware. Per Dr. Grayland Ormond, pt to have a mammogram and ultrasound done. Rulon Abide NP at chairside and discussed plan with pt, Per Rulon Abide NP okay to proceed with treatment at this time.

## 2019-12-06 ENCOUNTER — Ambulatory Visit: Payer: 59

## 2019-12-09 ENCOUNTER — Encounter: Payer: Self-pay | Admitting: Oncology

## 2019-12-12 ENCOUNTER — Ambulatory Visit
Admission: RE | Admit: 2019-12-12 | Discharge: 2019-12-12 | Disposition: A | Discharge status: Home / Self Care | Payer: 59 | Source: Ambulatory Visit | Attending: Oncology | Admitting: Oncology

## 2019-12-12 DIAGNOSIS — N6452 Nipple discharge: Secondary | ICD-10-CM

## 2019-12-13 ENCOUNTER — Ambulatory Visit: Payer: 59 | Attending: Internal Medicine

## 2019-12-13 DIAGNOSIS — Z23 Encounter for immunization: Secondary | ICD-10-CM

## 2019-12-13 NOTE — Progress Notes (Signed)
   Covid-19 Vaccination Clinic  Name:  Erin Church    MRN: [id number] DOB: Dec 06, 1960  12/13/2019  [name] was observed post Covid-19 immunization for 15 minutes without incident. She was provided with Vaccine Information Sheet and instruction to access the V-Safe system.   [name] was instructed to call 911 with any severe reactions post vaccine: Marland Kitchen Difficulty breathing  . Swelling of face and throat  . A fast heartbeat  . A bad rash all over body  . Dizziness and weakness   Immunizations Administered    Name Date Dose VIS Date Route   Pfizer COVID-19 Vaccine 12/13/2019  9:54 AM 0.3 mL 08/08/2019 Intramuscular   Manufacturer: [place]   Lot: [id number]   [place]: [id number]

## 2019-12-15 ENCOUNTER — Other Ambulatory Visit: Payer: Self-pay | Admitting: Oncology

## 2019-12-15 DIAGNOSIS — N6452 Nipple discharge: Secondary | ICD-10-CM

## 2019-12-15 NOTE — Progress Notes (Signed)
Re: Nipple Discharge  Mammogram of right breast shows stable seroma but recommends MRI for further evaluation of nipple discharge.  Orders placed.  Patient to be scheduled in the next week.   Faythe Casa, NP 12/15/2019 9:02 AM

## 2019-12-17 NOTE — Progress Notes (Signed)

## 2019-12-20 NOTE — Progress Notes (Signed)
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  INTERVAL HISTORY: Patient returns to clinic today for further evaluation and her final infusion of maintenance Kanjinti.  She has had recurrent nipple discharge, mammogram and ultrasound were unrevealing.  Breast MRI is scheduled for later this week.  She otherwise feels well and is asymptomatic. She continues to tolerate her treatments without significant side effects.  She is tolerating anastrozole. She has no neurologic complaints.  She denies any recent fevers or illnesses.  She has a good appetite and denies weight loss.  She denies any chest pain, shortness of breath, cough, or hemoptysis.  She denies any nausea, vomiting, constipation, or diarrhea.  She has no urinary complaints.  Patient offers no further specific complaints today.  REVIEW OF SYSTEMS:   Review of Systems  Constitutional: Negative.  Negative for fever, malaise/fatigue and weight loss.  Respiratory: Negative.  Negative for cough, hemoptysis and shortness of breath.   Cardiovascular: Negative.  Negative for chest pain and leg swelling.  Gastrointestinal: Negative.  Negative for abdominal pain, blood in stool and melena.  Genitourinary: Negative.  Negative for dysuria.  Musculoskeletal: Negative.  Negative for back pain.  Skin: Negative.  Negative for rash.  Neurological: Negative.  Negative for seizures, weakness and headaches.  Psychiatric/Behavioral: Negative.  The patient is not nervous/anxious.     As per HPI. Otherwise, a complete review of systems is negative.  PAST MEDICAL HISTORY: Past Medical  History:  Diagnosis Date  . Anginal pain ([place])   . Arthritis   . Breast cancer ([place])   . Carpal tunnel syndrome of right wrist   . Coronary atherosclerosis of unspecified type of vessel, native or graft   . Hand and foot pain   . HER2-positive carcinoma of breast ([place])   . Hyperlipidemia   . Hypertension   . MVP (mitral valve prolapse)   . Personal history of chemotherapy   . Personal history of radiation therapy   . Sleep apnea   . Vertigo     PAST SURGICAL HISTORY: Past Surgical History:  Procedure Laterality Date  . BREAST BIOPSY Right 08/27/2018   [place] bx, IMC  . BREAST LUMPECTOMY Right 09/11/2018  . BREAST LUMPECTOMY WITH NEEDLE LOCALIZATION AND AXILLARY SENTINEL LYMPH NODE BX Right 09/11/2018   Procedure: BREAST LUMPECTOMY WITH NEEDLE LOCALIZATION AND SENTINEL LYMPH NODE BX;  Surgeon: [name], MD;  Location: [facility];  Service: General;  Laterality: Right;  . CARDIAC CATHETERIZATION    . CATARACT EXTRACTION W/PHACO Left 01/01/2017   Procedure: CATARACT EXTRACTION PHACO AND INTRAOCULAR LENS PLACEMENT (IOC)left Restor lens;  Surgeon: Leandrew Koyanagi, MD;  Location: [place];  Service: Ophthalmology;  Laterality: Left;  Restor lens  . CATARACT EXTRACTION W/PHACO Right 01/31/2017   Procedure: CATARACT EXTRACTION PHACO AND INTRAOCULAR LENS PLACEMENT (IOC);  Surgeon: Leandrew Koyanagi, MD;  Location: [facility];  Service: Ophthalmology;  Laterality: Right;  . CESAREAN SECTION    . CHOLECYSTECTOMY    . HYSTEROSCOPY  06/2010   D&C  . PORTACATH PLACEMENT Right 09/11/2018   Procedure: INSERTION PORT-A-CATH;  Surgeon: [name], MD;  Location: [facility];  Service: General;  Laterality: Right;  .  TUBAL LIGATION      FAMILY HISTORY: Family History  Problem Relation Age of Onset  . Hypertension Mother   . Cancer Mother 47       LUNG  . Hypertension Father   . Colon cancer Paternal Uncle   . Breast cancer Neg Hx     ADVANCED DIRECTIVES  (Y/N):  N  HEALTH MAINTENANCE: Social History   Tobacco Use  . Smoking status: Never Smoker  . Smokeless tobacco: Never Used  Substance Use Topics  . Alcohol use: Yes    Alcohol/week: 3.0 standard drinks    Types: 3 Shots of liquor per week    Comment: social drinker  . Drug use: No     Colonoscopy:  PAP:  Bone density:  Lipid panel:  Allergies  Allergen Reactions  . Isosorbide Nitrate Other (See Comments)    headache    Current Outpatient Medications  Medication Sig Dispense Refill  . amLODipine-benazepril (LOTREL) 10-20 MG per capsule Take 1 capsule by mouth daily.     Marland Kitchen anastrozole (ARIMIDEX) 1 MG tablet Take 1 tablet (1 mg total) by mouth daily. 90 tablet 3  . diclofenac (VOLTAREN) 75 MG EC tablet Take 1 tablet (75 mg total) by mouth 2 (two) times daily. 60 tablet 0  . fluticasone (FLONASE) 50 MCG/ACT nasal spray Place 2 sprays into both nostrils daily as needed for allergies.     Marland Kitchen lidocaine-prilocaine (EMLA) cream Apply to affected area once 30 g 3  . LIVALO 2 MG TABS Take 1 tablet by mouth daily.    Marland Kitchen LORazepam (ATIVAN) 0.5 MG tablet Take 0.5 mg by mouth as needed.     . meclizine (ANTIVERT) 25 MG tablet Take 25 mg by mouth 3 (three) times daily as needed (Vertigo).     Marland Kitchen omeprazole (PRILOSEC) 40 MG capsule Take 40 mg by mouth daily.    Marland Kitchen venlafaxine XR (EFFEXOR-XR) 75 MG 24 hr capsule Take 75 mg by mouth daily with breakfast.      Current Facility-Administered Medications  Medication Dose Route Frequency Provider Last Rate Last Admin  . betamethasone acetate-betamethasone sodium phosphate (CELESTONE) injection 3 mg  3 mg Intramuscular Once Edrick Kins, DPM        OBJECTIVE: Vitals:   12/24/19 1009  BP: 129/87  Pulse: 88  Resp: 20  Temp: (!) 97.3 F (36.3 C)  SpO2: 100%     Body mass index is 36.17 kg/m.    ECOG FS:0 - Asymptomatic  General: Well-developed, well-nourished, no acute distress. Eyes: Pink conjunctiva, anicteric sclera. HEENT:  Normocephalic, moist mucous membranes. Lungs: No audible wheezing or coughing. Heart: Regular rate and rhythm. Abdomen: Soft, nontender, no obvious distention. Musculoskeletal: No edema, cyanosis, or clubbing. Neuro: Alert, answering all questions appropriately. Cranial nerves grossly intact. Skin: No rashes or petechiae noted. Psych: Normal affect.  LAB RESULTS:  Lab Results  Component Value Date   NA 137 12/24/2019   K 3.8 12/24/2019   CL 103 12/24/2019   CO2 23 12/24/2019   GLUCOSE 137 (H) 12/24/2019   BUN 16 12/24/2019   CREATININE 0.82 12/24/2019   CALCIUM 8.9 12/24/2019   PROT 7.8 12/24/2019   ALBUMIN 3.5 12/24/2019   AST 26 12/24/2019   ALT 26 12/24/2019   ALKPHOS 109 12/24/2019   BILITOT 0.3 12/24/2019   GFRNONAA >60 12/24/2019   GFRAA >60 12/24/2019    Lab Results  Component Value Date   WBC 7.7 12/24/2019   NEUTROABS 4.6 12/24/2019   HGB 14.0  12/24/2019   HCT 41.6 12/24/2019   MCV 88.3 12/24/2019   PLT 407 (H) 12/24/2019     STUDIES: US Breast Limited Uni Right Inc Axilla  Result Date: 12/12/2019 CLINICAL DATA:  Yellow and brown right nipple discharge. Patient has a history right breast cancer status post lumpectomy January of 2020 with known seroma at post surgical site. EXAM: DIGITAL DIAGNOSTIC right MAMMOGRAM WITH CAD AND TOMO ULTRASOUND right BREAST COMPARISON:  Prior films ACR Breast Density Category b: There are scattered areas of fibroglandular density. FINDINGS: Cc and MLO views of the right breast, spot tangential view of right breast are submitted. Stable postsurgical seroma is identified in the right breast. No suspicious abnormality is identified. Mammographic images were processed with CAD. Targeted ultrasound is performed, showing stable seroma at the right breast 1 o'clock 2 cm from nipple area unchanged compared to prior ultrasound. Ultrasound the retroareolar right breast demonstrate no focal intraductal abnormality identified. IMPRESSION:  Benign findings. RECOMMENDATION: Recommend MRI of bilateral breasts for further evaluation of right nipple discharge. I have discussed the findings and recommendations with the patient. If applicable, a reminder letter will be sent to the patient regarding the next appointment. BI-RADS CATEGORY  2: Benign. Electronically Signed   By: Abelardo Diesel M.D.   On: 12/12/2019 13:34   MM DIAG BREAST TOMO UNI RIGHT  Result Date: 12/12/2019 CLINICAL DATA:  Yellow and brown right nipple discharge. Patient has a history right breast cancer status post lumpectomy January of 2020 with known seroma at post surgical site. EXAM: DIGITAL DIAGNOSTIC right MAMMOGRAM WITH CAD AND TOMO ULTRASOUND right BREAST COMPARISON:  Prior films ACR Breast Density Category b: There are scattered areas of fibroglandular density. FINDINGS: Cc and MLO views of the right breast, spot tangential view of right breast are submitted. Stable postsurgical seroma is identified in the right breast. No suspicious abnormality is identified. Mammographic images were processed with CAD. Targeted ultrasound is performed, showing stable seroma at the right breast 1 o'clock 2 cm from nipple area unchanged compared to prior ultrasound. Ultrasound the retroareolar right breast demonstrate no focal intraductal abnormality identified. IMPRESSION: Benign findings. RECOMMENDATION: Recommend MRI of bilateral breasts for further evaluation of right nipple discharge. I have discussed the findings and recommendations with the patient. If applicable, a reminder letter will be sent to the patient regarding the next appointment. BI-RADS CATEGORY  2: Benign. Electronically Signed   By: Abelardo Diesel M.D.   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 Clinical stage IA ER/PR positive, HER-2 positive invasive carcinoma of the upper inner quadrant of the  right breast: Patient had a lumpectomy on September 11, 2018 confirming stage of disease.  She also had port placement at the same time.  She completed 12 weekly cycles of Herceptin plus Taxol on December 26, 2018.  Patient initiated maintenance Kanjinti (bio similar to Herceptin) on Jan 16, 2019.   She has now completed XRT.  Patient has significant side effects of letrozole, therefore has been switched to anastrozole.  She will be required to take a total of 5 years of treatment completing in July 2025.  Patient's most recent MUGA on October 08, 2019 revealed an ejection fraction of 54.7% which is essentially unchanged from previous.  Right breast mammogram on December 12, 2019 was reported as BI-RADS 2.  Proceed with her next infusion of maintenance Kanjinti  today.  Patient has now completed her maintenance treatment.  Return to clinic in 3 months for routine evaluation. 2.  Osteopenia: Patient had a baseline bone mineral density on April 01, 2019 that revealed a T score of -1.6.  Have recommended calcium and vitamin D supplementation.  Repeat in August 2021. 3.  Hot flashes/joint pain: Resolved since discontinuing letrozole.  Anastrozole as above. 4.  Nipple discharge: Mammogram and ultrasound are negative as above.  Patient has an MRI scheduled for later this week and then will follow up with surgery several days later.  I spent a total of 30 minutes reviewing chart data, face-to-face evaluation with the patient, counseling and coordination of care as detailed above.    Patient expressed understanding and was in agreement with this plan. She also understands that She can call clinic at any time with any questions, concerns, or complaints.   Cancer Staging Malignant neoplasm of upper-inner quadrant of right breast in female, estrogen receptor positive ([place]) Staging form: Breast, AJCC 8th Edition - Clinical stage from 09/06/2018: Stage IA (cT1c, cN0, cM0, G3, ER+, PR+, HER2+) - Signed by Lloyd Huger, MD on 09/06/2018   Lloyd Huger, MD   12/25/2019 6:53 AM

## 2019-12-23 ENCOUNTER — Encounter: Payer: Self-pay | Admitting: Oncology

## 2019-12-24 ENCOUNTER — Inpatient Hospital Stay (HOSPITAL_BASED_OUTPATIENT_CLINIC_OR_DEPARTMENT_OTHER): Payer: 59 | Admitting: Oncology

## 2019-12-24 ENCOUNTER — Other Ambulatory Visit: Payer: Self-pay

## 2019-12-24 ENCOUNTER — Encounter: Payer: Self-pay | Admitting: Oncology

## 2019-12-24 ENCOUNTER — Inpatient Hospital Stay: Payer: 59

## 2019-12-24 VITALS — BP 129/87 | HR 88 | Temp 97.3°F | Resp 20 | Wt 224.1 lb

## 2019-12-24 DIAGNOSIS — Z170 Estrogen receptor positive status [ER+]: Secondary | ICD-10-CM | POA: Diagnosis not present

## 2019-12-24 DIAGNOSIS — C50211 Malignant neoplasm of upper-inner quadrant of right female breast: Secondary | ICD-10-CM

## 2019-12-24 DIAGNOSIS — Z5112 Encounter for antineoplastic immunotherapy: Secondary | ICD-10-CM | POA: Diagnosis not present

## 2019-12-24 LAB — CBC WITH DIFFERENTIAL/PLATELET
Abs Immature Granulocytes: 0.03 10*3/uL (ref 0.00–0.07)
Basophils Absolute: 0.1 10*3/uL (ref 0.0–0.1)
Basophils Relative: 1 %
Eosinophils Absolute: 0.2 10*3/uL (ref 0.0–0.5)
Eosinophils Relative: 2 %
HCT: 41.6 % (ref 36.0–46.0)
Hemoglobin: 14 g/dL (ref 12.0–15.0)
Immature Granulocytes: 0 %
Lymphocytes Relative: 27 %
Lymphs Abs: 2.1 10*3/uL (ref 0.7–4.0)
MCH: 29.7 pg (ref 26.0–34.0)
MCHC: 33.7 g/dL (ref 30.0–36.0)
MCV: 88.3 fL (ref 80.0–100.0)
Monocytes Absolute: 0.7 10*3/uL (ref 0.1–1.0)
Monocytes Relative: 10 %
Neutro Abs: 4.6 10*3/uL (ref 1.7–7.7)
Neutrophils Relative %: 60 %
Platelets: 407 10*3/uL — ABNORMAL HIGH (ref 150–400)
RBC: 4.71 MIL/uL (ref 3.87–5.11)
RDW: 13.1 % (ref 11.5–15.5)
WBC: 7.7 10*3/uL (ref 4.0–10.5)
nRBC: 0 % (ref 0.0–0.2)

## 2019-12-24 LAB — COMPREHENSIVE METABOLIC PANEL
ALT: 26 U/L (ref 0–44)
AST: 26 U/L (ref 15–41)
Albumin: 3.5 g/dL (ref 3.5–5.0)
Alkaline Phosphatase: 109 U/L (ref 38–126)
Anion gap: 11 (ref 5–15)
BUN: 16 mg/dL (ref 6–20)
CO2: 23 mmol/L (ref 22–32)
Calcium: 8.9 mg/dL (ref 8.9–10.3)
Chloride: 103 mmol/L (ref 98–111)
Creatinine, Ser: 0.82 mg/dL (ref 0.44–1.00)
GFR calc Af Amer: >60 mL/min (ref >60)
GFR calc non Af Amer: >60 mL/min (ref >60)
Glucose, Bld: 137 mg/dL — ABNORMAL HIGH (ref 70–99)
Potassium: 3.8 mmol/L (ref 3.5–5.1)
Sodium: 137 mmol/L (ref 135–145)
Total Bilirubin: 0.3 mg/dL (ref 0.3–1.2)
Total Protein: 7.8 g/dL (ref 6.5–8.1)

## 2019-12-24 MED ORDER — HEPARIN SOD (PORK) LOCK FLUSH 100 UNIT/ML IV SOLN
INTRAVENOUS | Status: AC
  Filled 2019-12-24: qty 5

## 2019-12-24 MED ORDER — HEPARIN SOD (PORK) LOCK FLUSH 100 UNIT/ML IV SOLN
500.0000 [IU] | Freq: Once | INTRAVENOUS | Status: DC | PRN
  Filled 2019-12-24: qty 5

## 2019-12-24 MED ORDER — HEPARIN SOD (PORK) LOCK FLUSH 100 UNIT/ML IV SOLN
500.0000 [IU] | Freq: Once | INTRAVENOUS | Status: AC
  Administered 2019-12-24: 500 [IU] via INTRAVENOUS
  Filled 2019-12-24: qty 5

## 2019-12-24 MED ORDER — DIPHENHYDRAMINE HCL 25 MG PO CAPS
25.0000 mg | ORAL_CAPSULE | Freq: Once | ORAL | Status: AC
  Administered 2019-12-24: 25 mg via ORAL
  Filled 2019-12-24: qty 1

## 2019-12-24 MED ORDER — TRASTUZUMAB-ANNS CHEMO 150 MG IV SOLR
600.0000 mg | Freq: Once | INTRAVENOUS | Status: AC
  Administered 2019-12-24: 600 mg via INTRAVENOUS
  Filled 2019-12-24: qty 28.57

## 2019-12-24 MED ORDER — SODIUM CHLORIDE 0.9 % IV SOLN
Freq: Once | INTRAVENOUS | Status: AC
  Filled 2019-12-24: qty 250

## 2019-12-24 MED ORDER — SODIUM CHLORIDE 0.9% FLUSH
10.0000 mL | INTRAVENOUS | Status: DC | PRN
  Administered 2019-12-24: 10 mL via INTRAVENOUS
  Filled 2019-12-24: qty 10

## 2019-12-24 MED ORDER — ACETAMINOPHEN 325 MG PO TABS
650.0000 mg | ORAL_TABLET | Freq: Once | ORAL | Status: AC
  Administered 2019-12-24: 650 mg via ORAL
  Filled 2019-12-24: qty 2

## 2019-12-29 ENCOUNTER — Ambulatory Visit
Admission: RE | Admit: 2019-12-29 | Discharge: 2019-12-29 | Disposition: A | Discharge status: Home / Self Care | Payer: 59 | Source: Ambulatory Visit | Attending: Oncology | Admitting: Oncology

## 2019-12-29 ENCOUNTER — Ambulatory Visit: Payer: 59

## 2019-12-29 ENCOUNTER — Other Ambulatory Visit: Payer: Self-pay

## 2019-12-29 DIAGNOSIS — N6452 Nipple discharge: Secondary | ICD-10-CM | POA: Insufficient documentation

## 2019-12-29 MED ORDER — GADOBUTROL 1 MMOL/ML IV SOLN
10.0000 mL | Freq: Once | INTRAVENOUS | Status: AC | PRN
  Administered 2019-12-29: 10 mL via INTRAVENOUS

## 2019-12-29 NOTE — Progress Notes (Signed)
Her MRI showed seroma as expected.   Faythe Casa, NP 12/29/2019 4:05 PM

## 2019-12-31 ENCOUNTER — Ambulatory Visit: Payer: 59

## 2020-01-06 ENCOUNTER — Encounter: Payer: Self-pay | Admitting: General Surgery

## 2020-01-06 ENCOUNTER — Other Ambulatory Visit: Payer: Self-pay

## 2020-01-06 ENCOUNTER — Ambulatory Visit (INDEPENDENT_AMBULATORY_CARE_PROVIDER_SITE_OTHER): Payer: 59 | Admitting: General Surgery

## 2020-01-06 VITALS — BP 159/84 | HR 88 | Temp 95.5°F | Ht 66.0 in | Wt 227.0 lb

## 2020-01-06 DIAGNOSIS — N6489 Other specified disorders of breast: Secondary | ICD-10-CM

## 2020-01-06 NOTE — Patient Instructions (Addendum)
Patient is scheduled for in office [place] on 01/20/20 at 9:15a with Dr.Cannon.  Sam from [facility] will contact you and get you scheduled for an appointment once she has received your orders.

## 2020-01-06 NOTE — Progress Notes (Signed)
Patient ID: Erin Church, female   DOB: 11/10/1960, 59 y.o.   MRN: [id number]  Chief Complaint  Patient presents with  . Follow-up     ref Dr Grayland Ormond eval for breast MRI on 12/29/19    HPI Erin Church is a 59 y.o. female.   She is a patient that I have seen in follow-up for one of our prior surgeons, Dr. Tama High.  He performed a lumpectomy and sentinel node biopsy for what proved to be stage Ia ER PR positive HER-2 positive invasive carcinoma of the upper inner quadrant of the right breast.  She has been followed by Dr. Grayland Ormond in hematology oncology where she received chemotherapy, radiation, and hormonal treatment.  She has had 3 episodes of nipple drainage and for this reason, she underwent a series of breast imaging.  She was noted to have a 5.8 cm seroma in the operative bed.  She has been referred back to see me for further evaluation and management.  She states that the episodes of nipple drainage have only occurred when she has been lying on her right side.  The drainage has been yellowish in color and thin and serous in nature.  She is sometimes, but not always, able to express a bit of discharge from that nipple.  She is more concerned about the tight feeling and discomfort in the area.  She is also interested in having her port removed, as she has completed all infusion therapy.  She denies any fevers or chills.  No nausea or vomiting.     Past Medical History:  Diagnosis Date  . Anginal pain ([place])   . Arthritis   . Breast cancer ([place])   . Carpal tunnel syndrome of right wrist   . Coronary atherosclerosis of unspecified type of vessel, native or graft   . Hand and foot pain   . HER2-positive carcinoma of breast (Erin Church)   . Hyperlipidemia   . Hypertension   . MVP (mitral valve prolapse)   . Personal history of chemotherapy   . Personal history of radiation therapy   . Sleep apnea   . Vertigo     Past Surgical History:  Procedure Laterality Date  . BREAST BIOPSY  Right 08/27/2018   [place] bx, [facility]  . BREAST LUMPECTOMY Right 09/11/2018  . BREAST LUMPECTOMY WITH NEEDLE LOCALIZATION AND AXILLARY SENTINEL LYMPH NODE BX Right 09/11/2018   Procedure: BREAST LUMPECTOMY WITH NEEDLE LOCALIZATION AND SENTINEL LYMPH NODE BX;  Surgeon: [name], MD;  Location: [facility];  Service: General;  Laterality: Right;  . CARDIAC CATHETERIZATION    . CATARACT EXTRACTION W/PHACO Left 01/01/2017   Procedure: CATARACT EXTRACTION PHACO AND INTRAOCULAR LENS PLACEMENT (IOC)left Restor lens;  Surgeon: Leandrew Koyanagi, MD;  Location: [place];  Service: Ophthalmology;  Laterality: Left;  Restor lens  . CATARACT EXTRACTION W/PHACO Right 01/31/2017   Procedure: CATARACT EXTRACTION PHACO AND INTRAOCULAR LENS PLACEMENT (IOC);  Surgeon: Leandrew Koyanagi, MD;  Location: [place];  Service: Ophthalmology;  Laterality: Right;  . CESAREAN SECTION    . CHOLECYSTECTOMY    . HYSTEROSCOPY  06/2010   D&C  . PORTACATH PLACEMENT Right 09/11/2018   Procedure: INSERTION PORT-A-CATH;  Surgeon: [name], MD;  Location: [facility];  Service: General;  Laterality: Right;  . TUBAL LIGATION      Family History  Problem Relation Age of Onset  . Hypertension Mother   . Cancer Mother 24       LUNG  .  Hypertension Father   . Colon cancer Paternal Uncle   . Breast cancer Neg Hx     Social History Social History   Tobacco Use  . Smoking status: Never Smoker  . Smokeless tobacco: Never Used  Substance Use Topics  . Alcohol use: Yes    Alcohol/week: 3.0 standard drinks    Types: 3 Shots of liquor per week    Comment: social drinker  . Drug use: No    No Known Allergies  Current Outpatient Medications  Medication Sig Dispense Refill  . albuterol (VENTOLIN HFA) 108 (90 Base) MCG/ACT inhaler Inhale into the lungs.    Marland Kitchen amLODipine-benazepril (LOTREL) 10-20 MG per capsule Take 1 capsule by mouth daily.     Marland Kitchen anastrozole (ARIMIDEX) 1 MG tablet Take 1 tablet  (1 mg total) by mouth daily. 90 tablet 3  . fluticasone (FLONASE) 50 MCG/ACT nasal spray Place 2 sprays into both nostrils daily as needed for allergies.     Marland Kitchen LIVALO 2 MG TABS Take 1 tablet by mouth daily.    Marland Kitchen LORazepam (ATIVAN) 0.5 MG tablet Take 0.5 mg by mouth as needed.     . meclizine (ANTIVERT) 25 MG tablet Take 25 mg by mouth 3 (three) times daily as needed (Vertigo).     Marland Kitchen omeprazole (PRILOSEC) 40 MG capsule Take 40 mg by mouth daily.    Marland Kitchen venlafaxine XR (EFFEXOR-XR) 75 MG 24 hr capsule Take 75 mg by mouth daily with breakfast.     . diclofenac (VOLTAREN) 75 MG EC tablet Take 1 tablet (75 mg total) by mouth 2 (two) times daily. (Patient not taking: Reported on 01/06/2020) 60 tablet 0  . lidocaine-prilocaine (EMLA) cream Apply to affected area once (Patient not taking: Reported on 01/06/2020) 30 g 3   Current Facility-Administered Medications  Medication Dose Route Frequency Provider Last Rate Last Admin  . betamethasone acetate-betamethasone sodium phosphate (CELESTONE) injection 3 mg  3 mg Intramuscular Once Edrick Kins, DPM        Review of Systems Review of Systems  All other systems reviewed and are negative. Or as discussed in the history of present illness.  Blood pressure (!) 159/84, pulse 88, temperature (!) 95.5 F (35.3 C), temperature source Temporal, height 5' 6" (1.676 m), weight 227 lb (103 kg), SpO2 96 %. Body mass index is 36.64 kg/m.  Physical Exam Physical Exam Vitals reviewed. Exam conducted with a chaperone present.  Constitutional:      General: She is not in acute distress.    Appearance: Normal appearance. She is obese.  HENT:     Head: Normocephalic and atraumatic.     Nose:     Comments: Covered with a mask secondary to COVID-19 precautions    Mouth/Throat:     Comments: Covered with a mask secondary to COVID-19 precautions Eyes:     General: No scleral icterus.       Right eye: No discharge.        Left eye: No discharge.      Conjunctiva/sclera: Conjunctivae normal.  Cardiovascular:     Rate and Rhythm: Normal rate and regular rhythm.  Pulmonary:     Effort: Pulmonary effort is normal. No respiratory distress.  Chest:     Breasts:        Left: Normal.       Comments: There is some puckering of her periareolar scar with associated swelling, firmness, and tenderness just medial, consistent with the imaging-proven seroma in the surgical site.  Chemotherapy  port in situ. Genitourinary:    Comments: Deferred Musculoskeletal:        General: No deformity or signs of injury.     Cervical back: No rigidity.  Lymphadenopathy:     Cervical: No cervical adenopathy.     Upper Body:     Right upper body: No supraclavicular, axillary or pectoral adenopathy.     Left upper body: No supraclavicular, axillary or pectoral adenopathy.  Skin:    General: Skin is warm and dry.  Neurological:     General: No focal deficit present.     Mental Status: She is alert and oriented to person, place, and time.  Psychiatric:        Mood and Affect: Mood normal.        Behavior: Behavior normal.     Data Reviewed I reviewed the imaging studies performed.  I agree with the radiologist impression from the mammogram as well as the MRI.  The results are copied here:  CLINICAL DATA:  Yellow and brown right nipple discharge. Patient has a history right breast cancer status post lumpectomy January of 2020 with known seroma at post surgical site.  EXAM: DIGITAL DIAGNOSTIC right MAMMOGRAM WITH CAD AND TOMO  ULTRASOUND right BREAST  COMPARISON:  Prior films  ACR Breast Density Category b: There are scattered areas of fibroglandular density.  FINDINGS: Cc and MLO views of the right breast, spot tangential view of right breast are submitted. Stable postsurgical seroma is identified in the right breast. No suspicious abnormality is identified.  Mammographic images were processed with CAD.  Targeted ultrasound is  performed, showing stable seroma at the right breast 1 o'clock 2 cm from nipple area unchanged compared to prior ultrasound. Ultrasound the retroareolar right breast demonstrate no focal intraductal abnormality identified.  IMPRESSION: Benign findings.  CLINICAL DATA:  59 year old female with a history of treated right breast cancer status post lumpectomy in January 2020 with known seroma at postsurgical site. Patient presented with yellow and brown right nipple discharge approximately 1 month ago with no suspicious findings identified on diagnostic mammographic/sonographic workup.  LABS:  None performed on site.  EXAM: BILATERAL BREAST MRI WITH AND WITHOUT CONTRAST  TECHNIQUE: Multiplanar, multisequence MR images of both breasts were obtained prior to and following the intravenous administration of 10 ml of Gadavist.  Three-dimensional MR images were rendered by post-processing of the original MR data on an independent workstation. The three-dimensional MR images were interpreted, and findings are reported in the following complete MRI report for this study. Three dimensional images were evaluated at the independent Dyna[employer]ad workstation  COMPARISON:  Previous exam(s).  FINDINGS: Breast composition: b. Scattered fibroglandular tissue.  Background parenchymal enhancement: Mild.  Right breast: A nonenhancing, 5.8 cm postoperative seroma is demonstrated in the upper inner right breast. No significant/suspicious rim enhancement is identified. No suspicious mass or abnormal enhancement in the remainder of the right breast, particularly in the subareolar region.  Left breast: No suspicious mass or abnormal enhancement.  Lymph nodes: No abnormal appearing lymph nodes.  Ancillary findings:  None.  IMPRESSION: 1. No suspicious MRI findings in either breast. 2. Benign right breast posttreatment changes, including a 5.8 cm postoperative seroma.  Assessment  This is a 59 year old woman status post lumpectomy and sentinel node biopsy, followed by chemotherapy radiation and hormonal management for ER/PR positive HER-2 positive right breast cancer.  As she describes the instances where in she has nipple drainage, it seems to be in conjunction with compression of the known seroma.  I am suspicious that perhaps there is a tract from a seroma into the lactiferous ducts which results in drainage.  While she was in clinic, she was able to express a small amount of fluid which I captured on a piece of gauze.  The appearance is very consistent with seroma fluid.  Plan Due to the drainage as well as her discomfort, we will arrange to have the seroma aspirated.  I suspect that this will resolve the nipple discharge issue.  She has also completed all of her infusion therapies and would like to have her port removed.  We will perform that in clinic on Jan 20, 2020.    Fredirick Maudlin 01/06/2020, 12:09 PM

## 2020-01-07 ENCOUNTER — Ambulatory Visit: Payer: 59 | Attending: Internal Medicine

## 2020-01-07 DIAGNOSIS — Z23 Encounter for immunization: Secondary | ICD-10-CM

## 2020-01-07 NOTE — Progress Notes (Signed)
   Covid-19 Vaccination Clinic  Name:  Erin Church    MRN: [id number] DOB: 18-Jul-1961  01/07/2020  [name] was observed post Covid-19 immunization for 15 minutes without incident. She was provided with Vaccine Information Sheet and instruction to access the V-Safe system.   [name] was instructed to call 911 with any severe reactions post vaccine: Marland Kitchen Difficulty breathing  . Swelling of face and throat  . A fast heartbeat  . A bad rash all over body  . Dizziness and weakness   Immunizations Administered    Name Date Dose VIS Date Route   Pfizer COVID-19 Vaccine 01/07/2020  3:05 PM 0.3 mL 10/22/2018 Intramuscular   Manufacturer: [place]   Lot: [id number]   [place]: [id number]

## 2020-01-16 ENCOUNTER — Ambulatory Visit
Admission: RE | Admit: 2020-01-16 | Discharge: 2020-01-16 | Disposition: A | Discharge status: Home / Self Care | Payer: 59 | Source: Ambulatory Visit | Attending: General Surgery | Admitting: General Surgery

## 2020-01-16 DIAGNOSIS — N6489 Other specified disorders of breast: Secondary | ICD-10-CM | POA: Insufficient documentation

## 2020-01-20 ENCOUNTER — Ambulatory Visit (INDEPENDENT_AMBULATORY_CARE_PROVIDER_SITE_OTHER): Payer: 59 | Admitting: General Surgery

## 2020-01-20 ENCOUNTER — Other Ambulatory Visit: Payer: Self-pay

## 2020-01-20 ENCOUNTER — Encounter: Payer: Self-pay | Admitting: General Surgery

## 2020-01-20 DIAGNOSIS — Z170 Estrogen receptor positive status [ER+]: Secondary | ICD-10-CM | POA: Diagnosis not present

## 2020-01-20 DIAGNOSIS — C50211 Malignant neoplasm of upper-inner quadrant of right female breast: Secondary | ICD-10-CM

## 2020-01-20 NOTE — Patient Instructions (Addendum)
Dr.Cannon recommends patient to refrain from showering until Thursday. Do not submerge in pools water.  Follow-up with our office as needed.   Please call and ask to speak with a nurse if you develop questions or concerns.  Implanted Port Removal, Care After This sheet gives you information about how to care for yourself after your procedure. Your health care provider may also give you more specific instructions. If you have problems or questions, contact your health care provider. What can I expect after the procedure? After the procedure, it is common to have:  Soreness or pain near your incision.  Some swelling or bruising near your incision. Follow these instructions at home: Medicines  Take over-the-counter and prescription medicines only as told by your health care provider.  If you were prescribed an antibiotic medicine, take it as told by your health care provider. Do not stop taking the antibiotic even if you start to feel better. Bathing  Do not take baths, swim, or use a hot tub until your health care provider approves. Ask your health care provider if you can take showers. You may only be allowed to take sponge baths. Incision care   Follow instructions from your health care provider about how to take care of your incision. Make sure you: ? Wash your hands with soap and water before you change your bandage (dressing). If soap and water are not available, use hand sanitizer. ? Change your dressing as told by your health care provider. ? Keep your dressing dry. ? Leave stitches (sutures), skin glue, or adhesive strips in place. These skin closures may need to stay in place for 2 weeks or longer. If adhesive strip edges start to loosen and curl up, you may trim the loose edges. Do not remove adhesive strips completely unless your health care provider tells you to do that.  Check your incision area every day for signs of infection. Check for: ? More redness, swelling, or  pain. ? More fluid or blood. ? Warmth. ? Pus or a bad smell. Driving   Do not drive for 24 hours if you were given a medicine to help you relax (sedative) during your procedure.  If you did not receive a sedative, ask your health care provider when it is safe to drive. Activity  Return to your normal activities as told by your health care provider. Ask your health care provider what activities are safe for you.  Do not lift anything that is heavier than 10 lb (4.5 kg), or the limit that you are told, until your health care provider says that it is safe.  Do not do activities that involve lifting your arms over your head. General instructions  Do not use any products that contain nicotine or tobacco, such as cigarettes and e-cigarettes. These can delay healing. If you need help quitting, ask your health care provider.  Keep all follow-up visits as told by your health care provider. This is important. Contact a health care provider if:  You have more redness, swelling, or pain around your incision.  You have more fluid or blood coming from your incision.  Your incision feels warm to the touch.  You have pus or a bad smell coming from your incision.  You have pain that is not relieved by your pain medicine. Get help right away if you have:  A fever or chills.  Chest pain.  Difficulty breathing. Summary  After the procedure, it is common to have pain, soreness, swelling, or bruising  near your incision.  If you were prescribed an antibiotic medicine, take it as told by your health care provider. Do not stop taking the antibiotic even if you start to feel better.  Do not drive for 24 hours if you were given a sedative during your procedure.  Return to your normal activities as told by your health care provider. Ask your health care provider what activities are safe for you. This information is not intended to replace advice given to you by your health care provider. Make  sure you discuss any questions you have with your health care provider. Document Revised: 09/27/2017 Document Reviewed: 09/27/2017 Elsevier Patient Education  2020 [employer].

## 2020-01-20 NOTE — Progress Notes (Signed)
Operative Note  Preoperative Diagnosis:  Port-a-Cath in place, chemotherapy completed  Postoperative Diagnosis:  same  Operation:  Port-a-Cath removal  Surgeon: Fredirick Maudlin, MD  Assistant: none   Anesthesia: 1% lidocaine with epinaphrine  Findings: normal encapsulated port, catheter removed intact  Indications: 59 y/o F with history of breast cancer, now done with treatment.  Port to be removed.  Procedure In Detail: After written consent was obtained, the patient was placed supine on the procedure table.  She was sterilely prepped and draped in standard fashion.  We confirmed the patient and the procedure being performed and all were in agreement.  1% lidocaine with epinephrine was infiltrated into the skin and soft tissue surrounding the port.  The skin was sharply incised over the port.  A combination of blunt and sharp dissection was utilized to free the port from the capsule.  No suture was encountered.  The port was removed and there was no sign of bleeding.  The wound was then closed with deep dermal 3-0 Vicryl and running subcuticular 4-0 Monocryl.  The skin was cleaned.  Dermabond and Steri-Strips were applied.  The patient tolerated the procedure well.  EBL: Less than 1 cc  IVF: Not applicable  Specimen(s): Port-A-Cath--sent with patient per her request  Complications: none immediately apparent.   I was present for and participated in the entire operation.  Fredirick Maudlin 01/20/20 10:10 AM

## 2020-02-17 ENCOUNTER — Other Ambulatory Visit: Payer: Self-pay | Admitting: Oncology

## 2020-03-03 ENCOUNTER — Telehealth: Payer: Self-pay

## 2020-03-03 NOTE — Telephone Encounter (Signed)
T/C to pt to discuss SCP visit.  No answer but left message to call me to review SCP and Treatment Summary.  Waiting for return call

## 2020-03-24 ENCOUNTER — Inpatient Hospital Stay: Payer: 59

## 2020-03-24 ENCOUNTER — Inpatient Hospital Stay: Payer: 59 | Admitting: Oncology

## 2020-04-02 NOTE — Progress Notes (Signed)
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  INTERVAL HISTORY: Patient returns to clinic today for routine 36-monthevaluation.  She currently feels well and is asymptomatic. She is tolerating anastrozole without significant side effects.  She has no neurologic complaints.  She denies any recent fevers or illnesses.  She has a good appetite and denies weight loss.  She denies any chest pain, shortness of breath, cough, or hemoptysis.  She denies any nausea, vomiting, constipation, or diarrhea.  She has no urinary complaints.  Patient offers no further specific complaints today.  Patient offers no further specific complaints today.  REVIEW OF SYSTEMS:   Review of Systems  Constitutional: Negative.  Negative for fever, malaise/fatigue and weight loss.  Respiratory: Negative.  Negative for cough, hemoptysis and shortness of breath.   Cardiovascular: Negative.  Negative for chest pain and leg swelling.  Gastrointestinal: Negative.  Negative for abdominal pain, blood in stool and melena.  Genitourinary: Negative.  Negative for dysuria.  Musculoskeletal: Negative.  Negative for back pain.  Skin: Negative.  Negative for rash.  Neurological: Negative.  Negative for seizures, weakness and headaches.  Psychiatric/Behavioral: Negative.  The patient is not nervous/anxious.     As per HPI. Otherwise, a complete review of systems is negative.  PAST MEDICAL  HISTORY: Past Medical History:  Diagnosis Date  . Anginal pain ([place]   . Arthritis   . Breast cancer ([place]   . Carpal tunnel syndrome of right wrist   . Coronary atherosclerosis of unspecified type of vessel, native or graft   . Hand and foot pain   . HER[place]-positive carcinoma of breast ([place]   . Hyperlipidemia   . Hypertension   . MVP (mitral valve prolapse)   . Personal history of chemotherapy   . Personal history of radiation therapy   . Sleep apnea   . Vertigo     PAST SURGICAL HISTORY: Past Surgical History:  Procedure Laterality Date  . BREAST BIOPSY Right 08/27/2018   [place], [facility]  . BREAST LUMPECTOMY Right 09/11/2018  . BREAST LUMPECTOMY WITH NEEDLE LOCALIZATION AND AXILLARY SENTINEL LYMPH NODE BX Right 09/11/2018   Procedure: BREAST LUMPECTOMY WITH NEEDLE LOCALIZATION AND SENTINEL LYMPH NODE BX;  Surgeon: DVickie Epley MD;  Location: [facility];  Service: General;  Laterality: Right;  . CARDIAC CATHETERIZATION    . CATARACT EXTRACTION W/PHACO Left 01/01/2017   Procedure: CATARACT EXTRACTION PHACO AND INTRAOCULAR LENS PLACEMENT (IOC)left Restor lens;  Surgeon: BLeandrew Koyanagi MD;  [facility]  Service: Ophthalmology;  Laterality: Left;  Restor lens  . CATARACT EXTRACTION W/PHACO Right 01/31/2017   Procedure: CATARACT EXTRACTION PHACO AND INTRAOCULAR LENS PLACEMENT (IOC);  Surgeon: BLeandrew Koyanagi MD;  [place] [facility]  Service: Ophthalmology;  Laterality: Right;  . CESAREAN SECTION    . CHOLECYSTECTOMY    . HYSTEROSCOPY  06/2010   D&C  . PORTACATH PLACEMENT Right 09/11/2018   Procedure: INSERTION PORT-A-CATH;  Surgeon: DVickie Epley MD;  Location: [facility];  Service: General;  Laterality: Right;  .  TUBAL LIGATION      FAMILY HISTORY: Family History  Problem Relation Age of Onset  . Hypertension Mother   . Cancer Mother 22       LUNG  . Hypertension Father   . Colon cancer Paternal Uncle   . Breast cancer Neg Hx      ADVANCED DIRECTIVES (Y/N):  N  HEALTH MAINTENANCE: Social History   Tobacco Use  . Smoking status: Never Smoker  . Smokeless tobacco: Never Used  Vaping Use  . Vaping Use: Never used  Substance Use Topics  . Alcohol use: Yes    Alcohol/week: 3.0 standard drinks    Types: 3 Shots of liquor per week    Comment: social drinker  . Drug use: No     Colonoscopy:  PAP:  Bone density:  Lipid panel:  No Known Allergies  Current Outpatient Medications  Medication Sig Dispense Refill  . amLODipine-benazepril (LOTREL) 10-20 MG per capsule Take 1 capsule by mouth daily.     Marland Kitchen anastrozole (ARIMIDEX) 1 MG tablet TAKE ONE TABLET EVERY DAY 90 tablet 3  . diclofenac (VOLTAREN) 75 MG EC tablet Take 1 tablet (75 mg total) by mouth 2 (two) times daily. 60 tablet 0  . fluticasone (FLONASE) 50 MCG/ACT nasal spray Place 2 sprays into both nostrils daily as needed for allergies.     Marland Kitchen LORazepam (ATIVAN) 0.5 MG tablet Take 0.5 mg by mouth as needed.     Marland Kitchen omeprazole (PRILOSEC) 40 MG capsule Take 40 mg by mouth daily.    Marland Kitchen venlafaxine XR (EFFEXOR-XR) 75 MG 24 hr capsule Take 75 mg by mouth daily with breakfast.      Current Facility-Administered Medications  Medication Dose Route Frequency Provider Last Rate Last Admin  . betamethasone acetate-betamethasone sodium phosphate (CELESTONE) injection 3 mg  3 mg Intramuscular Once Daylene Katayama M, DPM        OBJECTIVE: Vitals:   04/07/20 1034  BP: (!) 149/95  Pulse: 91  Resp: 16  Temp: 98.6 F (37 C)  SpO2: 98%     Body mass index is 36.32 kg/m.    ECOG FS:0 - Asymptomatic  General: Well-developed, well-nourished, no acute distress. Eyes: Pink conjunctiva, anicteric sclera. HEENT: Normocephalic, moist mucous membranes. Breasts: Exam recently performed by another provider. Lungs: No audible wheezing or coughing. Heart: Regular rate and rhythm. Abdomen: Soft, nontender, no obvious distention. Musculoskeletal: No edema, cyanosis, or  clubbing. Neuro: Alert, answering all questions appropriately. Cranial nerves grossly intact. Skin: No rashes or petechiae noted. Psych: Normal affect.  LAB RESULTS:  Lab Results  Component Value Date   NA 137 04/07/2020   K 3.9 04/07/2020   CL 101 04/07/2020   CO2 26 04/07/2020   GLUCOSE 117 (H) 04/07/2020   BUN 14 04/07/2020   CREATININE 0.72 04/07/2020   CALCIUM 9.2 04/07/2020   PROT 8.2 (H) 04/07/2020   ALBUMIN 3.7 04/07/2020   AST 22 04/07/2020   ALT 18 04/07/2020   ALKPHOS 117 04/07/2020   BILITOT 0.5 04/07/2020   GFRNONAA >60 04/07/2020   GFRAA >60 04/07/2020    Lab Results  Component Value Date   WBC 7.8 04/07/2020   NEUTROABS 4.1 04/07/2020   HGB 14.2 04/07/2020   HCT 41.7 04/07/2020   MCV 89.1 04/07/2020   PLT 395 04/07/2020     STUDIES: No results found.  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  She also had port placement at the same time.  She completed 12 weekly cycles of Herceptin plus Taxol on December 26, 2018 and then completed her year-long maintenance Kanjinti (bio similar to Herceptin) on December 24, 2019.  She also completed adjuvant XRT.  Continue anastrozole for a total of 5 years completing treatment in July 2025.  Patient's most recent breast imaging was an MRI completed on Dec 29, 2019 that revealed no evidence of recurrent or progressive disease.  Repeat mammogram in May 2022.  Return to clinic in 6 months for routine evaluation.   2.  Osteopenia: Patient had a baseline bone mineral density on April 01, 2019 that revealed a T score of -1.6.  Repeat bone mineral density in the next 1 to 2 weeks.  Patient has been instructed to increase her calcium and vitamin D  intake. 3.  Hot flashes/joint pain: Resolved since discontinuing letrozole.  Anastrozole as above. 4.  Nipple discharge: Resolved.  Follow-up with surgery as needed.   Patient expressed understanding and was in agreement with this plan. She also understands that She can call clinic at any time with any questions, concerns, or complaints.   Cancer Staging Malignant neoplasm of upper-inner quadrant of right breast in female, estrogen receptor positive ([place]) Staging form: Breast, AJCC 8th Edition - Clinical stage from 09/06/2018: Stage IA (cT1c, cN0, cM0, G3, ER+, PR+, HER2+) - Signed by Lloyd Huger, MD on 09/06/2018   Lloyd Huger, MD   04/07/2020 11:48 AM

## 2020-04-07 ENCOUNTER — Encounter: Payer: Self-pay | Admitting: Oncology

## 2020-04-07 ENCOUNTER — Other Ambulatory Visit: Payer: Self-pay

## 2020-04-07 ENCOUNTER — Inpatient Hospital Stay: Payer: 59 | Attending: Oncology

## 2020-04-07 ENCOUNTER — Inpatient Hospital Stay (HOSPITAL_BASED_OUTPATIENT_CLINIC_OR_DEPARTMENT_OTHER): Payer: 59 | Admitting: Oncology

## 2020-04-07 VITALS — BP 149/95 | HR 91 | Temp 98.6°F | Resp 16 | Wt 225.0 lb

## 2020-04-07 DIAGNOSIS — Z170 Estrogen receptor positive status [ER+]: Secondary | ICD-10-CM

## 2020-04-07 DIAGNOSIS — C50211 Malignant neoplasm of upper-inner quadrant of right female breast: Secondary | ICD-10-CM

## 2020-04-07 DIAGNOSIS — I10 Essential (primary) hypertension: Secondary | ICD-10-CM | POA: Insufficient documentation

## 2020-04-07 DIAGNOSIS — M8580 Other specified disorders of bone density and structure, unspecified site: Secondary | ICD-10-CM | POA: Insufficient documentation

## 2020-04-07 DIAGNOSIS — Z79811 Long term (current) use of aromatase inhibitors: Secondary | ICD-10-CM | POA: Diagnosis not present

## 2020-04-07 DIAGNOSIS — Z79899 Other long term (current) drug therapy: Secondary | ICD-10-CM | POA: Diagnosis not present

## 2020-04-07 LAB — CBC WITH DIFFERENTIAL/PLATELET
Abs Immature Granulocytes: 0.02 10*3/uL (ref 0.00–0.07)
Basophils Absolute: 0.1 10*3/uL (ref 0.0–0.1)
Basophils Relative: 1 %
Eosinophils Absolute: 0.1 10*3/uL (ref 0.0–0.5)
Eosinophils Relative: 1 %
HCT: 41.7 % (ref 36.0–46.0)
Hemoglobin: 14.2 g/dL (ref 12.0–15.0)
Immature Granulocytes: 0 %
Lymphocytes Relative: 33 %
Lymphs Abs: 2.5 10*3/uL (ref 0.7–4.0)
MCH: 30.3 pg (ref 26.0–34.0)
MCHC: 34.1 g/dL (ref 30.0–36.0)
MCV: 89.1 fL (ref 80.0–100.0)
Monocytes Absolute: 0.9 10*3/uL (ref 0.1–1.0)
Monocytes Relative: 12 %
Neutro Abs: 4.1 10*3/uL (ref 1.7–7.7)
Neutrophils Relative %: 53 %
Platelets: 395 10*3/uL (ref 150–400)
RBC: 4.68 MIL/uL (ref 3.87–5.11)
RDW: 13.4 % (ref 11.5–15.5)
WBC: 7.8 10*3/uL (ref 4.0–10.5)
nRBC: 0 % (ref 0.0–0.2)

## 2020-04-07 LAB — COMPREHENSIVE METABOLIC PANEL
ALT: 18 U/L (ref 0–44)
AST: 22 U/L (ref 15–41)
Albumin: 3.7 g/dL (ref 3.5–5.0)
Alkaline Phosphatase: 117 U/L (ref 38–126)
Anion gap: 10 (ref 5–15)
BUN: 14 mg/dL (ref 6–20)
CO2: 26 mmol/L (ref 22–32)
Calcium: 9.2 mg/dL (ref 8.9–10.3)
Chloride: 101 mmol/L (ref 98–111)
Creatinine, Ser: 0.72 mg/dL (ref 0.44–1.00)
GFR calc Af Amer: >60 mL/min (ref >60)
GFR calc non Af Amer: >60 mL/min (ref >60)
Glucose, Bld: 117 mg/dL — ABNORMAL HIGH (ref 70–99)
Potassium: 3.9 mmol/L (ref 3.5–5.1)
Sodium: 137 mmol/L (ref 135–145)
Total Bilirubin: 0.5 mg/dL (ref 0.3–1.2)
Total Protein: 8.2 g/dL — ABNORMAL HIGH (ref 6.5–8.1)

## 2020-04-07 NOTE — Progress Notes (Signed)
Survivorship Care Plan visit completed.  Treatment summary reviewed and given to patient.  ASCO answers booklet reviewed and given to patient.  CARE program and Cancer Transitions discussed with patient along with other resources cancer center offers to patients and caregivers.  Patient verbalized understanding.  Patient wants to do Cancer Transitions for next class and also wanting to see Gwenette Greet for lymphedema screening.     Patient in agreement for APP to have a Virtual visit to introduce them to the Survivorship Clinic tomorrow 04/08/2020 at 2:00.  Encouraged patient to call for any questions or concerns.

## 2020-04-08 ENCOUNTER — Inpatient Hospital Stay: Payer: 59 | Admitting: Nurse Practitioner

## 2020-04-14 ENCOUNTER — Other Ambulatory Visit: Payer: Self-pay

## 2020-04-14 ENCOUNTER — Inpatient Hospital Stay: Payer: 59 | Admitting: Occupational Therapy

## 2020-04-14 ENCOUNTER — Ambulatory Visit
Admission: RE | Admit: 2020-04-14 | Discharge: 2020-04-14 | Disposition: A | Discharge status: Home / Self Care | Payer: 59 | Source: Ambulatory Visit | Attending: Oncology | Admitting: Oncology

## 2020-04-14 DIAGNOSIS — Z170 Estrogen receptor positive status [ER+]: Secondary | ICD-10-CM | POA: Diagnosis present

## 2020-04-14 DIAGNOSIS — C50211 Malignant neoplasm of upper-inner quadrant of right female breast: Secondary | ICD-10-CM | POA: Insufficient documentation

## 2020-04-14 DIAGNOSIS — I890 Lymphedema, not elsewhere classified: Secondary | ICD-10-CM

## 2020-04-14 NOTE — Therapy (Signed)
[facility] [address] [place], [place], [place], [address] Phone: [phone]   Fax:  [phone]  Occupational Therapy Screen  Patient Details  Name: Erin Church MRN: [id number] Date of Birth: [birth year] No data recorded  Encounter Date: 04/14/2020   OT End of Session - 04/14/20 1350    Visit Number 0           Past Medical History:  Diagnosis Date  . Anginal pain ([place])   . Arthritis   . Breast cancer ([place])   . Carpal tunnel syndrome of right wrist   . Coronary atherosclerosis of unspecified type of vessel, native or graft   . Hand and foot pain   . HER2-positive carcinoma of breast ([place])   . Hyperlipidemia   . Hypertension   . MVP (mitral valve prolapse)   . Personal history of chemotherapy   . Personal history of radiation therapy   . Sleep apnea   . Vertigo     Past Surgical History:  Procedure Laterality Date  . BREAST BIOPSY Right 08/27/2018   [place], [facility]  . BREAST LUMPECTOMY Right 09/11/2018  . BREAST LUMPECTOMY WITH NEEDLE LOCALIZATION AND AXILLARY SENTINEL LYMPH NODE BX Right 09/11/2018   Procedure: BREAST LUMPECTOMY WITH NEEDLE LOCALIZATION AND SENTINEL LYMPH NODE BX;  Surgeon: [name], MD;  Location: [facility];  Service: General;  Laterality: Right;  . CARDIAC CATHETERIZATION    . CATARACT EXTRACTION W/PHACO Left 01/01/2017   Procedure: CATARACT EXTRACTION PHACO AND INTRAOCULAR LENS PLACEMENT (IOC)left Restor lens;  Surgeon: Leandrew Koyanagi, MD;  Location: [place];  Service: Ophthalmology;  Laterality: Left;  Restor lens  . CATARACT EXTRACTION W/PHACO Right 01/31/2017   Procedure: CATARACT EXTRACTION PHACO AND INTRAOCULAR LENS PLACEMENT (IOC);  Surgeon: Leandrew Koyanagi, MD;  Location: [place];  Service: Ophthalmology;  Laterality: Right;  . CESAREAN SECTION    . CHOLECYSTECTOMY    . HYSTEROSCOPY  06/2010   D&C  . PORTACATH PLACEMENT Right 09/11/2018    Procedure: INSERTION PORT-A-CATH;  Surgeon: [name], MD;  Location: [facility];  Service: General;  Laterality: Right;  . 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  She also had port placement at the same time.  She completed 12 weekly cycles of Herceptin plus Taxol on December 26, 2018 and then completed her year-long maintenance Kanjinti (bio similar to Herceptin) on December 24, 2019.  She also completed adjuvant XRT.  Continue anastrozole for a total of 5 years completing treatment in July 2025.  Patient's most recent breast imaging was an MRI completed on Dec 29, 2019 that revealed no evidence of recurrent or progressive disease.  Repeat mammogram in May 2022.  Return to clinic in 6 months for routine evaluation.   2.  Osteopenia: Patient had a baseline bone mineral density on April 01, 2019 that revealed a T score of -1.6.  Repeat bone mineral density in the next 1 to 2 weeks.  Patient has been instructed to increase her calcium and vitamin D intake. 3.  Hot flashes/joint pain: Resolved since discontinuing letrozole.  Anastrozole as above. 4.  Nipple  discharge: Resolved.  Follow-up with surgery as needed.   OT SCREEN TODAY 04/14/2020:  There were no vitals filed for this visit.   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 Yes    Pain Score 3     Pain Location Breast   R arm   Pain Orientation Right     Pain Descriptors / Indicators Tightness;Tender;Heaviness   full feeling   Pain Type Surgical pain    Pain Onset More than a month ago    Pain Frequency Intermittent    Aggravating Factors  When using my arm a lot - like cleaning houses               LYMPHEDEMA/ONCOLOGY QUESTIONNAIRE - 04/14/20 0001      Right Upper Extremity Lymphedema   15 cm Proximal to Olecranon Process 37.2 cm    10 cm Proximal to Olecranon Process 34.8 cm    Olecranon Process 27.8 cm    15 cm Proximal to Ulnar Styloid Process 26.5 cm    10 cm Proximal to Ulnar Styloid Process 23 cm    Just Proximal to Ulnar Styloid Process 17.3 cm    Across Hand at [employer] 19 cm    At [place] of 2nd Digit 7 cm    At [facility] of Thumb 6.8 cm      Left Upper Extremity Lymphedema   15 cm Proximal to Olecranon Process 35 cm    10 cm Proximal to Olecranon Process 32.4 cm    Olecranon Process 27.8 cm    15 cm Proximal to Ulnar Styloid Process 25.4 cm    10 cm Proximal to Ulnar Styloid Process 22 cm    Just Proximal to Ulnar Styloid Process 17 cm    Across Hand at [employer] 19 cm    At [place] of 2nd Digit 7 cm    At [facility] of Thumb 6.5 cm            Pt refer by Basilia Jumbo from Cancer transition  Pt had seroma with nipple drainage in May 2021 - Seroma of 5.8 cm on medial R breast was aspirated in May 2021.  Pt report that her R  breast are still tender, hard and full - and she is back to cleaning houses -and at the end of day her arm is full , heavy and tight - as well as under her arm and R breast. She thought it is normal and just need to live with it. When her 60 yrs old grandson jump into her arms in pool - her breast can hurt if he hits it.  Pt has fibrotic scar tissue in the area where seroma was - ed pt on some soft tissue mobs and to wear bra that provide some compression under the arm. She can do fibrotic tech on medial breast and MLD over AAA from R to L - 10 reps And R AI - 10 reps  Pt's R UE increase in upper  arm 2.4 cm , elbow same and forearm.1 cm - pt is R hand dominant -but 2.4 cm more than allowed for dominance. Would recommend for pt to use over the counter sleeve and  glove with high risk activities - cleaning houses, vacuuming, carrying/lifting any heavy. Will reassess in 2 wks                           Patient will benefit from skilled therapeutic intervention in order to improve the following deficits and impairments:           Visit Diagnosis: Lymphedema, not elsewhere classified    Problem List Patient Active Problem List   Diagnosis Date Noted  . Malignant neoplasm of upper-inner quadrant of right breast in female, estrogen receptor positive ([place]) 09/05/2018  . Menopause 10/23/2017  . Vaginal odor 10/23/2017  . MVP (mitral valve prolapse) 02/13/2014  . Chest pain 02/03/2014  . Coronary artery disease 02/03/2014  . Essential hypertension 02/03/2014  . Hyperlipidemia 02/03/2014  . Myalgia 02/03/2014  . Cardiac function test normal 02/03/2014  . Normal cardiac stress test 02/03/2014    Rosalyn Gess OTR/L,CLT 04/14/2020, 1:51 PM  [facility] [address] [place], [place], [place], [address] Phone: [phone]   Fax:  [phone]  Name: Erin Church MRN: [id number] Date of Birth: 04/03/1961

## 2020-04-15 ENCOUNTER — Inpatient Hospital Stay (HOSPITAL_BASED_OUTPATIENT_CLINIC_OR_DEPARTMENT_OTHER): Payer: 59 | Admitting: Nurse Practitioner

## 2020-04-15 DIAGNOSIS — Z170 Estrogen receptor positive status [ER+]: Secondary | ICD-10-CM | POA: Diagnosis not present

## 2020-04-15 DIAGNOSIS — C50211 Malignant neoplasm of upper-inner quadrant of right female breast: Secondary | ICD-10-CM | POA: Diagnosis not present

## 2020-04-15 NOTE — Progress Notes (Signed)
Survivorship Clinic Consult Note [facility]  [phone] [phone] Virtual Visit Progress Note  I connected with Erin Church on 04/15/20 at  2:00 PM EDT by video enabled telemedicine visit and verified that I am speaking with the correct person using two identifiers.   I discussed the limitations, risks, security and privacy concerns of performing an evaluation and management service by telemedicine and the availability of in-person appointments. I also discussed with the patient that there may be a patient responsible charge related to this service. The patient expressed understanding and agreed to proceed.   Other persons participating in the visit and their role in the encounter: none  Patient's location: home Provider's location: clinic  Chief Complaint: Breast cancer   CLINIC: Survivorship  REASON FOR VISIT: Long-term survivorship surveillance visit for history of breast cancer  BRIEF ONCOLOGIC HISTORY:  Oncology History  Malignant neoplasm of upper-inner quadrant of right breast in female, estrogen receptor positive ([place])  09/05/2018 Initial Diagnosis   Malignant neoplasm of upper-inner quadrant of right breast in female, estrogen receptor positive ([place])   09/06/2018 Cancer Staging   Staging form: Breast, AJCC 8th Edition - Clinical stage from 09/06/2018: Stage IA (cT1c, cN0, cM0, G3, ER+, PR+, HER2+) - Signed by Lloyd Huger, MD on 09/06/2018   10/10/2018 -  Chemotherapy   The patient had trastuzumab (HERCEPTIN) 399 mg in sodium chloride 0.9 % 250 mL chemo infusion, 4 mg/kg = 399 mg, Intravenous,  Once, 7 of 7 cycles Administration: 399 mg (10/10/2018), 189 mg (10/17/2018), 189 mg (11/07/2018), 600 mg (01/16/2019), 600 mg (02/06/2019), 189 mg (10/24/2018), 189 mg (10/31/2018), 189 mg (11/14/2018), 189 mg (11/21/2018), 189 mg (11/28/2018), 189 mg (12/05/2018), 189 mg (12/12/2018), 189 mg (12/19/2018), 189 mg (12/26/2018), 600 mg (02/27/2019),  600 mg (03/20/2019) PACLitaxel (TAXOL) 168 mg in sodium chloride 0.9 % 250 mL chemo infusion (</= 15m/m2), 80 mg/m2 = 168 mg, Intravenous,  Once, 3 of 3 cycles Administration: 168 mg (10/10/2018), 168 mg (10/17/2018), 168 mg (11/07/2018), 168 mg (10/24/2018), 168 mg (10/31/2018), 168 mg (11/14/2018), 168 mg (11/21/2018), 168 mg (11/28/2018), 168 mg (12/05/2018), 168 mg (12/12/2018), 168 mg (12/19/2018), 168 mg (12/26/2018) trastuzumab-anns (KANJINTI) 600 mg in sodium chloride 0.9 % 250 mL chemo infusion, 588 mg (100 % of original dose 6 mg/kg), Intravenous,  Once, 12 of 12 cycles Dose modification: 6 mg/kg (original dose 6 mg/kg, Cycle 8, Reason: Other (see comments), Comment: biosimilar) Administration: 600 mg (04/10/2019), 600 mg (05/01/2019), 600 mg (05/22/2019), 600 mg (06/12/2019), 600 mg (07/03/2019), 600 mg (08/07/2019), 600 mg (08/28/2019), 600 mg (10/01/2019), 600 mg (10/22/2019), 600 mg (11/12/2019), 600 mg (12/03/2019), 600 mg (12/24/2019)  for chemotherapy treatment.      INTERVAL HISTORY: Patient presents to the survivorship clinic today for initial meeting to review her survivorship care plan detailing her treatment course for breast cancer, as well as monitoring long-term side effects of that treatment, education regarding health maintenance, screening, and overall wellness and health promotion.  She was seen by Dr. FGrayland Ormondon 04/07/2020 and says she is tolerating anastrozole well without significant side effects. Overall, she reports feeling well since completing treatment.  She offers no specific complaints today.  REVIEW OF SYSTEMS:  Review of Systems  Constitutional: Negative for chills, fever, malaise/fatigue and weight loss.  HENT: Negative for hearing loss, nosebleeds, sore throat and tinnitus.   Eyes: Negative for blurred vision and double vision.  Respiratory: Negative for cough, hemoptysis, shortness of breath and wheezing.   Cardiovascular: Negative  for chest pain, palpitations and leg swelling.   Gastrointestinal: Negative for abdominal pain, blood in stool, constipation, diarrhea, melena, nausea and vomiting.  Genitourinary: Negative for dysuria and urgency.  Musculoskeletal: Negative for back pain, falls, joint pain and myalgias.  Skin: Negative for itching and rash.  Neurological: Negative for dizziness, tingling, sensory change, loss of consciousness, weakness and headaches.  Endo/Heme/Allergies: Negative for environmental allergies. Does not bruise/bleed easily.  Psychiatric/Behavioral: Negative for depression. The patient is not nervous/anxious and does not have insomnia.   Breast: No new lumps, bumps, skin changes, discomfort, or discharge. No nipple changes.      PAST MEDICAL/SURGICAL HISTORY:  Past Medical History:  Diagnosis Date  . Anginal pain ([place])   . Arthritis   . Breast cancer ([place])   . Carpal tunnel syndrome of right wrist   . Coronary atherosclerosis of unspecified type of vessel, native or graft   . Hand and foot pain   . HER2-positive carcinoma of breast ([place])   . Hyperlipidemia   . Hypertension   . MVP (mitral valve prolapse)   . Personal history of chemotherapy   . Personal history of radiation therapy   . Sleep apnea   . Vertigo    Past Surgical History:  Procedure Laterality Date  . BREAST BIOPSY Right 08/27/2018   [place] bx, [facility]  . BREAST LUMPECTOMY Right 09/11/2018  . BREAST LUMPECTOMY WITH NEEDLE LOCALIZATION AND AXILLARY SENTINEL LYMPH NODE BX Right 09/11/2018   Procedure: BREAST LUMPECTOMY WITH NEEDLE LOCALIZATION AND SENTINEL LYMPH NODE BX;  Surgeon: [name], MD;  Location: [facility];  Service: General;  Laterality: Right;  . CARDIAC CATHETERIZATION    . CATARACT EXTRACTION W/PHACO Left 01/01/2017   Procedure: CATARACT EXTRACTION PHACO AND INTRAOCULAR LENS PLACEMENT (IOC)left Restor lens;  Surgeon: Leandrew Koyanagi, MD;  Location: [place];  Service: Ophthalmology;  Laterality: Left;  Restor lens  . CATARACT EXTRACTION  W/PHACO Right 01/31/2017   Procedure: CATARACT EXTRACTION PHACO AND INTRAOCULAR LENS PLACEMENT (IOC);  Surgeon: Leandrew Koyanagi, MD;  Location: [place];  Service: Ophthalmology;  Laterality: Right;  . CESAREAN SECTION    . CHOLECYSTECTOMY    . HYSTEROSCOPY  06/2010   D&C  . PORTACATH PLACEMENT Right 09/11/2018   Procedure: INSERTION PORT-A-CATH;  Surgeon: [name], MD;  Location: [facility];  Service: General;  Laterality: Right;  . TUBAL LIGATION      SOCIAL HISTORY:  Social History   Socioeconomic History  . Marital status: Widowed    Spouse name: Not on file  . Number of children: 3  . Years of education: 46  . Highest education level: Not on file  Occupational History  . Occupation: SELF EMPLOYED  Tobacco Use  . Smoking status: Never Smoker  . Smokeless tobacco: Never Used  Vaping Use  . Vaping Use: Never used  Substance and Sexual Activity  . Alcohol use: Yes    Alcohol/week: 3.0 standard drinks    Types: 3 Shots of liquor per week    Comment: social drinker  . Drug use: No  . Sexual activity: Yes    Birth control/protection: Post-menopausal  Other Topics Concern  . Not on file  Social History Narrative  . Not on file   Social Determinants of Health   Financial Resource Strain:   . Difficulty of Paying Living Expenses: Not on file  Food Insecurity:   . Worried About Charity fundraiser in the Last Year: Not on file  . Ran Out  of Food in the Last Year: Not on file  Transportation Needs:   . Lack of Transportation (Medical): Not on file  . Lack of Transportation (Non-Medical): Not on file  Physical Activity:   . Days of Exercise per Week: Not on file  . Minutes of Exercise per Session: Not on file  Stress:   . Feeling of Stress : Not on file  Social Connections:   . Frequency of Communication with Friends and Family: Not on file  . Frequency of Social Gatherings with Friends and Family: Not on file  . Attends Religious Services: Not on  file  . Active Member of Clubs or Organizations: Not on file  . Attends Archivist Meetings: Not on file  . Marital Status: Not on file  Intimate Partner Violence:   . Fear of Current or Ex-Partner: Not on file  . Emotionally Abused: Not on file  . Physically Abused: Not on file  . Sexually Abused: Not on file   Immunization History  Administered Date(s) Administered  . Influenza-Unspecified 10/25/2017  . PFIZER SARS-COV-2 Vaccination 12/13/2019, 01/07/2020  . Tdap 01/09/2017  . Zoster Recombinat (Shingrix) 06/13/2018, 09/03/2018     ALLERGIES:  No Known Allergies  CURRENT MEDICATIONS:  Outpatient Encounter Medications as of 04/15/2020  Medication Sig  . amLODipine-benazepril (LOTREL) 10-20 MG per capsule Take 1 capsule by mouth daily.   Marland Kitchen anastrozole (ARIMIDEX) 1 MG tablet TAKE ONE TABLET EVERY DAY  . diclofenac (VOLTAREN) 75 MG EC tablet Take 1 tablet (75 mg total) by mouth 2 (two) times daily.  . fluticasone (FLONASE) 50 MCG/ACT nasal spray Place 2 sprays into both nostrils daily as needed for allergies.   Marland Kitchen LORazepam (ATIVAN) 0.5 MG tablet Take 0.5 mg by mouth as needed.   Marland Kitchen omeprazole (PRILOSEC) 40 MG capsule Take 40 mg by mouth daily.  Marland Kitchen venlafaxine XR (EFFEXOR-XR) 75 MG 24 hr capsule Take 75 mg by mouth daily with breakfast.    Facility-Administered Encounter Medications as of 04/15/2020  Medication  . betamethasone acetate-betamethasone sodium phosphate (CELESTONE) injection 3 mg    ONCOLOGIC FAMILY HISTORY:  Family History  Problem Relation Age of Onset  . Hypertension Mother   . Cancer Mother 25       LUNG  . Hypertension Father   . Colon cancer Paternal Uncle   . Breast cancer Neg Hx     GENETIC COUNSELING/TESTING: Not indicated  PHYSICAL EXAMINATION:  Exam limited d/t telemedicine  LABORATORY DATA:  No results found for: LABCA2  IMAGING STUDIES:  04/15/20- Bone Density - T score - 1.8 consistent with osteopenia  ASSESSMENT & PLAN:  Ms.  Limon is a pleasant 59 y.o. female with history of Stage 1A ER/PR positive HER-2 positive invasive carcinoma of the upper inner quadrant of the right breast s/p lumpectomy on 09/11/2018 followed by 12 cycles of weekly Herceptin plus Taxol and adjuvant XRT followed by 12 months of maintenance Kanjinti (herceptin biosimilar) completed on 12/24/19. She continues anastrozole with plan for 5 years of therapy completing in July 2025.  She presents to survivorship clinic for initial meeting and routine follow-up since completing treatment.  We reviewed her survivorship care plan and discussed acute survivorship concerns since completing treatment.  1. History of stage IA ER/PR positive, HER-2 positive invasive carcinoma of the upper inner quadrant of the right breast: Ms. Solorzano is continuing to recover from definitive treatment for breast cancer.  Today, she received a copy of her comprehensive survivorship care plan (SCP) which was reviewed  with her in detail.  The SCP details her cancer diagnosis, treatment course, potential late/long-term effects of treatments appropriate follow-up care with recommendations for future, and patient education resources.  A copy of this summary, along with a letter will be sent to the patient's primary care provider via mail/backslash in basket after today's visit. We encouraged her to continue to follow-up with her health care team to continue to monitor help and manage any effects of treatment.  We discussed that there is a chance for the cancer can come back.  The vast majority of recurrences will be detected in the 2-3 years after treatment though late recurrences can occur.  The recommended surveillance for follow-up after diagnosis was reviewed in detail but depending on risk, we generally recommend follow up with breast exam every 3-6 months for the first 2 years then every 6-12 months for 3-5 years then annually thereafter.  We discussed that the goal of surveillance after  treatment is to detect disease if it returns as early as possible.  The most useful tools for detecting recurrence include a thorough evaluation of symptoms and a physical exam which includes a complete breast exam and yearly mammograms or other imaging studies. Additional imaging may be considered based on symptoms or examination findings which is why is it is important to report concerns to her health care team.  Blood work may be performed at these visits for if there are symptoms or exam findings concerning for recurrence.  Patient was advised that if she feels that something is not right she should schedule an appointment to be evaluated.  Concerning symptoms discussed in detail.   Ms. Hackenberg will return to the survivorship clinic as needed and continue scheduled followups with her healthcare team as outlined in her SCP.   2. Survivorship Care Survey: Survivorship Care Survey completed post-treatment as recommended by the [facility] Survivorship Guidelines. Patient's answers were reviewed. Based on these, as well as the type of cancer and treatment she underwent, the following Survivorship Topics were discussed:    -Anxiety, Depression, and Distress-denies -Cognitive Function-denies -Fatigue-denies -Lymphedema- Complains of this. Saw Maureen Dupreez on 8/18 for OT. Encouraged continued follow up.  -Hormone-Related Symptoms-previously suffered hot flashes and joint pain with letrozole which was discontinued.  Tolerating anastrozole well without significant side effects. -Pain- intermittent 3/10 in right arm and breast with heaviness/fullness since surgery.  -Sexual Function-denies -Sleep Disorder-denies -Healthy Lifestyle-see below -Immunizations and Infections-I encouraged vaccinations including shingles, pneumonia, COVID-19, and annual flu.  By her report she is up-to-date on her vaccinations.  Some common side effects of treatment may include postsurgical pain, fatigue, lymphedema, menopause,  fluid retention, weight gain, changes in sexuality, pain with intercourse, vaginal dryness, depression/anxiety, and numbness/tingling.  3. Port-a-Cath: Patient had port placed for administration of chemotherapy on 09/11/2018 by Dr. Lucky Cowboy and removed 12/2019.  4. Tobacco Avoidance: I commended Ms. Carino's continued efforts to remain tobacco-free.  We discussed that one of the most important risk reduction strategies in preventing cancer recurrence is tobacco avoidance. She is committed to abstaining from tobacco.   5. Cancer screening/Health & Wellness Promotion:  Due to Ms. Griffin's history and her age, she should receive screening for skin cancers, colon cancer, and gynecologic cancers. The information and recommendations are listed on the patient's comprehensive care plan/treatment summary and were reviewed in detail with the patient.  I encouraged her to speak with her PCP about arranging these and performing annual wellness exams, as appropriate.    Colorectal cancer-for the average  risk patient, screening for colon cancer is recommended beginning at age 63.  Various screening strategies exist including colonoscopy, sigmoidoscopy, stool testing for blood, etc.  You can discuss these with your primary care provider to determine which option is best for you.  6. Bone density screening- bone mineral density testing is typically recommended after 39 or younger women with medical conditions or use of medications associated with low bone mass or bone loss. Patient taking anastrozole and at increased risk of bone loss. I encouraged additional supplementation of calcium 1200 mg and vitamin D [phone] IU daily taken with food to enhance absorption. I also encouraged weightbearing exercise as tolerated.  7. Physical activity/Healthy eating: Getting adequate physical activity and maintaining a healthy diet as a cancer survivor is important for overall wellness and reduces the risk of cancer recurrence.   We  reviewed the "Nutrition Rainbow" handout, as well as the handout "Take Control of Your Health and Reduce Your Cancer Risk" from the [place].  She was also encouraged to engage in moderate to vigorous exercise for 30 minutes per day most days of the week. We discussed the [employer] fitness program, which is designed for cancer survivors to help them become more physically fit after cancer treatments. We also discussed the CARE program which is offered 2 days a week at [facility] without cost to cancer survivors. At this time she declines referral to the CARE program but should she change her mind, a referral can be placed in Epic by entering the order 'Referral to CARE' at [facility].  I encouraged continued follow-up with occupational therapy We also discussed the importance and health benefits of maintaining a healthy weight and eating a balanced diet. Ms. Noyes was encouraged to consume 5-7 servings of fruits and vegetables per day. We discussed that a healthy BMI is 18.5-24.9 and that maintaining a health weight reduces risk of cancer recurrences.   8. Support services/Counseling: Ms. Tschetter was seen today in in effort to address both the physical and social concerns of our cancer survivors at [facility]. It is not uncommon for this period of the patient's cancer care trajectory to be one of many emotions and stressors.  I provided support today through active listening, validation of concerns, and expressive supportive counseling.  Ms. Groesbeck was encouraged to take advantage of our support services programs and support groups to better cope in her new life as a cancer survivor after completing anti-cancer treatment. We also discussed Counseling Services available to Cancer Survivors with [facility]. She declines need at this time.   Dispo:  - Return to [facility] for follow-up with Medical Oncology, Dr. Grayland Ormond on 10/08/2020 as scheduled - Return to survivorship clinic as  needed; no additional follow-up needed at this time.  -Consider transitioning the patient to long-term survivorship, when clinically appropriate.   I discussed the assessment and treatment plan with the patient. The patient was provided an opportunity to ask questions and all were answered. The patient agreed with the plan and demonstrated an understanding of the instructions.   The patient was advised to call back or seek an in-person evaluation if the symptoms worsen or if the condition fails to improve as anticipated.   I provided 20 minutes of face-to-face video visit time during this encounter, and > 50% was spent counseling as documented under my assessment & plan.  Beckey Rutter, DNP, AGNP-C [place] [facility]  Note: [place] Perrin Maltese, MD  [phone]

## 2020-04-28 ENCOUNTER — Inpatient Hospital Stay: Payer: 59 | Admitting: Occupational Therapy

## 2020-05-05 ENCOUNTER — Other Ambulatory Visit: Payer: Self-pay

## 2020-05-05 DIAGNOSIS — Z170 Estrogen receptor positive status [ER+]: Secondary | ICD-10-CM

## 2020-05-05 NOTE — Addendum Note (Signed)
Addended by: Lesly Rubenstein on: 05/05/2020 04:41 PM   Modules accepted: Orders

## 2020-05-12 ENCOUNTER — Inpatient Hospital Stay: Payer: 59 | Admitting: Occupational Therapy

## 2020-05-29 IMAGING — NM NM CARDIA MUGA REST
6 series · 38 of 38 positions shown · non-contrast
Comparison: MUGA scan 06/30/2029, 03/28/2019

CLINICAL DATA: Breast cancer. Evaluate cardiac function in relation
to chemotherapy.

EXAM:
NUCLEAR MEDICINE CARDIAC BLOOD POOL IMAGING (MUGA)
TECHNIQUE: Cardiac multi-gated acquisition was performed at rest following
intravenous injection of 1c-00m labeled red blood cells.
RADIOPHARMACEUTICALS:  21.7 mCi 1c-00m pertechnetate in-vitro
labeled red blood cells IV

[Series 1000: 45 lao-gated (results) · 3.30mm/px · 6 of 24 frames shown]
[frame 3/24]
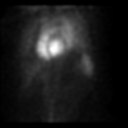
[frame 7/24]
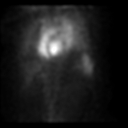
[frame 11/24]
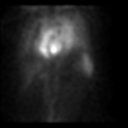
[frame 15/24]
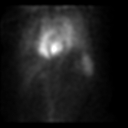
[frame 19/24]
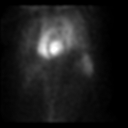
[frame 23/24]
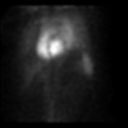

[Series 1000: 45 lao-gated (original with roi) · 3.30mm/px · 6 of 24 frames shown]
[frame 3/24]
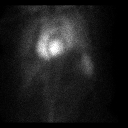
[frame 7/24]
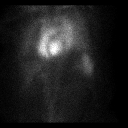
[frame 11/24]
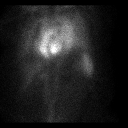
[frame 15/24]
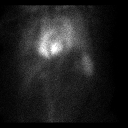
[frame 19/24]
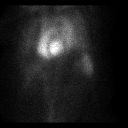
[frame 23/24]
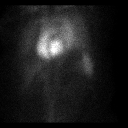

[Series 1000: 70 degree-gated · 3.30mm/px · 6 of 24 frames shown]
[frame 3/24]
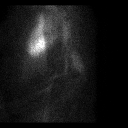
[frame 7/24]
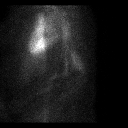
[frame 11/24]
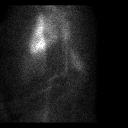
[frame 15/24]
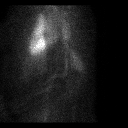
[frame 19/24]
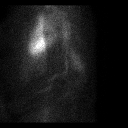
[frame 23/24]
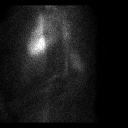

[Series 1000: anterior-gated · 3.30mm/px · 6 of 24 frames shown]
[frame 3/24]
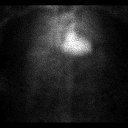
[frame 7/24]
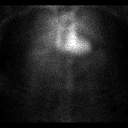
[frame 11/24]
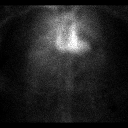
[frame 15/24]
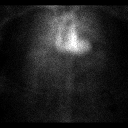
[frame 19/24]
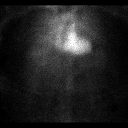
[frame 23/24]
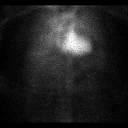

[Series 1000: 45 lao-gated (functional) · 3.30mm/px · 8 of 8 slices shown]
[im 1/8]
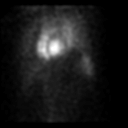
[im 2/8]
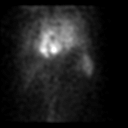
[im 3/8]
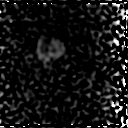
[im 4/8  full-range]
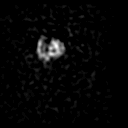
[im 5/8  full-range]
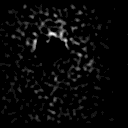
[im 6/8  full-range]
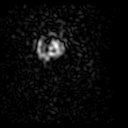
[im 7/8]
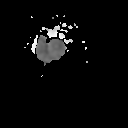
[im 8/8]
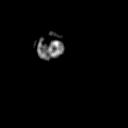

[Series 1000: 45 lao-gated · 3.30mm/px · 6 of 24 frames shown]
[frame 3/24]
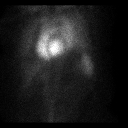
[frame 7/24]
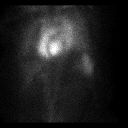
[frame 11/24]
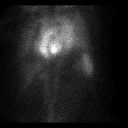
[frame 15/24]
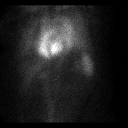
[frame 19/24]
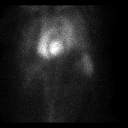
[frame 23/24]
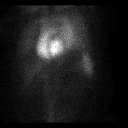

[38 of 38 positions shown; findings below may reference images not displayed]

FINDINGS: No  focal wall motion abnormality of the left ventricle.

Calculated left ventricular ejection fraction equals 54.7 %. This
compares with most recent ejection fracture equals 58.2%
(07/01/2019)
IMPRESSION: Left ventricular ejection fraction equals 54.7 %.

## 2020-06-18 ENCOUNTER — Encounter: Payer: Self-pay | Admitting: Nurse Practitioner

## 2020-10-08 ENCOUNTER — Inpatient Hospital Stay: Payer: 59 | Attending: Oncology | Admitting: Oncology

## 2020-10-08 VITALS — BP 134/84 | HR 84 | Temp 97.6°F | Resp 16 | Wt 202.2 lb

## 2020-10-08 DIAGNOSIS — Z79811 Long term (current) use of aromatase inhibitors: Secondary | ICD-10-CM | POA: Diagnosis not present

## 2020-10-08 DIAGNOSIS — C50211 Malignant neoplasm of upper-inner quadrant of right female breast: Secondary | ICD-10-CM | POA: Diagnosis not present

## 2020-10-08 DIAGNOSIS — Z170 Estrogen receptor positive status [ER+]: Secondary | ICD-10-CM

## 2020-10-08 DIAGNOSIS — M8580 Other specified disorders of bone density and structure, unspecified site: Secondary | ICD-10-CM | POA: Insufficient documentation

## 2020-10-08 NOTE — Progress Notes (Signed)
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  INTERVAL HISTORY: Patient returns to clinic today for routine 52-monthevaluation. She was last seen in clinic on 04/07/2020. In the interim she met with MRosalyn Gessfor evaluation for lymphedema. Was recommended she use an OTC glove and sleeve with high risk activities such as cleaning house and vacuuming.  Today, she feels well and is asymptomatic. She is tolerating anastrozole without significant side effects.  States right breast seroma appears to be returning.  It is offering mild discomfort especially when she is not wearing supportive enough bras.  She denies any nipple discharge.  She has no neurologic complaints.  She denies any recent fevers or illnesses.  She has a good appetite and denies weight loss.  She denies any chest pain, shortness of breath, cough, or hemoptysis.  She denies any nausea, vomiting, constipation, or diarrhea.  She has no urinary complaints.  Patient offers no further specific complaints today.  Patient offers no further specific complaints today.  REVIEW OF SYSTEMS:   Review of Systems  Constitutional: Negative.  Negative for fever, malaise/fatigue and weight loss.  Respiratory: Negative.  Negative for cough, hemoptysis and shortness of breath.   Cardiovascular: Negative.  Negative for chest pain and leg swelling.  Gastrointestinal: Negative.  Negative for abdominal  pain, blood in stool and melena.  Genitourinary: Negative.  Negative for dysuria.  Musculoskeletal: Negative.  Negative for back pain.  Skin: Negative.  Negative for rash.  Neurological: Negative.  Negative for seizures, weakness and headaches.  Psychiatric/Behavioral: Negative.  The patient is not nervous/anxious.     As per HPI. Otherwise, a complete review of systems is negative.  PAST MEDICAL HISTORY: Past Medical History:  Diagnosis Date  . Anginal pain ([place]   . Arthritis   . Breast cancer ([place]   . Carpal tunnel syndrome of right wrist   . Coronary atherosclerosis of unspecified type of vessel, native or graft   . Hand and foot pain   . HER2-positive carcinoma of breast ([place]   . Hyperlipidemia   . Hypertension   . MVP (mitral valve prolapse)   . Personal history of chemotherapy   . Personal history of radiation therapy   . Sleep apnea   . Vertigo     PAST SURGICAL HISTORY: Past Surgical History:  Procedure Laterality Date  . BREAST BIOPSY Right 08/27/2018   [facility]  . BREAST LUMPECTOMY Right 09/11/2018  . BREAST LUMPECTOMY WITH NEEDLE LOCALIZATION AND AXILLARY SENTINEL LYMPH NODE BX Right 09/11/2018   Procedure: BREAST LUMPECTOMY WITH NEEDLE LOCALIZATION AND SENTINEL LYMPH NODE BX;  Surgeon: DVickie Epley MD;  Location: [facility];  Service: General;  Laterality: Right;  . CARDIAC CATHETERIZATION    . CATARACT EXTRACTION W/PHACO Left 01/01/2017   Procedure: CATARACT EXTRACTION PHACO AND INTRAOCULAR LENS PLACEMENT (IOC)left Restor lens;  Surgeon: BLeandrew Koyanagi MD;  [place]  Service: Ophthalmology;  Laterality: Left;  Restor lens  . CATARACT EXTRACTION W/PHACO Right 01/31/2017   Procedure: CATARACT EXTRACTION  PHACO AND INTRAOCULAR LENS PLACEMENT (IOC);  Surgeon: Leandrew Koyanagi, MD;  Location[place];  Service: Ophthalmology;  Laterality: Right;  . CESAREAN SECTION    . CHOLECYSTECTOMY    . HYSTEROSCOPY  06/2010    D&C  . PORTACATH PLACEMENT Right 09/11/2018   Procedure: INSERTION PORT-A-CATH;  Surgeon: [name], MD;  Location: [facility];  Service: General;  Laterality: Right;  . TUBAL LIGATION      FAMILY HISTORY: Family History  Problem Relation Age of Onset  . Hypertension Mother   . Cancer Mother 75       LUNG  . Hypertension Father   . Colon cancer Paternal Uncle   . Breast cancer Neg Hx     ADVANCED DIRECTIVES (Y/N):  N  HEALTH MAINTENANCE: Social History   Tobacco Use  . Smoking status: Never Smoker  . Smokeless tobacco: Never Used  Vaping Use  . Vaping Use: Never used  Substance Use Topics  . Alcohol use: Yes    Alcohol/week: 3.0 standard drinks    Types: 3 Shots of liquor per week    Comment: social drinker  . Drug use: No     Colonoscopy:  PAP:  Bone density:  Lipid panel:  No Known Allergies  Current Outpatient Medications  Medication Sig Dispense Refill  . amLODipine-benazepril (LOTREL) 10-20 MG per capsule Take 1 capsule by mouth daily.     Marland Kitchen anastrozole (ARIMIDEX) 1 MG tablet TAKE ONE TABLET EVERY DAY 90 tablet 3  . diclofenac (VOLTAREN) 75 MG EC tablet Take 1 tablet (75 mg total) by mouth 2 (two) times daily. 60 tablet 0  . fluticasone (FLONASE) 50 MCG/ACT nasal spray Place 2 sprays into both nostrils daily as needed for allergies.     Marland Kitchen LORazepam (ATIVAN) 0.5 MG tablet Take 0.5 mg by mouth as needed.     Marland Kitchen omeprazole (PRILOSEC) 40 MG capsule Take 40 mg by mouth daily.    Marland Kitchen venlafaxine XR (EFFEXOR-XR) 75 MG 24 hr capsule Take 75 mg by mouth daily with breakfast.      Current Facility-Administered Medications  Medication Dose Route Frequency Provider Last Rate Last Admin  . betamethasone acetate-betamethasone sodium phosphate (CELESTONE) injection 3 mg  3 mg Intramuscular Once Edrick Kins, DPM        OBJECTIVE: Vitals:   10/08/20 1311  BP: 134/84  Pulse: 84  Resp: 16  Temp: 97.6 F (36.4 C)  SpO2: 98%     Body mass index is 32.64 kg/m.     ECOG FS:0 - Asymptomatic  Physical Exam Constitutional:      General: Vital signs are normal.     Appearance: Normal appearance.  HENT:     Head: Normocephalic and atraumatic.  Eyes:     Pupils: Pupils are equal, round, and reactive to light.  Cardiovascular:     Rate and Rhythm: Normal rate and regular rhythm.     Heart sounds: Normal heart sounds. No murmur heard.   Pulmonary:     Effort: Pulmonary effort is normal.     Breath sounds: Normal breath sounds. No wheezing.  Abdominal:     General: Bowel sounds are normal. There is no distension.     Palpations: Abdomen is soft.     Tenderness: There is no abdominal tenderness.  Musculoskeletal:        General: No edema. Normal range of motion.     Cervical back: Normal range of motion.  Skin:    General: Skin is  warm and dry.     Findings: No rash.  Neurological:     Mental Status: She is alert and oriented to person, place, and time.  Psychiatric:        Judgment: Judgment normal.    LAB RESULTS:  Lab Results  Component Value Date   NA 137 04/07/2020   K 3.9 04/07/2020   CL 101 04/07/2020   CO2 26 04/07/2020   GLUCOSE 117 (H) 04/07/2020   BUN 14 04/07/2020   CREATININE 0.72 04/07/2020   CALCIUM 9.2 04/07/2020   PROT 8.2 (H) 04/07/2020   ALBUMIN 3.7 04/07/2020   AST 22 04/07/2020   ALT 18 04/07/2020   ALKPHOS 117 04/07/2020   BILITOT 0.5 04/07/2020   GFRNONAA >60 04/07/2020   GFRAA >60 04/07/2020    Lab Results  Component Value Date   WBC 7.8 04/07/2020   NEUTROABS 4.1 04/07/2020   HGB 14.2 04/07/2020   HCT 41.7 04/07/2020   MCV 89.1 04/07/2020   PLT 395 04/07/2020     STUDIES: No results found.  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 -Completed  12 cycles of Herceptin plus Taxol on 12/26/2018. -Completed year-long maintenance Herceptin on 12/24/2019. -She also had adjuvant XRT. -She was started on anastrozole which she will complete in July 2025. -Most recent mammogram is from 12/12/2019 showed seroma which was aspirated. -Unfortunately seroma has slowly returned and is causing mild discomfort.  I have recommended she reach back out to her surgeon. -We will repeat mammogram in March/April 2022.  2.  Osteopenia:  -Baseline bone density from 04/01/2019 revealed a T score of -1.6. -Repeat bone density on 04/14/2020 shows a T score of -1.8. -Continue calcium and vitamin D.  Disposition: -Follow-up with surgeon for recurrence of seroma to right breast. -Schedule mammogram for April 2022 -RTC in 6 months for repeat labs and results of mammogram along with MD assessment.    Patient expressed understanding and was in agreement with this plan. She also understands that She can call clinic at any time with any questions, concerns, or complaints.   Cancer Staging Malignant neoplasm of upper-inner quadrant of right breast in female, estrogen receptor positive ([place]) Staging form: Breast, AJCC 8th Edition - Clinical stage from 09/06/2018: Stage IA (cT1c, cN0, cM0, G3, ER+, PR+, HER2+) - Signed by Lloyd Huger, MD on 09/06/2018 Histologic grading system: 3 grade system Laterality: Right   Jacquelin Hawking, NP   10/08/2020 1:18 PM

## 2020-10-26 ENCOUNTER — Ambulatory Visit: Payer: Self-pay | Admitting: Cardiovascular Disease

## 2020-10-26 ENCOUNTER — Other Ambulatory Visit: Payer: Self-pay | Admitting: Cardiovascular Disease

## 2020-10-29 ENCOUNTER — Other Ambulatory Visit
Admission: RE | Admit: 2020-10-29 | Discharge: 2020-10-29 | Disposition: A | Discharge status: Home / Self Care | Payer: 59 | Source: Ambulatory Visit | Attending: Cardiovascular Disease | Admitting: Cardiovascular Disease

## 2020-10-29 ENCOUNTER — Other Ambulatory Visit: Payer: Self-pay

## 2020-10-29 DIAGNOSIS — Z01812 Encounter for preprocedural laboratory examination: Secondary | ICD-10-CM | POA: Insufficient documentation

## 2020-10-29 DIAGNOSIS — Z20822 Contact with and (suspected) exposure to covid-19: Secondary | ICD-10-CM | POA: Diagnosis not present

## 2020-10-29 LAB — SARS CORONAVIRUS 2 (TAT 6-24 HRS): SARS Coronavirus 2: NEGATIVE

## 2020-11-02 ENCOUNTER — Ambulatory Visit: Admit: 2020-11-02 | Payer: 59 | Admitting: Cardiovascular Disease

## 2020-11-02 ENCOUNTER — Encounter: Payer: Self-pay | Admitting: Cardiovascular Disease

## 2020-11-02 ENCOUNTER — Other Ambulatory Visit: Payer: Self-pay

## 2020-11-02 ENCOUNTER — Ambulatory Visit
Admission: RE | Admit: 2020-11-02 | Discharge: 2020-11-02 | Disposition: A | Discharge status: Home / Self Care | Payer: 59 | Attending: Cardiovascular Disease | Admitting: Cardiovascular Disease

## 2020-11-02 ENCOUNTER — Encounter
Admission: RE | Disposition: A | Discharge status: Home / Self Care | Payer: Self-pay | Source: Home / Self Care | Attending: Cardiovascular Disease

## 2020-11-02 ENCOUNTER — Ambulatory Visit
Admission: RE | Admit: 2020-11-02 | Discharge: 2020-11-02 | Disposition: A | Discharge status: Home / Self Care | Payer: 59 | Source: Home / Self Care | Attending: Cardiovascular Disease | Admitting: Cardiovascular Disease

## 2020-11-02 DIAGNOSIS — I10 Essential (primary) hypertension: Secondary | ICD-10-CM | POA: Diagnosis present

## 2020-11-02 DIAGNOSIS — I340 Nonrheumatic mitral (valve) insufficiency: Secondary | ICD-10-CM | POA: Diagnosis not present

## 2020-11-02 DIAGNOSIS — Q210 Ventricular septal defect: Secondary | ICD-10-CM | POA: Insufficient documentation

## 2020-11-02 HISTORY — PX: TEE WITHOUT CARDIOVERSION: SHX5443

## 2020-11-02 SURGERY — ECHOCARDIOGRAM, TRANSESOPHAGEAL
Anesthesia: Moderate Sedation

## 2020-11-02 SURGERY — ECHOCARDIOGRAM, TRANSESOPHAGEAL
Anesthesia: Moderate Sedation | Laterality: Right

## 2020-11-02 MED ORDER — SODIUM CHLORIDE FLUSH 0.9 % IV SOLN
INTRAVENOUS | Status: AC
  Filled 2020-11-02: qty 10

## 2020-11-02 MED ORDER — MIDAZOLAM HCL 5 MG/5ML IJ SOLN
INTRAMUSCULAR | Status: AC | PRN
  Administered 2020-11-02 (×2): 2 mg via INTRAVENOUS
  Administered 2020-11-02: 1 mg via INTRAVENOUS

## 2020-11-02 MED ORDER — MIDAZOLAM HCL 5 MG/5ML IJ SOLN
INTRAMUSCULAR | Status: AC
  Filled 2020-11-02: qty 5

## 2020-11-02 MED ORDER — LIDOCAINE VISCOUS HCL 2 % MT SOLN
OROMUCOSAL | Status: AC
  Filled 2020-11-02: qty 15

## 2020-11-02 MED ORDER — FENTANYL CITRATE (PF) 100 MCG/2ML IJ SOLN
INTRAMUSCULAR | Status: AC
  Filled 2020-11-02: qty 2

## 2020-11-02 MED ORDER — SODIUM CHLORIDE 0.9 % IV SOLN: INTRAVENOUS | Status: DC

## 2020-11-02 MED ORDER — FENTANYL CITRATE (PF) 100 MCG/2ML IJ SOLN
INTRAMUSCULAR | Status: AC | PRN
  Administered 2020-11-02: 50 ug via INTRAVENOUS
  Administered 2020-11-02 (×2): 25 ug via INTRAVENOUS

## 2020-11-02 NOTE — Progress Notes (Signed)
*  PRELIMINARY RESULTS* Echocardiogram Echocardiogram Transesophageal has been performed.  Erin Church 11/02/2020, 8:32 AM

## 2020-11-04 ENCOUNTER — Encounter: Payer: Self-pay | Admitting: Cardiovascular Disease

## 2020-11-12 ENCOUNTER — Other Ambulatory Visit: Payer: Self-pay

## 2020-11-12 ENCOUNTER — Ambulatory Visit
Admission: RE | Admit: 2020-11-12 | Discharge: 2020-11-12 | Disposition: A | Discharge status: Home / Self Care | Payer: 59 | Source: Ambulatory Visit | Attending: Oncology | Admitting: Oncology

## 2020-11-12 DIAGNOSIS — Z170 Estrogen receptor positive status [ER+]: Secondary | ICD-10-CM | POA: Diagnosis present

## 2020-11-12 DIAGNOSIS — C50211 Malignant neoplasm of upper-inner quadrant of right female breast: Secondary | ICD-10-CM | POA: Diagnosis not present

## 2021-02-04 ENCOUNTER — Other Ambulatory Visit: Payer: Self-pay | Admitting: Oncology

## 2021-04-09 NOTE — Progress Notes (Deleted)
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  INTERVAL HISTORY: Patient returns to clinic today for routine 59-monthevaluation.  She currently feels well and is asymptomatic. She is tolerating anastrozole without significant side effects.  She has no neurologic complaints.  She denies any recent fevers or illnesses.  She has a good appetite and denies weight loss.  She denies any chest pain, shortness of breath, cough, or hemoptysis.  She denies any nausea, vomiting, constipation, or diarrhea.  She has no urinary complaints.  Patient offers no further specific complaints today.  Patient offers no further specific complaints today.  REVIEW OF SYSTEMS:   Review of Systems  Constitutional: Negative.  Negative for fever, malaise/fatigue and weight loss.  Respiratory: Negative.  Negative for cough, hemoptysis and shortness of breath.   Cardiovascular: Negative.  Negative for chest pain and leg swelling.  Gastrointestinal: Negative.  Negative for abdominal pain, blood in stool and melena.  Genitourinary: Negative.  Negative for dysuria.  Musculoskeletal: Negative.  Negative for back pain.  Skin: Negative.  Negative for rash.  Neurological: Negative.  Negative for seizures, weakness and headaches.  Psychiatric/Behavioral: Negative.  The patient is not nervous/anxious.    As per HPI. Otherwise, a complete review of systems is negative.  PAST MEDICAL  HISTORY: Past Medical History:  Diagnosis Date   Anginal pain ([facility]    Arthritis    Breast cancer ([place]    Carpal tunnel syndrome of right wrist    Coronary atherosclerosis of unspecified type of vessel, native or graft    Hand and foot pain    HER2-positive carcinoma of breast ([facility])    Hyperlipidemia    Hypertension    MVP (mitral valve prolapse)    Personal history of chemotherapy    Personal history of radiation therapy    Sleep apnea    Vertigo     PAST SURGICAL HISTORY: Past Surgical History:  Procedure Laterality Date   [facility]  BREAST LUMPECTOMY Right 09/11/2018   BREAST LUMPECTOMY WITH NEEDLE LOCALIZATION AND AXILLARY SENTINEL LYMPH NODE BX Right 09/11/2018   Procedure: BREAST LUMPECTOMY WITH NEEDLE LOCALIZATION AND SENTINEL LYMPH NODE BX;  Surgeon: DVickie Epley MD;  Location: [facility];  Service: General;  Laterality: Right;   CARDIAC CATHETERIZATION     CATARACT EXTRACTION W/PHACO Left 01/01/2017   Procedure: CATARACT EXTRACTION PHACO AND INTRAOCULAR LENS PLACEMENT (IOC)left Restor lens;  Surgeon: BLeandrew Koyanagi MD;  [place]  Service: Ophthalmology;  Laterality: Left;  Restor lens   CATARACT EXTRACTION W/PHACO Right 01/31/2017   Procedure: CATARACT EXTRACTION PHACO AND INTRAOCULAR LENS PLACEMENT (IOC);  Surgeon: BLeandrew Koyanagi MD;  [place]  Service: Ophthalmology;  Laterality: Right;   CESAREAN SECTION     CHOLECYSTECTOMY     HYSTEROSCOPY  06/2010   D&C   PORTACATH PLACEMENT Right 09/11/2018   Procedure: INSERTION PORT-A-CATH;  Surgeon: DVickie Epley MD;  Location: [facility];  Service: General;  Laterality: Right;  TEE WITHOUT CARDIOVERSION Right 11/02/2020   Procedure: TRANSESOPHAGEAL ECHOCARDIOGRAM (TEE);  Surgeon: Dionisio David, MD;  Location: [facility];  Service: Cardiovascular;  Laterality: Right;   TUBAL LIGATION      FAMILY HISTORY: Family History  Problem  Relation Age of Onset   Hypertension Mother    Cancer Mother 12       LUNG   Hypertension Father    Colon cancer Paternal Uncle    Breast cancer Neg Hx     ADVANCED DIRECTIVES (Y/N):  N  HEALTH MAINTENANCE: Social History   Tobacco Use   Smoking status: Never   Smokeless tobacco: Never  Vaping Use   Vaping Use: Never used  Substance Use Topics   Alcohol use: Yes    Alcohol/week: 3.0 standard drinks    Types: 3 Shots of liquor per week    Comment: social drinker   Drug use: No     Colonoscopy:  PAP:  Bone density:  Lipid panel:  No Known Allergies  Current Outpatient Medications  Medication Sig Dispense Refill   acetaminophen (TYLENOL) 500 MG tablet Take 500-1,000 mg by mouth every 6 (six) hours as needed (pain.).     amLODipine-benazepril (LOTREL) 10-20 MG per capsule Take 1 capsule by mouth daily.      anastrozole (ARIMIDEX) 1 MG tablet TAKE 1 TABLET BY MOUTH DAILY 90 tablet 3   celecoxib (CELEBREX) 200 MG capsule Take 200 mg by mouth daily as needed (knee pain.).     fluticasone (FLONASE) 50 MCG/ACT nasal spray Place 2 sprays into both nostrils daily as needed.     loratadine (CLARITIN) 10 MG tablet Take 10 mg by mouth daily.     LORazepam (ATIVAN) 0.5 MG tablet Take 0.5 mg by mouth 2 (two) times daily as needed for anxiety.     omeprazole (PRILOSEC) 40 MG capsule Take 40 mg by mouth daily.     venlafaxine XR (EFFEXOR-XR) 75 MG 24 hr capsule Take 75 mg by mouth daily with breakfast.      Current Facility-Administered Medications  Medication Dose Route Frequency Provider Last Rate Last Admin   betamethasone acetate-betamethasone sodium phosphate (CELESTONE) injection 3 mg  3 mg Intramuscular Once Edrick Kins, DPM        OBJECTIVE: There were no vitals filed for this visit.    There is no height or weight on file to calculate BMI.    ECOG FS:0 - Asymptomatic  General: Well-developed, well-nourished, no acute distress. Eyes: Pink conjunctiva, anicteric  sclera. HEENT: Normocephalic, moist mucous membranes. Breasts: Exam recently performed by another provider. Lungs: No audible wheezing or coughing. Heart: Regular rate and rhythm. Abdomen: Soft, nontender, no obvious distention. Musculoskeletal: No edema, cyanosis, or clubbing. Neuro: Alert, answering all questions appropriately. Cranial nerves grossly intact. Skin: No rashes or petechiae noted. Psych: Normal affect.  LAB RESULTS:  Lab Results  Component Value Date   NA 137 04/07/2020   K 3.9 04/07/2020   CL 101 04/07/2020   CO2 26 04/07/2020   GLUCOSE 117 (H) 04/07/2020   BUN 14 04/07/2020   CREATININE 0.72 04/07/2020   CALCIUM 9.2 04/07/2020   PROT 8.2 (H) 04/07/2020   ALBUMIN 3.7 04/07/2020   AST 22 04/07/2020   ALT 18 04/07/2020   ALKPHOS 117 04/07/2020   BILITOT 0.5 04/07/2020   GFRNONAA >60 04/07/2020   GFRAA >60 04/07/2020    Lab Results  Component Value Date   WBC 7.8 04/07/2020   NEUTROABS 4.1 04/07/2020   HGB 14.2  04/07/2020   HCT 41.7 04/07/2020   MCV 89.1 04/07/2020   PLT 395 04/07/2020     STUDIES: No results found.  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  She also had port placement at the same time.  She completed 12 weekly cycles of Herceptin plus Taxol on December 26, 2018 and then completed her year-long maintenance Kanjinti (bio similar to Herceptin) on December 24, 2019.  She also completed adjuvant XRT.  Continue anastrozole for a total of 5 years completing treatment in July 2025.  Patient's most recent breast imaging was an MRI completed on Dec 29, 2019 that revealed no evidence of recurrent or progressive disease.  Repeat mammogram in May 2022.  Return to clinic  in 6 months for routine evaluation.   2.  Osteopenia: Patient had a baseline bone mineral density on April 01, 2019 that revealed a T score of -1.6.  Repeat bone mineral density in the next 1 to 2 weeks.  Patient has been instructed to increase her calcium and vitamin D intake. 3.  Hot flashes/joint pain: Resolved since discontinuing letrozole.  Anastrozole as above. 4.  Nipple discharge: Resolved.  Follow-up with surgery as needed.   Patient expressed understanding and was in agreement with this plan. She also understands that She can call clinic at any time with any questions, concerns, or complaints.   Cancer Staging Malignant neoplasm of upper-inner quadrant of right breast in female, estrogen receptor positive ([place]) Staging form: Breast, AJCC 8th Edition - Clinical stage from 09/06/2018: Stage IA (cT1c, cN0, cM0, G3, ER+, PR+, HER2+) - Signed by Lloyd Huger, MD on 09/06/2018 Histologic grading system: 3 grade system Laterality: Right   Lloyd Huger, MD   04/09/2021 10:14 AM

## 2021-04-14 ENCOUNTER — Inpatient Hospital Stay: Payer: 59

## 2021-04-14 ENCOUNTER — Inpatient Hospital Stay: Payer: 59 | Admitting: Oncology

## 2021-04-14 DIAGNOSIS — C50211 Malignant neoplasm of upper-inner quadrant of right female breast: Secondary | ICD-10-CM

## 2021-06-06 ENCOUNTER — Encounter: Payer: Self-pay | Admitting: General Surgery

## 2021-06-13 NOTE — Progress Notes (Signed)
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  INTERVAL HISTORY: Patient returns to clinic today for routine 11-month evaluation.  She continues to feel well and remains asymptomatic.  She continues to tolerate anastrozole without significant side effects. She has no neurologic complaints.  She denies any recent fevers or illnesses.  She has a good appetite and denies weight loss.  She denies any chest pain, shortness of breath, cough, or hemoptysis.  She denies any nausea, vomiting, constipation, or diarrhea.  She has no urinary complaints.  Patient offers no further specific complaints today.  Patient feels at her baseline offers no specific complaints today.  REVIEW OF SYSTEMS:   Review of Systems  Constitutional: Negative.  Negative for fever, malaise/fatigue and weight loss.  Respiratory: Negative.  Negative for cough, hemoptysis and shortness of breath.   Cardiovascular: Negative.  Negative for chest pain and leg swelling.  Gastrointestinal: Negative.  Negative for abdominal pain, blood in stool and melena.  Genitourinary: Negative.  Negative for dysuria.  Musculoskeletal: Negative.  Negative for back pain.  Skin: Negative.  Negative for rash.  Neurological: Negative.  Negative for seizures, weakness and headaches.  Psychiatric/Behavioral: Negative.  The patient is not nervous/anxious.    As per HPI. Otherwise, a complete review of systems is  negative.  PAST MEDICAL HISTORY: Past Medical History:  Diagnosis Date   Anginal pain ([place])    Arthritis    Breast cancer ([place])    Carpal tunnel syndrome of right wrist    Coronary atherosclerosis of unspecified type of vessel, native or graft    Hand and foot pain    HER2-positive carcinoma of breast (H[facility])    Hyperlipidemia    Hypertension    MVP (mitral valve prolapse)    Personal history of chemotherapy    Personal history of radiation therapy    Sleep apnea    Vertigo     PAST SURGICAL HISTORY: Past Surgical History:  Procedure Laterality Date   BREAST BIOPSY Right 08/27/2018   [place] bx, [facility]   BREAST LUMPECTOMY Right 09/11/2018   BREAST LUMPECTOMY WITH NEEDLE LOCALIZATION AND AXILLARY SENTINEL LYMPH NODE BX Right 09/11/2018   Procedure: BREAST LUMPECTOMY WITH NEEDLE LOCALIZATION AND SENTINEL LYMPH NODE BX;  Surgeon: [name], MD;  Location: [facility];  Service: General;  Laterality: Right;   CARDIAC CATHETERIZATION     CATARACT EXTRACTION W/PHACO Left 01/01/2017   Procedure: CATARACT EXTRACTION PHACO AND INTRAOCULAR LENS PLACEMENT (IOC)left Restor lens;  Surgeon: Leandrew Koyanagi, MD;  Location: [place];  Service: Ophthalmology;  Laterality: Left;  Restor lens   CATARACT EXTRACTION W/PHACO Right 01/31/2017   Procedure: CATARACT EXTRACTION PHACO AND INTRAOCULAR LENS PLACEMENT (IOC);  Surgeon: Leandrew Koyanagi, MD;  Location: [place];  Service: Ophthalmology;  Laterality: Right;   CESAREAN SECTION     CHOLECYSTECTOMY     HYSTEROSCOPY  06/2010   D&C   PORTACATH PLACEMENT Right 09/11/2018   Procedure: INSERTION PORT-A-CATH;  Surgeon: [name], MD;  Location: [facility];  Service: General;  Laterality: Right;   TEE WITHOUT CARDIOVERSION Right 11/02/2020   Procedure: TRANSESOPHAGEAL ECHOCARDIOGRAM (TEE);  Surgeon: Laurier Nancy, MD;  Location: [facility];  Service: Cardiovascular;  Laterality: Right;   TUBAL LIGATION      FAMILY  HISTORY: Family History  Problem Relation Age of Onset   Hypertension Mother    Cancer Mother 31       LUNG   Hypertension Father    Colon cancer Paternal Uncle    Breast cancer Neg Hx     ADVANCED DIRECTIVES (Y/N):  N  HEALTH MAINTENANCE: Social History   Tobacco Use   Smoking status: Never   Smokeless tobacco: Never  Vaping Use   Vaping Use: Never used  Substance Use Topics   Alcohol use: Yes    Alcohol/week: 3.0 standard drinks    Types: 3 Shots of liquor per week    Comment: social drinker   Drug use: No     Colonoscopy:  PAP:  Bone density:  Lipid panel:  No Known Allergies  Current Outpatient Medications  Medication Sig Dispense Refill   acetaminophen (TYLENOL) 500 MG tablet Take 500-1,000 mg by mouth every 6 (six) hours as needed (pain.).     amLODipine-benazepril (LOTREL) 10-20 MG per capsule Take 1 capsule by mouth daily.      anastrozole (ARIMIDEX) 1 MG tablet TAKE 1 TABLET BY MOUTH DAILY 90 tablet 3   fluticasone (FLONASE) 50 MCG/ACT nasal spray Place 2 sprays into both nostrils daily as needed.     loratadine (CLARITIN) 10 MG tablet Take 10 mg by mouth daily.     omeprazole (PRILOSEC) 40 MG capsule Take 40 mg by mouth daily.     venlafaxine XR (EFFEXOR-XR) 75 MG 24 hr capsule Take 75 mg by mouth daily with breakfast.      celecoxib (CELEBREX) 200 MG capsule Take 200 mg by mouth daily as needed (knee pain.). (Patient not taking: Reported on 06/14/2021)     LORazepam (ATIVAN) 0.5 MG tablet Take 0.5 mg by mouth 2 (two) times daily as needed for anxiety. (Patient not taking: Reported on 06/14/2021)     Current Facility-Administered Medications  Medication Dose Route Frequency Provider Last Rate Last Admin   betamethasone acetate-betamethasone sodium phosphate (CELESTONE) injection 3 mg  3 mg Intramuscular Once Felecia Shelling, DPM        OBJECTIVE: Vitals:   06/14/21 0950  BP: 135/84  Pulse: 88  Resp: 18  Temp: 98.3 F (36.8 C)  SpO2: 99%     Body  mass index is 35.73 kg/m.    ECOG FS:0 - Asymptomatic  General: Well-developed, well-nourished, no acute distress. Eyes: Pink conjunctiva, anicteric sclera. HEENT: Normocephalic, moist mucous membranes. Breast: Exam deferred today. Lungs: No audible wheezing or coughing. Heart: Regular rate and rhythm. Abdomen: Soft, nontender, no obvious distention. Musculoskeletal: No edema, cyanosis, or clubbing. Neuro: Alert, answering all questions appropriately. Cranial nerves grossly intact. Skin: No rashes or petechiae noted. Psych: Normal affect.   LAB RESULTS:  Lab Results  Component Value Date   NA 133 (L) 06/14/2021   K 3.8 06/14/2021   CL 99 06/14/2021   CO2 25 06/14/2021   GLUCOSE 119 (H) 06/14/2021   BUN 11 06/14/2021   CREATININE 0.68 06/14/2021   CALCIUM 9.6 06/14/2021   PROT 8.9 (H) 06/14/2021   ALBUMIN 3.8 06/14/2021   AST 24 06/14/2021   ALT 17 06/14/2021   ALKPHOS 124 06/14/2021   BILITOT 0.3 06/14/2021   GFRNONAA >60 06/14/2021  GFRAA >60 04/07/2020    Lab Results  Component Value Date   WBC 8.1 06/14/2021   NEUTROABS 5.0 06/14/2021   HGB 13.7 06/14/2021   HCT 41.8 06/14/2021   MCV 86.4 06/14/2021   PLT 401 (H) 06/14/2021     STUDIES: No results found.  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  She also had port placement at the same time.  She completed 12 weekly cycles of Herceptin plus Taxol on December 26, 2018 and then completed her year-long maintenance Kanjinti (bio similar to Herceptin) on December 24, 2019.  She also completed adjuvant XRT.  Continue anastrozole for a total of 5 years completing treatment in July 2025.  Patient's most recent mammogram on November 12, 2020 was reported as BI-RADS 2.  Repeat in March 2023.  Return to clinic in 6 months for routine evaluation.  2.  Osteopenia: Patient's most recent bone mineral density on April 14, 2020 revealed a T score of -1.8.  This is slightly decreased from 1 year prior where the T score was reported -1.6.  Repeat bone mineral density in the next 1 to 2 weeks.  Continue calcium and vitamin D supplementation.   3.  Hot flashes/joint pain: Resolved since discontinuing letrozole.  Anastrozole as above. 4.  Seroma: Patient had drainage approximately March 2022.  No further intervention needed at this time.   Patient expressed understanding and was in agreement with this plan. She also understands that She can call clinic at any time with any questions, concerns, or complaints.   Cancer Staging Malignant neoplasm of upper-inner quadrant of right breast in female, estrogen receptor positive ([place]) Staging form: Breast, AJCC 8th Edition - Clinical stage from 09/06/2018: Stage IA (cT1c, cN0, cM0, G3, ER+, PR+, HER2+) - Signed by Lloyd Huger, MD on 09/06/2018 Histologic grading system: 3 grade system Laterality: Right   Lloyd Huger, MD   06/14/2021 10:27 AM

## 2021-06-14 ENCOUNTER — Inpatient Hospital Stay: Payer: 59 | Attending: Oncology

## 2021-06-14 ENCOUNTER — Other Ambulatory Visit: Payer: Self-pay

## 2021-06-14 ENCOUNTER — Inpatient Hospital Stay (HOSPITAL_BASED_OUTPATIENT_CLINIC_OR_DEPARTMENT_OTHER): Payer: 59 | Admitting: Oncology

## 2021-06-14 VITALS — BP 135/84 | HR 88 | Temp 98.3°F | Resp 18 | Wt 221.4 lb

## 2021-06-14 DIAGNOSIS — C50211 Malignant neoplasm of upper-inner quadrant of right female breast: Secondary | ICD-10-CM

## 2021-06-14 DIAGNOSIS — Z79811 Long term (current) use of aromatase inhibitors: Secondary | ICD-10-CM | POA: Diagnosis not present

## 2021-06-14 DIAGNOSIS — M8580 Other specified disorders of bone density and structure, unspecified site: Secondary | ICD-10-CM | POA: Insufficient documentation

## 2021-06-14 DIAGNOSIS — Z170 Estrogen receptor positive status [ER+]: Secondary | ICD-10-CM

## 2021-06-14 LAB — CBC WITH DIFFERENTIAL/PLATELET
Abs Immature Granulocytes: 0.02 10*3/uL (ref 0.00–0.07)
Basophils Absolute: 0.1 10*3/uL (ref 0.0–0.1)
Basophils Relative: 1 %
Eosinophils Absolute: 0.1 10*3/uL (ref 0.0–0.5)
Eosinophils Relative: 1 %
HCT: 41.8 % (ref 36.0–46.0)
Hemoglobin: 13.7 g/dL (ref 12.0–15.0)
Immature Granulocytes: 0 %
Lymphocytes Relative: 25 %
Lymphs Abs: 2 10*3/uL (ref 0.7–4.0)
MCH: 28.3 pg (ref 26.0–34.0)
MCHC: 32.8 g/dL (ref 30.0–36.0)
MCV: 86.4 fL (ref 80.0–100.0)
Monocytes Absolute: 0.8 10*3/uL (ref 0.1–1.0)
Monocytes Relative: 10 %
Neutro Abs: 5 10*3/uL (ref 1.7–7.7)
Neutrophils Relative %: 63 %
Platelets: 401 10*3/uL — ABNORMAL HIGH (ref 150–400)
RBC: 4.84 MIL/uL (ref 3.87–5.11)
RDW: 15.6 % — ABNORMAL HIGH (ref 11.5–15.5)
WBC: 8.1 10*3/uL (ref 4.0–10.5)
nRBC: 0 % (ref 0.0–0.2)

## 2021-06-14 LAB — COMPREHENSIVE METABOLIC PANEL
ALT: 17 U/L (ref 0–44)
AST: 24 U/L (ref 15–41)
Albumin: 3.8 g/dL (ref 3.5–5.0)
Alkaline Phosphatase: 124 U/L (ref 38–126)
Anion gap: 9 (ref 5–15)
BUN: 11 mg/dL (ref 8–23)
CO2: 25 mmol/L (ref 22–32)
Calcium: 9.6 mg/dL (ref 8.9–10.3)
Chloride: 99 mmol/L (ref 98–111)
Creatinine, Ser: 0.68 mg/dL (ref 0.44–1.00)
GFR, Estimated: >60 mL/min (ref >60)
Glucose, Bld: 119 mg/dL — ABNORMAL HIGH (ref 70–99)
Potassium: 3.8 mmol/L (ref 3.5–5.1)
Sodium: 133 mmol/L — ABNORMAL LOW (ref 135–145)
Total Bilirubin: 0.3 mg/dL (ref 0.3–1.2)
Total Protein: 8.9 g/dL — ABNORMAL HIGH (ref 6.5–8.1)

## 2021-06-14 NOTE — Progress Notes (Signed)
Pt states she is a little anxious regarding insurance changes next year as this office will be considered out of network.  Pt states her seroma is back in right breast and does complain of some stiffness in joints.

## 2021-07-05 ENCOUNTER — Other Ambulatory Visit: Payer: 59

## 2021-08-25 ENCOUNTER — Encounter: Payer: Self-pay | Admitting: Oncology

## 2021-08-26 ENCOUNTER — Ambulatory Visit
Admission: RE | Admit: 2021-08-26 | Discharge: 2021-08-26 | Disposition: A | Discharge status: Home / Self Care | Payer: 59 | Attending: Internal Medicine | Admitting: Internal Medicine

## 2021-08-26 ENCOUNTER — Ambulatory Visit
Admission: RE | Admit: 2021-08-26 | Discharge: 2021-08-26 | Disposition: A | Discharge status: Home / Self Care | Payer: 59 | Source: Ambulatory Visit | Attending: Internal Medicine | Admitting: Internal Medicine

## 2021-08-26 ENCOUNTER — Other Ambulatory Visit: Payer: Self-pay | Admitting: Internal Medicine

## 2021-08-26 DIAGNOSIS — R059 Cough, unspecified: Secondary | ICD-10-CM

## 2021-09-02 DIAGNOSIS — E782 Mixed hyperlipidemia: Secondary | ICD-10-CM | POA: Diagnosis not present

## 2021-09-02 DIAGNOSIS — D75839 Thrombocytosis, unspecified: Secondary | ICD-10-CM | POA: Diagnosis not present

## 2021-09-02 DIAGNOSIS — I10 Essential (primary) hypertension: Secondary | ICD-10-CM | POA: Diagnosis not present

## 2021-09-02 DIAGNOSIS — I2510 Atherosclerotic heart disease of native coronary artery without angina pectoris: Secondary | ICD-10-CM | POA: Diagnosis not present

## 2021-09-09 DIAGNOSIS — D75839 Thrombocytosis, unspecified: Secondary | ICD-10-CM | POA: Diagnosis not present

## 2021-09-12 ENCOUNTER — Encounter: Payer: Self-pay | Admitting: Nurse Practitioner

## 2021-10-18 ENCOUNTER — Encounter: Payer: Self-pay | Admitting: Oncology

## 2021-10-26 DIAGNOSIS — Z1371 Encounter for nonprocreative screening for genetic disease carrier status: Secondary | ICD-10-CM

## 2021-10-26 HISTORY — DX: Encounter for nonprocreative screening for genetic disease carrier status: Z13.71

## 2021-11-14 DIAGNOSIS — R87612 Low grade squamous intraepithelial lesion on cytologic smear of cervix (LGSIL): Secondary | ICD-10-CM | POA: Insufficient documentation

## 2021-11-14 NOTE — Progress Notes (Signed)
? ?PCP: Perrin Maltese, MD ? ? ?Chief Complaint  ?Patient presents with  ? Gynecologic Exam  ?  No concerns  ? ? ?HPI: ?     [name] is a 61 y.o. [id number] whose LMP was No LMP recorded. Patient is postmenopausal., presents today for her annual examination.  Her menses are absent due to menopause. She does have vasomotor sx with arimidex. ? ?Sex activity: single partner, contraception - post menopausal status. She does have vaginal dryness.  ? ?Last Pap: 11/12/19 Results were: low-grade squamous intraepithelial neoplasia (LGSIL - encompassing HPV,mild dysplasia,CIN I) /neg HPV DNA; repeat pap due today  ?Hx of STDs: none ? ?Last mammogram: 11/12/20 Results were: normal--routine follow-up in 12 months; has appt today, followed by oncology. ?There is no FH of breast cancer. There is no FH of ovarian cancer. The patient does do self-breast exams. 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 The patient was treatd with chemo/rad and is currently on anastrazole.  Pt also with history of melanoma 2020. Pt never had cancer genetic testing done.   ? ?Colonoscopy: ~5 yrs ago at [facility] per pt; Repeat due after 5 years and got letter in the mail to schedule appt. Plans to do so.  ? ?Tobacco use: The patient denies current or previous tobacco use. ?Alcohol use: social drinker ?No drug use ?Exercise: not active ? ?She does get adequate calcium but not Vitamin D in her diet. ? ?Labs with PCP. ?DEXA 8/21 with oncology. ? ?Patient Active Problem List  ? Diagnosis Date Noted  ? LGSIL on Pap smear of cervix 11/14/2021  ? Malignant neoplasm of upper-inner quadrant of right breast in female, estrogen receptor positive ([place]) 09/05/2018  ? Menopause 10/23/2017  ? Vaginal odor 10/23/2017  ? MVP (mitral valve prolapse) 02/13/2014  ? Chest pain 02/03/2014  ? Coronary artery disease 02/03/2014  ? Essential hypertension 02/03/2014  ? Hyperlipidemia 02/03/2014  ? Myalgia  02/03/2014  ? Cardiac function test normal 02/03/2014  ? Normal cardiac stress test 02/03/2014  ? ? ?Past Surgical History:  ?Procedure Laterality Date  ? BREAST BIOPSY Right 08/27/2018  ? [place] bx, [facility]  ? BREAST LUMPECTOMY Right 09/11/2018  ? BREAST LUMPECTOMY WITH NEEDLE LOCALIZATION AND AXILLARY SENTINEL LYMPH NODE BX Right 09/11/2018  ? Procedure: BREAST LUMPECTOMY WITH NEEDLE LOCALIZATION AND SENTINEL LYMPH NODE BX;  Surgeon: [name], MD;  Location: [facility];  Service: General;  Laterality: Right;  ? CARDIAC CATHETERIZATION    ? CATARACT EXTRACTION W/PHACO Left 01/01/2017  ? Procedure: CATARACT EXTRACTION PHACO AND INTRAOCULAR LENS PLACEMENT (IOC)left Restor lens;  Surgeon: Leandrew Koyanagi, MD;  Location: [place];  Service: Ophthalmology;  Laterality: Left;  Restor lens  ? CATARACT EXTRACTION W/PHACO Right 01/31/2017  ? Procedure: CATARACT EXTRACTION PHACO AND INTRAOCULAR LENS PLACEMENT (IOC);  Surgeon: Leandrew Koyanagi, MD;  Location: [place];  Service: Ophthalmology;  Laterality: Right;  ? CESAREAN SECTION    ? CHOLECYSTECTOMY    ? HYSTEROSCOPY  06/2010  ? D&C  ? PORTACATH PLACEMENT Right 09/11/2018  ? Procedure: INSERTION PORT-A-CATH;  Surgeon: [name], MD;  Location: [facility];  Service: General;  Laterality: Right;  ? TEE WITHOUT CARDIOVERSION Right 11/02/2020  ? Procedure: TRANSESOPHAGEAL ECHOCARDIOGRAM (TEE);  Surgeon: Dionisio David, MD;  Location: [facility];  Service: Cardiovascular;  Laterality: Right;  ? TUBAL LIGATION    ? ? ?Family History  ?Problem Relation Age of Onset  ?  Hypertension Mother   ? Cancer Mother 25  ?     LUNG  ? Hypertension Father   ? Colon cancer Paternal Uncle   ? Breast cancer Neg Hx   ? ? ?Social History  ? ?Socioeconomic History  ? Marital status: Widowed  ?  Spouse name: Not on file  ? Number of children: 3  ? Years of education: 1  ? Highest education level: Not on file  ?Occupational History  ? Occupation: SELF EMPLOYED  ?Tobacco  Use  ? Smoking status: Never  ? Smokeless tobacco: Never  ?Vaping Use  ? Vaping Use: Never used  ?Substance and Sexual Activity  ? Alcohol use: Yes  ?  Alcohol/week: 3.0 standard drinks  ?  Types: 3 Shots of liquor per week  ?  Comment: social drinker  ? Drug use: No  ? Sexual activity: Yes  ?  Birth control/protection: Post-menopausal  ?Other Topics Concern  ? Not on file  ?Social History Narrative  ? Not on file  ? ?Social Determinants of Health  ? ?Financial Resource Strain: Not on file  ?Food Insecurity: Not on file  ?Transportation Needs: Not on file  ?Physical Activity: Not on file  ?Stress: Not on file  ?Social Connections: Not on file  ?Intimate Partner Violence: Not on file  ? ? ? ?Current Outpatient Medications:  ?  amLODipine-benazepril (LOTREL) 10-20 MG per capsule, Take 1 capsule by mouth daily. , Disp: , Rfl:  ?  anastrozole (ARIMIDEX) 1 MG tablet, TAKE 1 TABLET BY MOUTH DAILY, Disp: 90 tablet, Rfl: 3 ?  fluticasone (FLONASE) 50 MCG/ACT nasal spray, Place 2 sprays into both nostrils daily as needed., Disp: , Rfl:  ?  loratadine (CLARITIN) 10 MG tablet, Take 10 mg by mouth daily., Disp: , Rfl:  ?  omeprazole (PRILOSEC) 40 MG capsule, Take 40 mg by mouth daily., Disp: , Rfl:  ?  Phendimetrazine Tartrate 35 MG TABS, Take 1 tablet by mouth 2 (two) times daily., Disp: , Rfl:  ?  venlafaxine XR (EFFEXOR-XR) 75 MG 24 hr capsule, Take 75 mg by mouth daily with breakfast. , Disp: , Rfl:  ? ?Current Facility-Administered Medications:  ?  betamethasone acetate-betamethasone sodium phosphate (CELESTONE) injection 3 mg, 3 mg, Intramuscular, Once, Felecia Shelling, DPM ? ? ? ? ?ROS: ? ?Review of Systems  ?Constitutional:  Negative for fatigue, fever and unexpected weight change.  ?Respiratory:  Negative for cough, shortness of breath and wheezing.   ?Cardiovascular:  Negative for chest pain, palpitations and leg swelling.  ?Gastrointestinal:  Negative for blood in stool, constipation, diarrhea, nausea and vomiting.   ?Endocrine: Negative for cold intolerance, heat intolerance and polyuria.  ?Genitourinary:  Negative for dyspareunia, dysuria, flank pain, frequency, genital sores, hematuria, menstrual problem, pelvic pain, urgency, vaginal bleeding, vaginal discharge and vaginal pain.  ?Musculoskeletal:  Negative for back pain, joint swelling and myalgias.  ?Skin:  Negative for rash.  ?Neurological:  Negative for dizziness, syncope, light-headedness, numbness and headaches.  ?Hematological:  Negative for adenopathy.  ?Psychiatric/Behavioral:  Negative for agitation, confusion, sleep disturbance and suicidal ideas. The patient is not nervous/anxious.   ?BREAST: No symptoms ? ? ? ?Objective: ?BP 140/90   Ht 5\' 6"  (1.676 m)   Wt 230 lb (104.3 kg)   BMI 37.12 kg/m?  ? ? ?Physical Exam ?Constitutional:   ?   Appearance: She is well-developed.  ?Genitourinary:  ?   Vulva normal.  ?   Right Labia: No rash, tenderness or lesions. ?  Left Labia: No tenderness, lesions or rash. ?   No vaginal discharge, erythema or tenderness.  ? ?   Right Adnexa: not tender and no mass present. ?   Left Adnexa: not tender and no mass present. ?   No cervical friability or polyp.  ?   Uterus is not enlarged or tender.  ?Breasts: ?   Right: No mass, nipple discharge, skin change or tenderness.  ?   Left: No mass, nipple discharge, skin change or tenderness.  ?Neck:  ?   Thyroid: No thyromegaly.  ?Cardiovascular:  ?   Rate and Rhythm: Normal rate and regular rhythm.  ?   Heart sounds: Normal heart sounds. No murmur heard. ?Pulmonary:  ?   Effort: Pulmonary effort is normal.  ?   Breath sounds: Normal breath sounds.  ?Abdominal:  ?   Palpations: Abdomen is soft.  ?   Tenderness: There is no abdominal tenderness. There is no guarding or rebound.  ?Musculoskeletal:     ?   General: Normal range of motion.  ?   Cervical back: Normal range of motion.  ?Lymphadenopathy:  ?   Cervical: No cervical adenopathy.  ?Neurological:  ?   General: No focal deficit  present.  ?   Mental Status: She is alert and oriented to person, place, and time.  ?   Cranial Nerves: No cranial nerve deficit.  ?Skin: ?   General: Skin is warm and dry.  ?Psychiatric:     ?   Mood and Affect: Mood

## 2021-11-15 ENCOUNTER — Other Ambulatory Visit (HOSPITAL_COMMUNITY)
Admission: RE | Admit: 2021-11-15 | Discharge: 2021-11-15 | Disposition: A | Discharge status: Home / Self Care | Payer: 59 | Source: Ambulatory Visit | Attending: Obstetrics and Gynecology | Admitting: Obstetrics and Gynecology

## 2021-11-15 ENCOUNTER — Encounter: Payer: Self-pay | Admitting: Oncology

## 2021-11-15 ENCOUNTER — Ambulatory Visit
Admission: RE | Admit: 2021-11-15 | Discharge: 2021-11-15 | Disposition: A | Discharge status: Home / Self Care | Payer: 59 | Source: Ambulatory Visit | Attending: Oncology | Admitting: Oncology

## 2021-11-15 ENCOUNTER — Encounter: Payer: Self-pay | Admitting: Obstetrics and Gynecology

## 2021-11-15 ENCOUNTER — Ambulatory Visit (INDEPENDENT_AMBULATORY_CARE_PROVIDER_SITE_OTHER): Payer: 59 | Admitting: Obstetrics and Gynecology

## 2021-11-15 ENCOUNTER — Other Ambulatory Visit: Payer: Self-pay

## 2021-11-15 VITALS — BP 140/90 | Ht 66.0 in | Wt 230.0 lb

## 2021-11-15 DIAGNOSIS — Z85820 Personal history of malignant melanoma of skin: Secondary | ICD-10-CM | POA: Diagnosis not present

## 2021-11-15 DIAGNOSIS — Z170 Estrogen receptor positive status [ER+]: Secondary | ICD-10-CM | POA: Insufficient documentation

## 2021-11-15 DIAGNOSIS — Z1151 Encounter for screening for human papillomavirus (HPV): Secondary | ICD-10-CM

## 2021-11-15 DIAGNOSIS — Z800 Family history of malignant neoplasm of digestive organs: Secondary | ICD-10-CM | POA: Diagnosis not present

## 2021-11-15 DIAGNOSIS — Z124 Encounter for screening for malignant neoplasm of cervix: Secondary | ICD-10-CM | POA: Diagnosis not present

## 2021-11-15 DIAGNOSIS — Z1211 Encounter for screening for malignant neoplasm of colon: Secondary | ICD-10-CM | POA: Diagnosis not present

## 2021-11-15 DIAGNOSIS — Z853 Personal history of malignant neoplasm of breast: Secondary | ICD-10-CM | POA: Diagnosis not present

## 2021-11-15 DIAGNOSIS — Z01419 Encounter for gynecological examination (general) (routine) without abnormal findings: Secondary | ICD-10-CM

## 2021-11-15 DIAGNOSIS — R87612 Low grade squamous intraepithelial lesion on cytologic smear of cervix (LGSIL): Secondary | ICD-10-CM

## 2021-11-15 DIAGNOSIS — C50211 Malignant neoplasm of upper-inner quadrant of right female breast: Secondary | ICD-10-CM | POA: Diagnosis not present

## 2021-11-15 DIAGNOSIS — Z1231 Encounter for screening mammogram for malignant neoplasm of breast: Secondary | ICD-10-CM | POA: Insufficient documentation

## 2021-11-15 DIAGNOSIS — R69 Illness, unspecified: Secondary | ICD-10-CM | POA: Diagnosis not present

## 2021-11-15 NOTE — Patient Instructions (Signed)
I value your feedback and you entrusting us with your care. If you get a [facility] patient survey, I would appreciate you taking the time to let us know about your experience today. Thank you! ? ? ?

## 2021-11-17 LAB — CYTOLOGY - PAP
Comment: NEGATIVE
Diagnosis: NEGATIVE
High risk HPV: POSITIVE — AB

## 2021-12-01 DIAGNOSIS — I10 Essential (primary) hypertension: Secondary | ICD-10-CM | POA: Diagnosis not present

## 2021-12-01 DIAGNOSIS — M8589 Other specified disorders of bone density and structure, multiple sites: Secondary | ICD-10-CM | POA: Diagnosis not present

## 2021-12-01 DIAGNOSIS — E668 Other obesity: Secondary | ICD-10-CM | POA: Diagnosis not present

## 2021-12-01 DIAGNOSIS — E782 Mixed hyperlipidemia: Secondary | ICD-10-CM | POA: Diagnosis not present

## 2021-12-01 DIAGNOSIS — R002 Palpitations: Secondary | ICD-10-CM | POA: Diagnosis not present

## 2021-12-12 ENCOUNTER — Other Ambulatory Visit: Payer: 59

## 2021-12-12 ENCOUNTER — Ambulatory Visit: Payer: 59 | Admitting: Nurse Practitioner

## 2021-12-13 ENCOUNTER — Ambulatory Visit
Admission: RE | Admit: 2021-12-13 | Discharge: 2021-12-13 | Disposition: A | Discharge status: Home / Self Care | Payer: 59 | Source: Ambulatory Visit | Attending: Internal Medicine | Admitting: Internal Medicine

## 2021-12-13 ENCOUNTER — Other Ambulatory Visit: Payer: Self-pay | Admitting: Internal Medicine

## 2021-12-13 ENCOUNTER — Ambulatory Visit
Admission: RE | Admit: 2021-12-13 | Discharge: 2021-12-13 | Disposition: A | Discharge status: Home / Self Care | Payer: 59 | Attending: Internal Medicine | Admitting: Internal Medicine

## 2021-12-13 ENCOUNTER — Inpatient Hospital Stay: Payer: 59

## 2021-12-13 ENCOUNTER — Inpatient Hospital Stay: Payer: 59 | Attending: Nurse Practitioner | Admitting: Medical Oncology

## 2021-12-13 VITALS — BP 146/83 | HR 88 | Temp 97.8°F | Resp 18 | Wt 229.0 lb

## 2021-12-13 DIAGNOSIS — M8580 Other specified disorders of bone density and structure, unspecified site: Secondary | ICD-10-CM | POA: Insufficient documentation

## 2021-12-13 DIAGNOSIS — R059 Cough, unspecified: Secondary | ICD-10-CM

## 2021-12-13 DIAGNOSIS — Z923 Personal history of irradiation: Secondary | ICD-10-CM | POA: Insufficient documentation

## 2021-12-13 DIAGNOSIS — Z801 Family history of malignant neoplasm of trachea, bronchus and lung: Secondary | ICD-10-CM | POA: Diagnosis not present

## 2021-12-13 DIAGNOSIS — E876 Hypokalemia: Secondary | ICD-10-CM

## 2021-12-13 DIAGNOSIS — Z79811 Long term (current) use of aromatase inhibitors: Secondary | ICD-10-CM | POA: Diagnosis not present

## 2021-12-13 DIAGNOSIS — C50211 Malignant neoplasm of upper-inner quadrant of right female breast: Secondary | ICD-10-CM | POA: Insufficient documentation

## 2021-12-13 DIAGNOSIS — Z170 Estrogen receptor positive status [ER+]: Secondary | ICD-10-CM | POA: Insufficient documentation

## 2021-12-13 DIAGNOSIS — Z79899 Other long term (current) drug therapy: Secondary | ICD-10-CM | POA: Insufficient documentation

## 2021-12-13 DIAGNOSIS — Z9221 Personal history of antineoplastic chemotherapy: Secondary | ICD-10-CM | POA: Insufficient documentation

## 2021-12-13 DIAGNOSIS — Z800 Family history of malignant neoplasm of digestive organs: Secondary | ICD-10-CM | POA: Diagnosis not present

## 2021-12-13 LAB — CBC WITH DIFFERENTIAL/PLATELET
Abs Immature Granulocytes: 0.03 10*3/uL (ref 0.00–0.07)
Basophils Absolute: 0.1 10*3/uL (ref 0.0–0.1)
Basophils Relative: 1 %
Eosinophils Absolute: 0.1 10*3/uL (ref 0.0–0.5)
Eosinophils Relative: 1 %
HCT: 40.8 % (ref 36.0–46.0)
Hemoglobin: 13.2 g/dL (ref 12.0–15.0)
Immature Granulocytes: 0 %
Lymphocytes Relative: 31 %
Lymphs Abs: 2.5 10*3/uL (ref 0.7–4.0)
MCH: 28.4 pg (ref 26.0–34.0)
MCHC: 32.4 g/dL (ref 30.0–36.0)
MCV: 87.7 fL (ref 80.0–100.0)
Monocytes Absolute: 0.8 10*3/uL (ref 0.1–1.0)
Monocytes Relative: 10 %
Neutro Abs: 4.5 10*3/uL (ref 1.7–7.7)
Neutrophils Relative %: 57 %
Platelets: 366 10*3/uL (ref 150–400)
RBC: 4.65 MIL/uL (ref 3.87–5.11)
RDW: 13.8 % (ref 11.5–15.5)
WBC: 7.9 10*3/uL (ref 4.0–10.5)
nRBC: 0 % (ref 0.0–0.2)

## 2021-12-13 LAB — COMPREHENSIVE METABOLIC PANEL
ALT: 22 U/L (ref 0–44)
AST: 28 U/L (ref 15–41)
Albumin: 3.7 g/dL (ref 3.5–5.0)
Alkaline Phosphatase: 92 U/L (ref 38–126)
Anion gap: 9 (ref 5–15)
BUN: 13 mg/dL (ref 8–23)
CO2: 24 mmol/L (ref 22–32)
Calcium: 9.1 mg/dL (ref 8.9–10.3)
Chloride: 100 mmol/L (ref 98–111)
Creatinine, Ser: 0.65 mg/dL (ref 0.44–1.00)
GFR, Estimated: >60 mL/min (ref >60)
Glucose, Bld: 119 mg/dL — ABNORMAL HIGH (ref 70–99)
Potassium: 3.4 mmol/L — ABNORMAL LOW (ref 3.5–5.1)
Sodium: 133 mmol/L — ABNORMAL LOW (ref 135–145)
Total Bilirubin: 0.5 mg/dL (ref 0.3–1.2)
Total Protein: 8.2 g/dL — ABNORMAL HIGH (ref 6.5–8.1)

## 2021-12-13 MED ORDER — ANASTROZOLE 1 MG PO TABS: 1.0000 mg | ORAL_TABLET | Freq: Every day | ORAL | 3 refills | Status: DC

## 2021-12-13 MED ORDER — POTASSIUM CHLORIDE CRYS ER 10 MEQ PO TBCR
10.0000 meq | EXTENDED_RELEASE_TABLET | Freq: Every day | ORAL | 0 refills | Status: DC
Start: 2021-12-13 — End: 2022-11-21

## 2021-12-13 NOTE — Progress Notes (Signed)
?[place]  ?[phone]) [phone] [phone] ? ?ID: Erin Church OB: 02-Apr-1961  MR#: [id number]  XBW#:[id number] ? ?Patient Care Team: ?Perrin Maltese, MD as PCP - General (Internal Medicine) ?Rico Junker, RN as Registered Nurse ?Lloyd Huger, MD as Consulting Physician (Oncology) ?[name], MD (Inactive) as Consulting Physician (General Surgery) ?Noreene Filbert, MD as Referring Physician (Radiation Oncology) ? 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 ? ?INTERVAL HISTORY: Patient returns to clinic today for her routine 45-month evaluation.  She continues to feel well and remains asymptomatic. Recently had her yearly well woman exam including breast exam at OB-GYN. Has DEXA scheduled for within the next few weeks. She continues to tolerate anastrozole without significant side effects. She has no neurologic complaints.  She denies any recent fevers or illnesses.  She has a good appetite and denies weight loss.  She denies any chest pain, shortness of breath, cough, or hemoptysis.  She denies any nausea, vomiting, constipation, or diarrhea.  She has no urinary complaints.  Patient offers no further specific complaints today.  Patient feels at her baseline offers no specific complaints today. ? ?REVIEW OF SYSTEMS:   ?Review of Systems  ?Constitutional: Negative.  Negative for fever, malaise/fatigue and weight loss.  ?Respiratory: Negative.  Negative for cough, hemoptysis and shortness of breath.   ?Cardiovascular: Negative.  Negative for chest pain and leg swelling.  ?Gastrointestinal: Negative.  Negative for abdominal pain, blood in stool and melena.  ?Genitourinary: Negative.  Negative for dysuria.  ?Musculoskeletal: Negative.  Negative for back pain.  ?Skin: Negative.  Negative for rash.  ?Neurological: Negative.  Negative for seizures, weakness and headaches.   ?Psychiatric/Behavioral: Negative.  The patient is not nervous/anxious.   ? ?As per HPI. Otherwise, a complete review of systems is negative. ? ?PAST MEDICAL HISTORY: ?Past Medical History:  ?Diagnosis Date  ? Anginal pain ([place])   ? Arthritis   ? Breast cancer ([place])   ? Carpal tunnel syndrome of right wrist   ? Coronary atherosclerosis of unspecified type of vessel, native or graft   ? Hand and foot pain   ? HER2-positive carcinoma of breast ([place])   ? Hyperlipidemia   ? Hypertension   ? Melanoma ([place]) 2020  ? MVP (mitral valve prolapse)   ? Personal history of chemotherapy   ? Personal history of radiation therapy   ? Sleep apnea   ? Vertigo   ? ? ?PAST SURGICAL HISTORY: ?Past Surgical History:  ?Procedure Laterality Date  ? BREAST BIOPSY Right 08/27/2018  ? [place] bx, [facility]  ? BREAST LUMPECTOMY Right 09/11/2018  ? BREAST LUMPECTOMY WITH NEEDLE LOCALIZATION AND AXILLARY SENTINEL LYMPH NODE BX Right 09/11/2018  ? Procedure: BREAST LUMPECTOMY WITH NEEDLE LOCALIZATION AND SENTINEL LYMPH NODE BX;  Surgeon: [name], MD;  Location: [facility];  Service: General;  Laterality: Right;  ? CARDIAC CATHETERIZATION    ? CATARACT EXTRACTION W/PHACO Left 01/01/2017  ? Procedure: CATARACT EXTRACTION PHACO AND INTRAOCULAR LENS PLACEMENT (IOC)left Restor lens;  Surgeon: Leandrew Koyanagi, MD;  Location: [place];  Service: Ophthalmology;  Laterality: Left;  Restor lens  ? CATARACT EXTRACTION W/PHACO Right 01/31/2017  ? Procedure: CATARACT EXTRACTION PHACO AND INTRAOCULAR LENS PLACEMENT (IOC);  Surgeon: Leandrew Koyanagi, MD;  Location: [place];  Service: Ophthalmology;  Laterality: Right;  ? CESAREAN SECTION    ? CHOLECYSTECTOMY    ? HYSTEROSCOPY  06/2010  ?  D&C  ? PORTACATH PLACEMENT Right 09/11/2018  ? Procedure: INSERTION PORT-A-CATH;  Surgeon: [name], MD;  Location: [facility];  Service: General;  Laterality: Right;  ? TEE WITHOUT CARDIOVERSION Right 11/02/2020  ? Procedure: TRANSESOPHAGEAL  ECHOCARDIOGRAM (TEE);  Surgeon: Dionisio David, MD;  Location: [facility];  Service: Cardiovascular;  Laterality: Right;  ? TUBAL LIGATION    ? ? ?FAMILY HISTORY: ?Family History  ?Problem Relation Age of Onset  ? Hypertension Mother   ? Cancer Mother 81  ?     LUNG  ? Hypertension Father   ? Colon cancer Paternal Uncle   ? Breast cancer Neg Hx   ? ? ?ADVANCED DIRECTIVES (Y/N):  N ? ?HEALTH MAINTENANCE: ?Social History  ? ?Tobacco Use  ? Smoking status: Never  ? Smokeless tobacco: Never  ?Vaping Use  ? Vaping Use: Never used  ?Substance Use Topics  ? Alcohol use: Yes  ?  Alcohol/week: 3.0 standard drinks  ?  Types: 3 Shots of liquor per week  ?  Comment: social drinker  ? Drug use: No  ? ? ? Colonoscopy: ? PAP: ? Bone density: ? Lipid panel: ? ?No Known Allergies ? ?Current Outpatient Medications  ?Medication Sig Dispense Refill  ? amLODipine-benazepril (LOTREL) 10-20 MG per capsule Take 1 capsule by mouth daily.     ? fluticasone (FLONASE) 50 MCG/ACT nasal spray Place 2 sprays into both nostrils daily as needed.    ? loratadine (CLARITIN) 10 MG tablet Take 10 mg by mouth daily.    ? omeprazole (PRILOSEC) 40 MG capsule Take 40 mg by mouth daily.    ? Phendimetrazine Tartrate 35 MG TABS Take 1 tablet by mouth 2 (two) times daily.    ? potassium chloride (KLOR-CON M) 10 MEQ tablet Take 1 tablet (10 mEq total) by mouth daily. 90 tablet 0  ? venlafaxine XR (EFFEXOR-XR) 75 MG 24 hr capsule Take 75 mg by mouth daily with breakfast.     ? anastrozole (ARIMIDEX) 1 MG tablet Take 1 tablet (1 mg total) by mouth daily. 90 tablet 3  ? ?Current Facility-Administered Medications  ?Medication Dose Route Frequency Provider Last Rate Last Admin  ? betamethasone acetate-betamethasone sodium phosphate (CELESTONE) injection 3 mg  3 mg Intramuscular Once Edrick Kins, DPM      ? ? ?OBJECTIVE: ?Vitals:  ? 12/13/21 1032  ?BP: (!) 146/83  ?Pulse: 88  ?Resp: 18  ?Temp: 97.8 ?F (36.6 ?C)  ?   Body mass index is 36.96 kg/m?Marland Kitchen    ECOG FS:0 -  Asymptomatic ? ?General: Well-developed, well-nourished, no acute distress. ?Eyes: Pink conjunctiva, anicteric sclera. ?HEENT: Normocephalic, moist mucous membranes. ?Breast: Exam deferred today. ?Lungs: No audible wheezing or coughing. ?Heart: Regular rate and rhythm. ?Abdomen: Soft, nontender, no obvious distention. ?Musculoskeletal: No edema, cyanosis, or clubbing. ?Neuro: Alert, answering all questions appropriately. Cranial nerves grossly intact. ?Skin: No rashes or petechiae noted. ?Psych: Normal affect. ? ? ?LAB RESULTS: ? ?Lab Results  ?Component Value Date  ? NA 133 (L) 12/13/2021  ? K 3.4 (L) 12/13/2021  ? CL 100 12/13/2021  ? CO2 24 12/13/2021  ? GLUCOSE 119 (H) 12/13/2021  ? BUN 13 12/13/2021  ? CREATININE 0.65 12/13/2021  ? CALCIUM 9.1 12/13/2021  ? PROT 8.2 (H) 12/13/2021  ? ALBUMIN 3.7 12/13/2021  ? AST 28 12/13/2021  ? ALT 22 12/13/2021  ? ALKPHOS 92 12/13/2021  ? BILITOT 0.5 12/13/2021  ? GFRNONAA >60 12/13/2021  ? GFRAA >60 04/07/2020  ? ? ?Lab  Results  ?Component Value Date  ? WBC 7.9 12/13/2021  ? NEUTROABS 4.5 12/13/2021  ? HGB 13.2 12/13/2021  ? HCT 40.8 12/13/2021  ? MCV 87.7 12/13/2021  ? PLT 366 12/13/2021  ? ? ? ?STUDIES: ?MM 3D SCREEN BREAST BILATERAL ? ?Result Date: 11/16/2021 ?CLINICAL DATA:  Screening. EXAM: DIGITAL SCREENING BILATERAL MAMMOGRAM WITH TOMOSYNTHESIS AND CAD TECHNIQUE: Bilateral screening digital craniocaudal and mediolateral oblique mammograms were obtained. Bilateral screening digital breast tomosynthesis was performed. The images were evaluated with computer-aided detection. COMPARISON:  Previous exam(s). ACR Breast Density Category b: There are scattered areas of fibroglandular density. FINDINGS: There are no findings suspicious for malignancy. IMPRESSION: No mammographic evidence of malignancy. A result letter of this screening mammogram will be mailed directly to the patient. RECOMMENDATION: Screening mammogram in one year. (Code:SM-B-01Y) BI-RADS CATEGORY  1:  Negative. Electronically Signed   By: Nolon Nations M.D.   On: 11/16/2021 08:18   ? 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 ?P

## 2021-12-21 ENCOUNTER — Telehealth: Payer: Self-pay | Admitting: Obstetrics and Gynecology

## 2021-12-21 ENCOUNTER — Encounter: Payer: Self-pay | Admitting: Obstetrics and Gynecology

## 2021-12-21 NOTE — Telephone Encounter (Signed)
Pt aware of neg MyRisk results. Pt with personal hx of breast cancer and melanoma. ? ?Patient understands these results only apply to her and her children. ? ?Pt also understands negative genetic testing doesn't mean she will never get any of these cancers.  ? ?Hard copy mailed to pt. F/u prn.  ? ?

## 2021-12-26 ENCOUNTER — Encounter: Payer: Self-pay | Admitting: Oncology

## 2021-12-26 DIAGNOSIS — M546 Pain in thoracic spine: Secondary | ICD-10-CM | POA: Diagnosis not present

## 2021-12-26 DIAGNOSIS — E782 Mixed hyperlipidemia: Secondary | ICD-10-CM | POA: Diagnosis not present

## 2021-12-26 DIAGNOSIS — I10 Essential (primary) hypertension: Secondary | ICD-10-CM | POA: Diagnosis not present

## 2021-12-26 NOTE — Telephone Encounter (Signed)
Spoke to pt, and she stated that she had been in touch with PCP, who ordered x-rays and u/a. She will contact us for any further questions or concerns.  ?

## 2022-01-03 DIAGNOSIS — E782 Mixed hyperlipidemia: Secondary | ICD-10-CM | POA: Diagnosis not present

## 2022-01-03 DIAGNOSIS — M5414 Radiculopathy, thoracic region: Secondary | ICD-10-CM | POA: Diagnosis not present

## 2022-01-03 DIAGNOSIS — I10 Essential (primary) hypertension: Secondary | ICD-10-CM | POA: Diagnosis not present

## 2022-01-17 DIAGNOSIS — M546 Pain in thoracic spine: Secondary | ICD-10-CM | POA: Diagnosis not present

## 2022-01-17 DIAGNOSIS — E782 Mixed hyperlipidemia: Secondary | ICD-10-CM | POA: Diagnosis not present

## 2022-01-17 DIAGNOSIS — I10 Essential (primary) hypertension: Secondary | ICD-10-CM | POA: Diagnosis not present

## 2022-01-31 DIAGNOSIS — N958 Other specified menopausal and perimenopausal disorders: Secondary | ICD-10-CM | POA: Diagnosis not present

## 2022-01-31 DIAGNOSIS — R29890 Loss of height: Secondary | ICD-10-CM | POA: Diagnosis not present

## 2022-03-01 DIAGNOSIS — R300 Dysuria: Secondary | ICD-10-CM | POA: Diagnosis not present

## 2022-03-01 DIAGNOSIS — N390 Urinary tract infection, site not specified: Secondary | ICD-10-CM | POA: Diagnosis not present

## 2022-03-07 DIAGNOSIS — I10 Essential (primary) hypertension: Secondary | ICD-10-CM | POA: Diagnosis not present

## 2022-03-07 DIAGNOSIS — R69 Illness, unspecified: Secondary | ICD-10-CM | POA: Diagnosis not present

## 2022-03-07 DIAGNOSIS — E781 Pure hyperglyceridemia: Secondary | ICD-10-CM | POA: Diagnosis not present

## 2022-03-07 DIAGNOSIS — E668 Other obesity: Secondary | ICD-10-CM | POA: Diagnosis not present

## 2022-03-13 DIAGNOSIS — R002 Palpitations: Secondary | ICD-10-CM | POA: Diagnosis not present

## 2022-03-13 DIAGNOSIS — E782 Mixed hyperlipidemia: Secondary | ICD-10-CM | POA: Diagnosis not present

## 2022-03-13 DIAGNOSIS — I10 Essential (primary) hypertension: Secondary | ICD-10-CM | POA: Diagnosis not present

## 2022-06-12 DIAGNOSIS — R69 Illness, unspecified: Secondary | ICD-10-CM | POA: Diagnosis not present

## 2022-06-12 DIAGNOSIS — E782 Mixed hyperlipidemia: Secondary | ICD-10-CM | POA: Diagnosis not present

## 2022-06-12 DIAGNOSIS — I10 Essential (primary) hypertension: Secondary | ICD-10-CM | POA: Diagnosis not present

## 2022-06-12 DIAGNOSIS — R5383 Other fatigue: Secondary | ICD-10-CM | POA: Diagnosis not present

## 2022-06-20 ENCOUNTER — Inpatient Hospital Stay: Payer: 59 | Attending: Oncology

## 2022-06-20 ENCOUNTER — Inpatient Hospital Stay: Payer: 59 | Admitting: Oncology

## 2022-06-20 ENCOUNTER — Encounter: Payer: Self-pay | Admitting: Oncology

## 2022-06-20 VITALS — BP 136/88 | HR 81 | Temp 98.0°F | Wt 234.0 lb

## 2022-06-20 DIAGNOSIS — M8580 Other specified disorders of bone density and structure, unspecified site: Secondary | ICD-10-CM | POA: Insufficient documentation

## 2022-06-20 DIAGNOSIS — Z79899 Other long term (current) drug therapy: Secondary | ICD-10-CM | POA: Insufficient documentation

## 2022-06-20 DIAGNOSIS — Z170 Estrogen receptor positive status [ER+]: Secondary | ICD-10-CM | POA: Diagnosis not present

## 2022-06-20 DIAGNOSIS — Z923 Personal history of irradiation: Secondary | ICD-10-CM | POA: Insufficient documentation

## 2022-06-20 DIAGNOSIS — Z79811 Long term (current) use of aromatase inhibitors: Secondary | ICD-10-CM | POA: Diagnosis not present

## 2022-06-20 DIAGNOSIS — C50211 Malignant neoplasm of upper-inner quadrant of right female breast: Secondary | ICD-10-CM | POA: Insufficient documentation

## 2022-06-20 DIAGNOSIS — Z1231 Encounter for screening mammogram for malignant neoplasm of breast: Secondary | ICD-10-CM

## 2022-06-20 LAB — CBC WITH DIFFERENTIAL/PLATELET
Abs Immature Granulocytes: 0.03 10*3/uL (ref 0.00–0.07)
Basophils Absolute: 0.1 10*3/uL (ref 0.0–0.1)
Basophils Relative: 2 %
Eosinophils Absolute: 0.1 10*3/uL (ref 0.0–0.5)
Eosinophils Relative: 2 %
HCT: 40.5 % (ref 36.0–46.0)
Hemoglobin: 13.4 g/dL (ref 12.0–15.0)
Immature Granulocytes: 0 %
Lymphocytes Relative: 35 %
Lymphs Abs: 2.6 10*3/uL (ref 0.7–4.0)
MCH: 29.2 pg (ref 26.0–34.0)
MCHC: 33.1 g/dL (ref 30.0–36.0)
MCV: 88.2 fL (ref 80.0–100.0)
Monocytes Absolute: 1 10*3/uL (ref 0.1–1.0)
Monocytes Relative: 13 %
Neutro Abs: 3.6 10*3/uL (ref 1.7–7.7)
Neutrophils Relative %: 48 %
Platelets: 388 10*3/uL (ref 150–400)
RBC: 4.59 MIL/uL (ref 3.87–5.11)
RDW: 13.2 % (ref 11.5–15.5)
WBC: 7.5 10*3/uL (ref 4.0–10.5)
nRBC: 0 % (ref 0.0–0.2)

## 2022-06-20 LAB — COMPREHENSIVE METABOLIC PANEL
ALT: 25 U/L (ref 0–44)
AST: 33 U/L (ref 15–41)
Albumin: 3.6 g/dL (ref 3.5–5.0)
Alkaline Phosphatase: 91 U/L (ref 38–126)
Anion gap: 7 (ref 5–15)
BUN: 14 mg/dL (ref 8–23)
CO2: 20 mmol/L — ABNORMAL LOW (ref 22–32)
Calcium: 8.8 mg/dL — ABNORMAL LOW (ref 8.9–10.3)
Chloride: 106 mmol/L (ref 98–111)
Creatinine, Ser: 0.59 mg/dL (ref 0.44–1.00)
GFR, Estimated: >60 mL/min (ref >60)
Glucose, Bld: 100 mg/dL — ABNORMAL HIGH (ref 70–99)
Potassium: 4.2 mmol/L (ref 3.5–5.1)
Sodium: 133 mmol/L — ABNORMAL LOW (ref 135–145)
Total Bilirubin: 0.5 mg/dL (ref 0.3–1.2)
Total Protein: 8.1 g/dL (ref 6.5–8.1)

## 2022-06-20 NOTE — Progress Notes (Signed)
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  INTERVAL HISTORY: Patient returns to clinic today for routine 67-month evaluation.  She continues to tolerate anastrozole well without significant side effects.  She currently feels well and is asymptomatic.  She remains active taking care of her 4-month-old grandson 2 days/week.  She has no neurologic complaints.  She denies any recent fevers or illnesses.  She has a good appetite and denies weight loss.  She denies any chest pain, shortness of breath, cough, or hemoptysis.  She denies any nausea, vomiting, constipation, or diarrhea.  She has no urinary complaints.  Patient offers no further specific complaints today.  Patient offers no specific complaints today.  REVIEW OF SYSTEMS:   Review of Systems  Constitutional: Negative.  Negative for fever, malaise/fatigue and weight loss.  Respiratory: Negative.  Negative for cough, hemoptysis and shortness of breath.   Cardiovascular: Negative.  Negative for chest pain and leg swelling.  Gastrointestinal: Negative.  Negative for abdominal pain, blood in stool and melena.  Genitourinary: Negative.  Negative for dysuria.  Musculoskeletal: Negative.  Negative for back pain.  Skin: Negative.  Negative for rash.  Neurological: Negative.  Negative for seizures, weakness and headaches.  Psychiatric/Behavioral: Negative.  The patient is not nervous/anxious.     As  per HPI. Otherwise, a complete review of systems is negative.  PAST MEDICAL HISTORY: Past Medical History:  Diagnosis Date   Anginal pain ([place])    Arthritis    BRCA negative 10/2021   MyRisk neg   Breast cancer ([place])    Carpal tunnel syndrome of right wrist    Coronary atherosclerosis of unspecified type of vessel, native or graft    Hand and foot pain    HER2-positive carcinoma of breast ([place])    Hyperlipidemia    Hypertension    Melanoma ([place]) 2020   MVP (mitral valve prolapse)    Personal history of chemotherapy    Personal history of radiation therapy    Sleep apnea    Vertigo     PAST SURGICAL HISTORY: Past Surgical History:  Procedure Laterality Date   BREAST BIOPSY Right 08/27/2018   [place] bx, [facility]   BREAST LUMPECTOMY Right 09/11/2018   BREAST LUMPECTOMY WITH NEEDLE LOCALIZATION AND AXILLARY SENTINEL LYMPH NODE BX Right 09/11/2018   Procedure: BREAST LUMPECTOMY WITH NEEDLE LOCALIZATION AND SENTINEL LYMPH NODE BX;  Surgeon: [name], MD;  Location: [facility];  Service: General;  Laterality: Right;   CARDIAC CATHETERIZATION     CATARACT EXTRACTION W/PHACO Left 01/01/2017   Procedure: CATARACT EXTRACTION PHACO AND INTRAOCULAR LENS PLACEMENT (IOC)left Restor lens;  Surgeon: Leandrew Koyanagi, MD;  Location: [place];  Service: Ophthalmology;  Laterality: Left;  Restor lens   CATARACT EXTRACTION W/PHACO Right 01/31/2017   Procedure: CATARACT EXTRACTION PHACO AND INTRAOCULAR LENS PLACEMENT (IOC);  Surgeon: Leandrew Koyanagi, MD;  Location: [place];  Service: Ophthalmology;  Laterality: Right;   CESAREAN SECTION     CHOLECYSTECTOMY     HYSTEROSCOPY  06/2010   D&C  PORTACATH PLACEMENT Right 09/11/2018   Procedure: INSERTION PORT-A-CATH;  Surgeon: [name], MD;  Location: [facility];  Service: General;  Laterality: Right;   TEE WITHOUT CARDIOVERSION Right 11/02/2020   Procedure: TRANSESOPHAGEAL ECHOCARDIOGRAM (TEE);  Surgeon: Dionisio David,  MD;  Location: [facility];  Service: Cardiovascular;  Laterality: Right;   TUBAL LIGATION      FAMILY HISTORY: Family History  Problem Relation Age of Onset   Hypertension Mother    Cancer Mother 108       LUNG   Hypertension Father    Colon cancer Paternal Uncle    Breast cancer Neg Hx     ADVANCED DIRECTIVES (Y/N):  N  HEALTH MAINTENANCE: Social History   Tobacco Use   Smoking status: Never   Smokeless tobacco: Never  Vaping Use   Vaping Use: Never used  Substance Use Topics   Alcohol use: Yes    Alcohol/week: 3.0 standard drinks of alcohol    Types: 3 Shots of liquor per week    Comment: social drinker   Drug use: No     Colonoscopy:  PAP:  Bone density:  Lipid panel:  No Known Allergies  Current Outpatient Medications  Medication Sig Dispense Refill   amLODipine-benazepril (LOTREL) 10-20 MG per capsule Take 1 capsule by mouth daily.      anastrozole (ARIMIDEX) 1 MG tablet Take 1 tablet (1 mg total) by mouth daily. 90 tablet 3   fluticasone (FLONASE) 50 MCG/ACT nasal spray Place 2 sprays into both nostrils daily as needed.     loratadine (CLARITIN) 10 MG tablet Take 10 mg by mouth daily.     omeprazole (PRILOSEC) 40 MG capsule Take 40 mg by mouth daily.     Phendimetrazine Tartrate 35 MG TABS Take 1 tablet by mouth 2 (two) times daily.     potassium chloride (KLOR-CON M) 10 MEQ tablet Take 1 tablet (10 mEq total) by mouth daily. 90 tablet 0   venlafaxine XR (EFFEXOR-XR) 75 MG 24 hr capsule Take 75 mg by mouth daily with breakfast.      Current Facility-Administered Medications  Medication Dose Route Frequency Provider Last Rate Last Admin   betamethasone acetate-betamethasone sodium phosphate (CELESTONE) injection 3 mg  3 mg Intramuscular Once Edrick Kins, DPM        OBJECTIVE: Vitals:   06/20/22 1025  BP: 136/88  Pulse: 81  Temp: 98 F (36.7 C)     Body mass index is 37.77 kg/m.    ECOG FS:0 - Asymptomatic  General: Well-developed, well-nourished,  no acute distress. Eyes: Pink conjunctiva, anicteric sclera. HEENT: Normocephalic, moist mucous membranes. Breast: Exam recently performed by another provider. Lungs: No audible wheezing or coughing. Heart: Regular rate and rhythm. Abdomen: Soft, nontender, no obvious distention. Musculoskeletal: No edema, cyanosis, or clubbing. Neuro: Alert, answering all questions appropriately. Cranial nerves grossly intact. Skin: No rashes or petechiae noted. Psych: Normal affect.  LAB RESULTS:  Lab Results  Component Value Date   NA 133 (L) 06/20/2022   K 4.2 06/20/2022   CL 106 06/20/2022   CO2 20 (L) 06/20/2022   GLUCOSE 100 (H) 06/20/2022   BUN 14 06/20/2022   CREATININE 0.59 06/20/2022   CALCIUM 8.8 (L) 06/20/2022   PROT 8.1 06/20/2022   ALBUMIN 3.6 06/20/2022   AST 33 06/20/2022   ALT 25 06/20/2022   ALKPHOS 91 06/20/2022   BILITOT 0.5 06/20/2022   GFRNONAA >60 06/20/2022   GFRAA >60 04/07/2020    Lab Results  Component  Value Date   WBC 7.5 06/20/2022   NEUTROABS 3.6 06/20/2022   HGB 13.4 06/20/2022   HCT 40.5 06/20/2022   MCV 88.2 06/20/2022   PLT 388 06/20/2022     STUDIES: No results found.  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  She also had port placement at the same time.  She completed 12 weekly cycles of Herceptin plus Taxol on December 26, 2018 and then completed her year-long maintenance Kanjinti (bio similar to Herceptin) on December 24, 2019.  She also completed adjuvant XRT.  Continue anastrozole for a total of 5 years completing treatment in July 2025.  Patient's most recent mammogram on November 15, 2021 was reported as BI-RADS 1.  Repeat in March 2024.  Return to clinic in 6  months with video assisted telemedicine visit.   2.  Osteopenia: Bone mineral densities are monitored by patient's PCP.  Continue calcium and vitamin D supplementation.   3.  Hot flashes/joint pain: Resolved since discontinuing letrozole.  Anastrozole as above. 4.  Seroma: Patient had drainage approximately March 2022.  No further intervention needed at this time.   Patient expressed understanding and was in agreement with this plan. She also understands that She can call clinic at any time with any questions, concerns, or complaints.    Cancer Staging  Malignant neoplasm of upper-inner quadrant of right breast in female, estrogen receptor positive ([place]) Staging form: Breast, AJCC 8th Edition - Clinical stage from 09/06/2018: Stage IA (cT1c, cN0, cM0, G3, ER+, PR+, HER2+) - Signed by Lloyd Huger, MD on 09/06/2018 Histologic grading system: 3 grade system Laterality: Right   Lloyd Huger, MD   06/20/2022 11:52 AM

## 2022-08-11 ENCOUNTER — Ambulatory Visit
Admission: RE | Admit: 2022-08-11 | Discharge: 2022-08-11 | Disposition: A | Discharge status: Home / Self Care | Payer: 59 | Attending: Internal Medicine | Admitting: Internal Medicine

## 2022-08-11 ENCOUNTER — Other Ambulatory Visit: Payer: Self-pay | Admitting: Internal Medicine

## 2022-08-11 ENCOUNTER — Ambulatory Visit
Admission: RE | Admit: 2022-08-11 | Discharge: 2022-08-11 | Disposition: A | Discharge status: Home / Self Care | Payer: 59 | Source: Ambulatory Visit | Attending: Internal Medicine | Admitting: Internal Medicine

## 2022-08-11 DIAGNOSIS — R059 Cough, unspecified: Secondary | ICD-10-CM

## 2022-08-11 DIAGNOSIS — E782 Mixed hyperlipidemia: Secondary | ICD-10-CM | POA: Diagnosis not present

## 2022-08-11 DIAGNOSIS — J209 Acute bronchitis, unspecified: Secondary | ICD-10-CM | POA: Diagnosis not present

## 2022-08-11 DIAGNOSIS — Z20822 Contact with and (suspected) exposure to covid-19: Secondary | ICD-10-CM | POA: Diagnosis not present

## 2022-08-11 DIAGNOSIS — I10 Essential (primary) hypertension: Secondary | ICD-10-CM | POA: Diagnosis not present

## 2022-08-11 DIAGNOSIS — G4733 Obstructive sleep apnea (adult) (pediatric): Secondary | ICD-10-CM | POA: Diagnosis not present

## 2022-08-23 DIAGNOSIS — E782 Mixed hyperlipidemia: Secondary | ICD-10-CM | POA: Diagnosis not present

## 2022-08-23 DIAGNOSIS — E668 Other obesity: Secondary | ICD-10-CM | POA: Diagnosis not present

## 2022-08-23 DIAGNOSIS — G4733 Obstructive sleep apnea (adult) (pediatric): Secondary | ICD-10-CM | POA: Diagnosis not present

## 2022-08-23 DIAGNOSIS — I10 Essential (primary) hypertension: Secondary | ICD-10-CM | POA: Diagnosis not present

## 2022-09-19 DIAGNOSIS — E668 Other obesity: Secondary | ICD-10-CM | POA: Diagnosis not present

## 2022-09-19 DIAGNOSIS — E782 Mixed hyperlipidemia: Secondary | ICD-10-CM | POA: Diagnosis not present

## 2022-09-19 DIAGNOSIS — C50911 Malignant neoplasm of unspecified site of right female breast: Secondary | ICD-10-CM | POA: Diagnosis not present

## 2022-09-19 DIAGNOSIS — E559 Vitamin D deficiency, unspecified: Secondary | ICD-10-CM | POA: Diagnosis not present

## 2022-09-19 DIAGNOSIS — I10 Essential (primary) hypertension: Secondary | ICD-10-CM | POA: Diagnosis not present

## 2022-10-20 ENCOUNTER — Ambulatory Visit: Payer: 59 | Admitting: Family

## 2022-10-27 ENCOUNTER — Encounter: Payer: Self-pay | Admitting: Family

## 2022-10-27 ENCOUNTER — Ambulatory Visit (INDEPENDENT_AMBULATORY_CARE_PROVIDER_SITE_OTHER): Payer: 59 | Admitting: Family

## 2022-10-27 VITALS — BP 132/82 | HR 104 | Ht 66.0 in | Wt 232.0 lb

## 2022-10-27 DIAGNOSIS — Z6837 Body mass index (BMI) 37.0-37.9, adult: Secondary | ICD-10-CM

## 2022-10-27 DIAGNOSIS — F411 Generalized anxiety disorder: Secondary | ICD-10-CM | POA: Diagnosis not present

## 2022-10-27 MED ORDER — FLUTICASONE PROPIONATE 50 MCG/ACT NA SUSP: 2.0000 | Freq: Every day | NASAL | 3 refills | Status: DC | PRN

## 2022-10-27 MED ORDER — PHENDIMETRAZINE TARTRATE ER 105 MG PO CP24: 1.0000 | ORAL_CAPSULE | Freq: Every day | ORAL | 0 refills | Status: DC

## 2022-10-27 MED ORDER — VENLAFAXINE HCL ER 75 MG PO CP24: 75.0000 mg | ORAL_CAPSULE | Freq: Every day | ORAL | 1 refills | Status: DC

## 2022-10-27 NOTE — Progress Notes (Signed)
Established Patient Office Visit  Subjective:  Patient ID: Erin Church, female    DOB: [birth year]  Age: 62 y.o. MRN: [id number]  Chief Complaint  Patient presents with   Follow-up    1 month follow up    Patient is here for her 1 month follow up weight check.  she has lost >4 pounds in the last month and meets the requirements for continuing phentermine therapy.  she denies any side effects.   No other concerns at this time.       Past Medical History:  Diagnosis Date   Anginal pain ([place])    Arthritis    BRCA negative 10/2021   MyRisk neg   Breast cancer ([place])    Carpal tunnel syndrome of right wrist    Coronary atherosclerosis of unspecified type of vessel, native or graft    Hand and foot pain    HER2-positive carcinoma of breast ([place])    Hyperlipidemia    Hypertension    Melanoma ([place]) 2020   MVP (mitral valve prolapse)    Personal history of chemotherapy    Personal history of radiation therapy    Sleep apnea    Vertigo     Social History   Socioeconomic History   Marital status: Widowed    Spouse name: Not on file   Number of children: 3   Years of education: 84   Highest education level: Not on file  Occupational History   Occupation: SELF EMPLOYED  Tobacco Use   Smoking status: Never   Smokeless tobacco: Never  Vaping Use   Vaping Use: Never used  Substance and Sexual Activity   Alcohol use: Yes    Alcohol/week: 3.0 standard drinks of alcohol    Types: 3 Shots of liquor per week    Comment: social drinker   Drug use: No   Sexual activity: Yes    Birth control/protection: Post-menopausal  Other Topics Concern   Not on file  Social History Narrative   Not on file   Social Determinants of Health   Financial Resource Strain: Not on file  Food Insecurity: Not on file  Transportation Needs: Not on file  Physical Activity: Not on file  Stress: Not on file  Social Connections: Not on file  Intimate Partner Violence: Not on file     Family History  Problem Relation Age of Onset   Hypertension Mother    Cancer Mother 38       LUNG   Hypertension Father    Colon cancer Paternal Uncle    Breast cancer Neg Hx     No Known Allergies  Review of Systems  All other systems reviewed and are negative.      Objective:   BP 132/82   Pulse (!) 104   Ht '5\' 6"'$  (1.676 m)   Wt 232 lb (105.2 kg)   SpO2 98%   BMI 37.45 kg/m   Vitals:   10/27/22 0901  BP: 132/82  Pulse: (!) 104  Height: '5\' 6"'$  (1.676 m)  Weight: 232 lb (105.2 kg)  SpO2: 98%  BMI (Calculated): 37.46    Physical Exam Vitals and nursing note reviewed.  Constitutional:      Appearance: Normal appearance. She is obese.  HENT:     Head: Normocephalic.  Eyes:     Pupils: Pupils are equal, round, and reactive to light.  Cardiovascular:     Rate and Rhythm: Normal rate.  Pulmonary:     Effort: Pulmonary effort  is normal.  Neurological:     Mental Status: She is alert.      No results found for any visits on 10/27/22.  No results found for this or any previous visit (from the past 2160 hour(s)).    Assessment & Plan:   Problem List Items Addressed This Visit     BMI 37.0-37.9, adult - Primary    The patient has med the threshold to continue phentermine therapy.  she has been reminded of the weight loss requirements to continue therapy.  she verbalized understanding and agreement with plan of care.        Generalized anxiety disorder    Patient asks if we can restart her on her effexor.  Will send refill for her today.        Relevant Medications   venlafaxine XR (EFFEXOR-XR) 75 MG 24 hr capsule    Return in about 1 month (around 11/27/2022).   Total time spent: 20 minutes  Mechele Claude, FNP  10/27/2022

## 2022-10-27 NOTE — Assessment & Plan Note (Signed)
Patient asks if we can restart her on her effexor.  Will send refill for her today.

## 2022-10-27 NOTE — Assessment & Plan Note (Signed)
The patient has med the threshold to continue phentermine therapy.  she has been reminded of the weight loss requirements to continue therapy.  she verbalized understanding and agreement with plan of care.

## 2022-11-20 DIAGNOSIS — R87810 Cervical high risk human papillomavirus (HPV) DNA test positive: Secondary | ICD-10-CM | POA: Insufficient documentation

## 2022-11-20 NOTE — Progress Notes (Unsigned)
PCP: Mechele Claude, FNP   No chief complaint on file.   HPI:      [name] is a 62 y.o. [phone] whose LMP was No LMP recorded. Patient is postmenopausal., presents today for her annual examination.  Her menses are absent due to menopause. She does have vasomotor sx with arimidex.  Sex activity: single partner, contraception - post menopausal status. She does have vaginal dryness.   Last Pap: 11/15/21  Results were no abnormalities/POS HPV DNA; 11/12/19 Results were: low-grade squamous intraepithelial neoplasia (LGSIL - encompassing HPV,mild dysplasia,CIN I) /neg HPV DNA; repeat pap due today  Hx of STDs: none  Last mammogram: 11/15/21 Results were: normal--routine follow-up in 12 months; has appt today, followed by oncology. There is no FH of breast cancer. There is no FH of ovarian cancer. The patient does do self-breast exams. 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 The patient was treatd with chemo/rad and is currently on anastrazole.  Pt also with history of melanoma 2020. Pt is MyRisk neg 3/23.    Colonoscopy: ~5 yrs ago at [facility] per pt; Repeat due after 5 years and got letter in the mail to schedule appt. Plans to do so.   Tobacco use: The patient denies current or previous tobacco use. Alcohol use: social drinker No drug use Exercise: not active  She does get adequate calcium but not Vitamin D in her diet.  Labs with PCP. DEXA 8/21 with oncology.  Patient Active Problem List   Diagnosis Date Noted   BMI 37.0-37.9, adult 10/27/2022   Generalized anxiety disorder 10/27/2022   LGSIL on Pap smear of cervix 11/14/2021   Malignant neoplasm of upper-inner quadrant of right breast in female, estrogen receptor positive ([place]) 09/05/2018   Menopause 10/23/2017   MVP (mitral valve prolapse) 02/13/2014   Coronary artery disease 02/03/2014   Hyperlipidemia 02/03/2014   Myalgia 02/03/2014   Cardiac function  test normal 02/03/2014   Normal cardiac stress test 02/03/2014    Past Surgical History:  Procedure Laterality Date   BREAST BIOPSY Right 08/27/2018   [place] bx, [facility]   BREAST LUMPECTOMY Right 09/11/2018   BREAST LUMPECTOMY WITH NEEDLE LOCALIZATION AND AXILLARY SENTINEL LYMPH NODE BX Right 09/11/2018   Procedure: BREAST LUMPECTOMY WITH NEEDLE LOCALIZATION AND SENTINEL LYMPH NODE BX;  Surgeon: [name], MD;  Location: [facility];  Service: General;  Laterality: Right;   CARDIAC CATHETERIZATION     CATARACT EXTRACTION W/PHACO Left 01/01/2017   Procedure: CATARACT EXTRACTION PHACO AND INTRAOCULAR LENS PLACEMENT (IOC)left Restor lens;  Surgeon: Leandrew Koyanagi, MD;  Location: [place];  Service: Ophthalmology;  Laterality: Left;  Restor lens   CATARACT EXTRACTION W/PHACO Right 01/31/2017   Procedure: CATARACT EXTRACTION PHACO AND INTRAOCULAR LENS PLACEMENT (IOC);  Surgeon: Leandrew Koyanagi, MD;  Location: [place];  Service: Ophthalmology;  Laterality: Right;   CESAREAN SECTION     CHOLECYSTECTOMY     HYSTEROSCOPY  06/2010   D&C   PORTACATH PLACEMENT Right 09/11/2018   Procedure: INSERTION PORT-A-CATH;  Surgeon: [name], MD;  Location: [facility];  Service: General;  Laterality: Right;   TEE WITHOUT CARDIOVERSION Right 11/02/2020   Procedure: TRANSESOPHAGEAL ECHOCARDIOGRAM (TEE);  Surgeon: Dionisio David, MD;  Location: [facility];  Service: Cardiovascular;  Laterality: Right;   TUBAL LIGATION      Family History  Problem Relation Age of Onset   Hypertension Mother    Cancer  Mother 73       LUNG   Hypertension Father    Colon cancer Paternal Uncle    Breast cancer Neg Hx     Social History   Socioeconomic History   Marital status: Widowed    Spouse name: Not on file   Number of children: 3   Years of education: 51   Highest education level: Not on file  Occupational History   Occupation: SELF EMPLOYED  Tobacco Use   Smoking status: Never    Smokeless tobacco: Never  Vaping Use   Vaping Use: Never used  Substance and Sexual Activity   Alcohol use: Yes    Alcohol/week: 3.0 standard drinks of alcohol    Types: 3 Shots of liquor per week    Comment: social drinker   Drug use: No   Sexual activity: Yes    Birth control/protection: Post-menopausal  Other Topics Concern   Not on file  Social History Narrative   Not on file   Social Determinants of Health   Financial Resource Strain: Not on file  Food Insecurity: Not on file  Transportation Needs: Not on file  Physical Activity: Not on file  Stress: Not on file  Social Connections: Not on file  Intimate Partner Violence: Not on file     Current Outpatient Medications:    amLODipine-benazepril (LOTREL) 10-20 MG per capsule, Take 1 capsule by mouth daily. , Disp: , Rfl:    anastrozole (ARIMIDEX) 1 MG tablet, Take 1 tablet (1 mg total) by mouth daily., Disp: 90 tablet, Rfl: 3   fluticasone (FLONASE) 50 MCG/ACT nasal spray, Place 2 sprays into both nostrils daily as needed., Disp: 16 g, Rfl: 3   loratadine (CLARITIN) 10 MG tablet, Take 10 mg by mouth daily., Disp: , Rfl:    omeprazole (PRILOSEC) 40 MG capsule, Take 40 mg by mouth daily., Disp: , Rfl:    Phendimetrazine Tartrate 105 MG CP24, Take 1 capsule (105 mg total) by mouth daily., Disp: 30 capsule, Rfl: 0   potassium chloride (KLOR-CON M) 10 MEQ tablet, Take 1 tablet (10 mEq total) by mouth daily., Disp: 90 tablet, Rfl: 0   venlafaxine XR (EFFEXOR-XR) 75 MG 24 hr capsule, Take 1 capsule (75 mg total) by mouth daily with breakfast., Disp: 90 capsule, Rfl: 1  Current Facility-Administered Medications:    betamethasone acetate-betamethasone sodium phosphate (CELESTONE) injection 3 mg, 3 mg, Intramuscular, Once, Evans, Brent M, DPM     ROS:  Review of Systems  Constitutional:  Negative for fatigue, fever and unexpected weight change.  Respiratory:  Negative for cough, shortness of breath and wheezing.    Cardiovascular:  Negative for chest pain, palpitations and leg swelling.  Gastrointestinal:  Negative for blood in stool, constipation, diarrhea, nausea and vomiting.  Endocrine: Negative for cold intolerance, heat intolerance and polyuria.  Genitourinary:  Negative for dyspareunia, dysuria, flank pain, frequency, genital sores, hematuria, menstrual problem, pelvic pain, urgency, vaginal bleeding, vaginal discharge and vaginal pain.  Musculoskeletal:  Negative for back pain, joint swelling and myalgias.  Skin:  Negative for rash.  Neurological:  Negative for dizziness, syncope, light-headedness, numbness and headaches.  Hematological:  Negative for adenopathy.  Psychiatric/Behavioral:  Negative for agitation, confusion, sleep disturbance and suicidal ideas. The patient is not nervous/anxious.    BREAST: No symptoms    Objective: There were no vitals taken for this visit.   Physical Exam Constitutional:      Appearance: She is well-developed.  Genitourinary:     Vulva normal.  Right Labia: No rash, tenderness or lesions.    Left Labia: No tenderness, lesions or rash.    No vaginal discharge, erythema or tenderness.      Right Adnexa: not tender and no mass present.    Left Adnexa: not tender and no mass present.    No cervical friability or polyp.     Uterus is not enlarged or tender.  Breasts:    Right: No mass, nipple discharge, skin change or tenderness.     Left: No mass, nipple discharge, skin change or tenderness.  Neck:     Thyroid: No thyromegaly.  Cardiovascular:     Rate and Rhythm: Normal rate and regular rhythm.     Heart sounds: Normal heart sounds. No murmur heard. Pulmonary:     Effort: Pulmonary effort is normal.     Breath sounds: Normal breath sounds.  Abdominal:     Palpations: Abdomen is soft.     Tenderness: There is no abdominal tenderness. There is no guarding or rebound.  Musculoskeletal:        General: Normal range of motion.     Cervical  back: Normal range of motion.  Lymphadenopathy:     Cervical: No cervical adenopathy.  Neurological:     General: No focal deficit present.     Mental Status: She is alert and oriented to person, place, and time.     Cranial Nerves: No cranial nerve deficit.  Skin:    General: Skin is warm and dry.  Psychiatric:        Mood and Affect: Mood normal.        Behavior: Behavior normal.        Thought Content: Thought content normal.        Judgment: Judgment normal.  Vitals reviewed.     Assessment/Plan:  Encounter for annual routine gynecological examination  Cervical cancer screening - Plan: Cytology - PAP  Screening for HPV (human papillomavirus) - Plan: Cytology - PAP  LGSIL on Pap smear of cervix - Plan: Cytology - PAP  Encounter for screening mammogram for malignant neoplasm of breast; pt has mammo today  Screening for colon cancer--pt to call [facility] for scr colonoscopy. Will do ref prn.   Malignant neoplasm of upper-inner quadrant of right breast in female, estrogen receptor positive ([place]) - Plan: Integrated BRACAnalysis ([place]); MyRisk testing discussed and done today. Will f/u with results.           GYN counsel breast self exam, mammography screening, menopause, adequate intake of calcium and vitamin D, diet and exercise    F/U  No follow-ups on file.  Jordon Kristiansen B. Fermina Mishkin, PA-C 11/20/2022 7:07 PM

## 2022-11-21 ENCOUNTER — Ambulatory Visit (INDEPENDENT_AMBULATORY_CARE_PROVIDER_SITE_OTHER): Payer: 59 | Admitting: Obstetrics and Gynecology

## 2022-11-21 ENCOUNTER — Ambulatory Visit
Admission: RE | Admit: 2022-11-21 | Discharge: 2022-11-21 | Disposition: A | Discharge status: Home / Self Care | Payer: 59 | Source: Ambulatory Visit | Attending: Oncology | Admitting: Oncology

## 2022-11-21 ENCOUNTER — Other Ambulatory Visit (HOSPITAL_COMMUNITY)
Admission: RE | Admit: 2022-11-21 | Discharge: 2022-11-21 | Disposition: A | Discharge status: Home / Self Care | Payer: 59 | Source: Ambulatory Visit | Attending: Obstetrics and Gynecology | Admitting: Obstetrics and Gynecology

## 2022-11-21 ENCOUNTER — Encounter: Payer: Self-pay | Admitting: Obstetrics and Gynecology

## 2022-11-21 VITALS — BP 124/80 | Ht 66.0 in | Wt 227.0 lb

## 2022-11-21 DIAGNOSIS — R87810 Cervical high risk human papillomavirus (HPV) DNA test positive: Secondary | ICD-10-CM

## 2022-11-21 DIAGNOSIS — Z1151 Encounter for screening for human papillomavirus (HPV): Secondary | ICD-10-CM

## 2022-11-21 DIAGNOSIS — Z124 Encounter for screening for malignant neoplasm of cervix: Secondary | ICD-10-CM | POA: Insufficient documentation

## 2022-11-21 DIAGNOSIS — C50211 Malignant neoplasm of upper-inner quadrant of right female breast: Secondary | ICD-10-CM | POA: Insufficient documentation

## 2022-11-21 DIAGNOSIS — Z01419 Encounter for gynecological examination (general) (routine) without abnormal findings: Secondary | ICD-10-CM | POA: Diagnosis not present

## 2022-11-21 DIAGNOSIS — Z1231 Encounter for screening mammogram for malignant neoplasm of breast: Secondary | ICD-10-CM

## 2022-11-21 DIAGNOSIS — Z1211 Encounter for screening for malignant neoplasm of colon: Secondary | ICD-10-CM

## 2022-11-21 DIAGNOSIS — Z170 Estrogen receptor positive status [ER+]: Secondary | ICD-10-CM | POA: Insufficient documentation

## 2022-11-21 NOTE — Patient Instructions (Signed)
I value your feedback and you entrusting us with your care. If you get a [facility] patient survey, I would appreciate you taking the time to let us know about your experience today. Thank you! ? ? ?

## 2022-11-23 LAB — CYTOLOGY - PAP
Comment: NEGATIVE
Diagnosis: NEGATIVE
Diagnosis: REACTIVE
High risk HPV: NEGATIVE

## 2022-11-28 ENCOUNTER — Encounter: Payer: Self-pay | Admitting: Family

## 2022-11-28 ENCOUNTER — Ambulatory Visit (INDEPENDENT_AMBULATORY_CARE_PROVIDER_SITE_OTHER): Payer: 59 | Admitting: Family

## 2022-11-28 VITALS — BP 132/78 | HR 95 | Ht 66.0 in | Wt 228.2 lb

## 2022-11-28 DIAGNOSIS — R600 Localized edema: Secondary | ICD-10-CM

## 2022-11-28 DIAGNOSIS — Z6837 Body mass index (BMI) 37.0-37.9, adult: Secondary | ICD-10-CM | POA: Diagnosis not present

## 2022-11-28 MED ORDER — PHENDIMETRAZINE TARTRATE ER 105 MG PO CP24: 1.0000 | ORAL_CAPSULE | Freq: Every day | ORAL | 0 refills | Status: DC

## 2022-11-28 NOTE — Assessment & Plan Note (Signed)
The patient has med the threshold to continue phentermine therapy.  she has been reminded of the weight loss requirements to continue therapy.  she verbalized understanding and agreement with plan of care.   

## 2022-11-28 NOTE — Progress Notes (Signed)
Established Patient Office Visit  Subjective:  Patient ID: Erin Church, female    DOB: [birth year]  Age: 62 y.o. MRN: [id number]  Chief Complaint  Patient presents with   Follow-up    1 month follow up    Patient is here for her 1 month follow up weight check.  she has lost >4 pounds in the last month and meets the requirements for continuing phentermine therapy.  she denies any side effects.   No other concerns at this time.   Past Medical History:  Diagnosis Date   Anginal pain ([place])    Arthritis    BRCA negative 10/2021   MyRisk neg   Breast cancer ([place])    Carpal tunnel syndrome of right wrist    Coronary atherosclerosis of unspecified type of vessel, native or graft    Hand and foot pain    HER2-positive carcinoma of breast ([place])    Hyperlipidemia    Hypertension    Melanoma ([place]) 2020   MVP (mitral valve prolapse)    Personal history of chemotherapy    Personal history of radiation therapy    Sleep apnea    Vertigo     Past Surgical History:  Procedure Laterality Date   BREAST BIOPSY Right 08/27/2018   [place] bx, [facility]   BREAST LUMPECTOMY Right 09/11/2018   BREAST LUMPECTOMY WITH NEEDLE LOCALIZATION AND AXILLARY SENTINEL LYMPH NODE BX Right 09/11/2018   Procedure: BREAST LUMPECTOMY WITH NEEDLE LOCALIZATION AND SENTINEL LYMPH NODE BX;  Surgeon: [name], MD;  Location: [facility];  Service: General;  Laterality: Right;   CARDIAC CATHETERIZATION     CATARACT EXTRACTION W/PHACO Left 01/01/2017   Procedure: CATARACT EXTRACTION PHACO AND INTRAOCULAR LENS PLACEMENT (IOC)left Restor lens;  Surgeon: Leandrew Koyanagi, MD;  Location: [place];  Service: Ophthalmology;  Laterality: Left;  Restor lens   CATARACT EXTRACTION W/PHACO Right 01/31/2017   Procedure: CATARACT EXTRACTION PHACO AND INTRAOCULAR LENS PLACEMENT (IOC);  Surgeon: Leandrew Koyanagi, MD;  Location: [place];  Service: Ophthalmology;  Laterality: Right;   CESAREAN SECTION      CHOLECYSTECTOMY     HYSTEROSCOPY  06/2010   D&C   PORTACATH PLACEMENT Right 09/11/2018   Procedure: INSERTION PORT-A-CATH;  Surgeon: [name], MD;  Location: [facility];  Service: General;  Laterality: Right;   TEE WITHOUT CARDIOVERSION Right 11/02/2020   Procedure: TRANSESOPHAGEAL ECHOCARDIOGRAM (TEE);  Surgeon: Dionisio David, MD;  Location: [facility];  Service: Cardiovascular;  Laterality: Right;   TUBAL LIGATION      Social History   Socioeconomic History   Marital status: Widowed    Spouse name: Not on file   Number of children: 3   Years of education: 17   Highest education level: Not on file  Occupational History   Occupation: SELF EMPLOYED  Tobacco Use   Smoking status: Never   Smokeless tobacco: Never  Vaping Use   Vaping Use: Never used  Substance and Sexual Activity   Alcohol use: Yes    Alcohol/week: 3.0 standard drinks of alcohol    Types: 3 Shots of liquor per week    Comment: social drinker   Drug use: No   Sexual activity: Yes    Birth control/protection: Post-menopausal  Other Topics Concern   Not on file  Social History Narrative   Not on file   Social Determinants of Health   Financial Resource Strain: Not on file  Food Insecurity: Not on file  Transportation Needs: Not on  file  Physical Activity: Not on file  Stress: Not on file  Social Connections: Not on file  Intimate Partner Violence: Not on file    Family History  Problem Relation Age of Onset   Hypertension Mother    Cancer Mother 67       LUNG   Hypertension Father    Colon cancer Paternal Uncle    Breast cancer Neg Hx     No Known Allergies  Review of Systems  All other systems reviewed and are negative.      Objective:   BP 132/78   Pulse 95   Ht 5\' 6"  (1.676 m)   Wt 228 lb 3.2 oz (103.5 kg)   SpO2 96%   BMI 36.83 kg/m   Vitals:   11/28/22 0900  BP: 132/78  Pulse: 95  Height: 5\' 6"  (1.676 m)  Weight: 228 lb 3.2 oz (103.5 kg)  SpO2: 96%  BMI  (Calculated): 36.85    Physical Exam Vitals and nursing note reviewed.  Constitutional:      Appearance: Normal appearance. She is normal weight.  HENT:     Head: Normocephalic.  Eyes:     Pupils: Pupils are equal, round, and reactive to light.  Cardiovascular:     Rate and Rhythm: Normal rate.  Pulmonary:     Effort: Pulmonary effort is normal.  Neurological:     Mental Status: She is alert.      No results found for any visits on 11/28/22.  Recent Results (from the past 2160 hour(s))  Cytology - PAP     Status: None   Collection Time: 11/21/22  8:38 AM  Result Value Ref Range   High risk HPV Negative    Adequacy      Satisfactory for evaluation; transformation zone component PRESENT.   Diagnosis      - Negative for Intraepithelial Lesions or Malignancy (NILM)   Diagnosis - Benign reactive/reparative changes    Microorganisms Shift in flora suggestive of bacterial vaginosis    Comment Normal Reference Range HPV - Negative       Assessment & Plan:   Problem List Items Addressed This Visit     BMI 37.0-37.9, adult - Primary    The patient has med the threshold to continue phentermine therapy.  she has been reminded of the weight loss requirements to continue therapy.  she verbalized understanding and agreement with plan of care.        Other Visit Diagnoses     Edema of both lower legs       suggested patient pick up compression socks to help manage her swelling.       Return in about 1 month (around 12/28/2022) for F/U.   Total time spent: 20 minutes  Mechele Claude, FNP  11/28/2022

## 2022-12-15 ENCOUNTER — Other Ambulatory Visit: Payer: Self-pay | Admitting: *Deleted

## 2022-12-15 ENCOUNTER — Other Ambulatory Visit: Payer: Self-pay

## 2022-12-15 MED ORDER — AMLODIPINE BESY-BENAZEPRIL HCL 10-20 MG PO CAPS: 1.0000 | ORAL_CAPSULE | Freq: Every day | ORAL | 1 refills | Status: DC

## 2022-12-15 MED ORDER — ANASTROZOLE 1 MG PO TABS: 1.0000 mg | ORAL_TABLET | Freq: Every day | ORAL | 0 refills | Status: DC

## 2022-12-20 ENCOUNTER — Inpatient Hospital Stay: Payer: 59 | Admitting: Oncology

## 2022-12-20 ENCOUNTER — Ambulatory Visit: Payer: 59 | Admitting: Oncology

## 2023-01-02 ENCOUNTER — Ambulatory Visit: Payer: 59 | Admitting: Family

## 2023-01-09 ENCOUNTER — Ambulatory Visit: Payer: 59 | Admitting: Family

## 2023-01-09 ENCOUNTER — Inpatient Hospital Stay: Payer: 59 | Attending: Oncology | Admitting: Oncology

## 2023-01-09 ENCOUNTER — Encounter: Payer: Self-pay | Admitting: Oncology

## 2023-01-09 ENCOUNTER — Encounter: Payer: Self-pay | Admitting: Podiatry

## 2023-01-09 ENCOUNTER — Ambulatory Visit: Payer: 59 | Admitting: Podiatry

## 2023-01-09 ENCOUNTER — Ambulatory Visit (INDEPENDENT_AMBULATORY_CARE_PROVIDER_SITE_OTHER): Payer: 59

## 2023-01-09 VITALS — BP 142/96 | HR 88 | Temp 96.6°F | Resp 16 | Ht 66.0 in | Wt 231.0 lb

## 2023-01-09 DIAGNOSIS — Z79811 Long term (current) use of aromatase inhibitors: Secondary | ICD-10-CM | POA: Insufficient documentation

## 2023-01-09 DIAGNOSIS — M722 Plantar fascial fibromatosis: Secondary | ICD-10-CM

## 2023-01-09 DIAGNOSIS — M79671 Pain in right foot: Secondary | ICD-10-CM

## 2023-01-09 DIAGNOSIS — M8580 Other specified disorders of bone density and structure, unspecified site: Secondary | ICD-10-CM | POA: Insufficient documentation

## 2023-01-09 DIAGNOSIS — C50211 Malignant neoplasm of upper-inner quadrant of right female breast: Secondary | ICD-10-CM | POA: Insufficient documentation

## 2023-01-09 DIAGNOSIS — Z170 Estrogen receptor positive status [ER+]: Secondary | ICD-10-CM | POA: Insufficient documentation

## 2023-01-09 DIAGNOSIS — M79672 Pain in left foot: Secondary | ICD-10-CM

## 2023-01-09 MED ORDER — MELOXICAM 15 MG PO TABS: 15.0000 mg | ORAL_TABLET | Freq: Every day | ORAL | 1 refills | Status: DC

## 2023-01-09 MED ORDER — METHYLPREDNISOLONE 4 MG PO TBPK: ORAL_TABLET | ORAL | 0 refills | Status: DC

## 2023-01-09 NOTE — Progress Notes (Signed)
   Chief Complaint  Patient presents with   Plantar Fasciitis    Patient came in today bilateral heel and arch pain, left top of the lateral side of the foot, right ankle, rate of pain 4 out out of 10, X-Rays done today     Subjective: 62 y.o. female presenting today as a reestablish new patient for evaluation of bilateral foot pain.  Patient states that about 2 weeks ago she did an increased amount of yard work and had significant foot pain bilaterally.  She has been taking Tylenol and Aleve and has been improvement over the past 2 weeks.  She also wears good supportive sandals with arch supports   Past Medical History:  Diagnosis Date   Anginal pain ([facility])    Arthritis    BRCA negative 10/2021   MyRisk neg   Breast cancer ([facility])    Carpal tunnel syndrome of right wrist    Coronary atherosclerosis of unspecified type of vessel, native or graft    Hand and foot pain    HER2-positive carcinoma of breast ([facility])    Hyperlipidemia    Hypertension    Melanoma ([facility]) 2020   MVP (mitral valve prolapse)    Personal history of chemotherapy    Personal history of radiation therapy    Sleep apnea    Vertigo      Objective: Physical Exam General: The patient is alert and oriented x3 in no acute distress.  Dermatology: Skin is warm, dry and supple bilateral lower extremities. Negative for open lesions or macerations bilateral.   Vascular: Dorsalis Pedis and Posterior Tibial pulses palpable bilateral.  Capillary fill time is immediate to all digits.  Neurological: Epicritic and protective threshold intact bilateral.   Musculoskeletal: Tenderness to palpation to the plantar aspect of the bilateral heels along the plantar fascia. All other joints range of motion within normal limits bilateral. Strength 5/5 in all groups bilateral.   Radiographic exam B/L feet 01/09/2023: Normal osseous mineralization.  Degenerative changes noted to the second TMT of the bilateral feet left greater than the  right consistent with osteoarthritis.  No fractures identified.  Assessment: 1. plantar fasciitis bilateral feet 2.  Intermittent occasional ankle pain right; currently asymptomatic  Plan of Care:  1. Patient evaluated. Xrays reviewed.   2.  Patient is demonstrating good improvement over the last 2 weeks.  No injections  3. Rx for Medrol Dose Pak placed 4.  Prescription for meloxicam 15 mg daily as needed 5.  Continue wearing good supportive shoes and sneakers 6. Instructed patient regarding therapies and modalities at home to alleviate symptoms.  7. Return to clinic as needed  Felecia Shelling, DPM [facility]  Dr. Felecia Shelling, DPM    [address] [place], [place] [address]                Office [phone]  Fax [phone]

## 2023-01-09 NOTE — Progress Notes (Signed)
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  INTERVAL HISTORY: Patient returns to clinic today for routine 38-month evaluation.  She currently feels well and is asymptomatic.  She remains active taking care of her 2 grandchildren.  She is tolerating anastrozole without significant side effects. She has no neurologic complaints.  She denies any recent fevers or illnesses.  She has a good appetite and denies weight loss.  She denies any chest pain, shortness of breath, cough, or hemoptysis.  She denies any nausea, vomiting, constipation, or diarrhea.  She has no urinary complaints.  Patient offers no further specific complaints today.  Patient offers no specific complaints today.  REVIEW OF SYSTEMS:   Review of Systems  Constitutional: Negative.  Negative for fever, malaise/fatigue and weight loss.  Respiratory: Negative.  Negative for cough, hemoptysis and shortness of breath.   Cardiovascular: Negative.  Negative for chest pain and leg swelling.  Gastrointestinal: Negative.  Negative for abdominal pain, blood in stool and melena.  Genitourinary: Negative.  Negative for dysuria.  Musculoskeletal: Negative.  Negative for back pain.  Skin: Negative.  Negative for rash.  Neurological: Negative.  Negative for seizures, weakness and headaches.  Psychiatric/Behavioral: Negative.  The patient is not nervous/anxious.     As per HPI. Otherwise, a  complete review of systems is negative.  PAST MEDICAL HISTORY: Past Medical History:  Diagnosis Date   Anginal pain (HCC)    Arthritis    BRCA negative 10/2021   MyRisk neg   Breast cancer (HCC)    Carpal tunnel syndrome of right wrist    Coronary atherosclerosis of unspecified type of vessel, native or graft    Hand and foot pain    HER2-positive carcinoma of breast (HCC)    Hyperlipidemia    Hypertension    Melanoma (HCC) 2020   MVP (mitral valve prolapse)    Personal history of chemotherapy    Personal history of radiation therapy    Sleep apnea    Vertigo     PAST SURGICAL HISTORY: Past Surgical History:  Procedure Laterality Date   BREAST BIOPSY Right 08/27/2018   [place] bx, [facility]   BREAST LUMPECTOMY Right 09/11/2018   BREAST LUMPECTOMY WITH NEEDLE LOCALIZATION AND AXILLARY SENTINEL LYMPH NODE BX Right 09/11/2018   Procedure: BREAST LUMPECTOMY WITH NEEDLE LOCALIZATION AND SENTINEL LYMPH NODE BX;  Surgeon: Ancil Linsey, MD;  Location: [facility] ORS;  Service: General;  Laterality: Right;   CARDIAC CATHETERIZATION     CATARACT EXTRACTION W/PHACO Left 01/01/2017   Procedure: CATARACT EXTRACTION PHACO AND INTRAOCULAR LENS PLACEMENT (IOC)left Restor lens;  Surgeon: Lockie Mola, MD;  Location: [facility];  Service: Ophthalmology;  Laterality: Left;  Restor lens   CATARACT EXTRACTION W/PHACO Right 01/31/2017   Procedure: CATARACT EXTRACTION PHACO AND INTRAOCULAR LENS PLACEMENT (IOC);  Surgeon: Lockie Mola, MD;  Location: [facility];  Service: Ophthalmology;  Laterality: Right;   CESAREAN SECTION     CHOLECYSTECTOMY     HYSTEROSCOPY  06/2010   D&C   PORTACATH PLACEMENT Right  09/11/2018   Procedure: INSERTION PORT-A-CATH;  Surgeon: Ancil Linsey, MD;  Location: [facility] ORS;  Service: General;  Laterality: Right;   TEE WITHOUT CARDIOVERSION Right 11/02/2020   Procedure: TRANSESOPHAGEAL ECHOCARDIOGRAM (TEE);  Surgeon: Laurier Nancy, MD;  Location: [facility]  ORS;  Service: Cardiovascular;  Laterality: Right;   TUBAL LIGATION      FAMILY HISTORY: Family History  Problem Relation Age of Onset   Hypertension Mother    Cancer Mother 52       LUNG   Hypertension Father    Colon cancer Paternal Uncle    Breast cancer Neg Hx     ADVANCED DIRECTIVES (Y/N):  N  HEALTH MAINTENANCE: Social History   Tobacco Use   Smoking status: Never   Smokeless tobacco: Never  Vaping Use   Vaping Use: Never used  Substance Use Topics   Alcohol use: Yes    Alcohol/week: 3.0 standard drinks of alcohol    Types: 3 Shots of liquor per week    Comment: social drinker   Drug use: No     Colonoscopy:  PAP:  Bone density:  Lipid panel:  No Known Allergies  Current Outpatient Medications  Medication Sig Dispense Refill   amLODipine-benazepril (LOTREL) 10-20 MG capsule Take 1 capsule by mouth daily. 90 capsule 1   anastrozole (ARIMIDEX) 1 MG tablet Take 1 tablet (1 mg total) by mouth daily. 90 tablet 0   fluticasone (FLONASE) 50 MCG/ACT nasal spray Place 2 sprays into both nostrils daily as needed. 16 g 3   loratadine (CLARITIN) 10 MG tablet Take 10 mg by mouth daily.     meloxicam (MOBIC) 15 MG tablet Take 1 tablet (15 mg total) by mouth daily. 30 tablet 1   methylPREDNISolone (MEDROL DOSEPAK) 4 MG TBPK tablet 6 day dose pack - take as directed 21 tablet 0   omeprazole (PRILOSEC) 40 MG capsule Take 40 mg by mouth daily.     Phendimetrazine Tartrate 105 MG CP24 Take 1 capsule (105 mg total) by mouth daily. 30 capsule 0   venlafaxine XR (EFFEXOR-XR) 75 MG 24 hr capsule Take 1 capsule (75 mg total) by mouth daily with breakfast. 90 capsule 1   Current Facility-Administered Medications  Medication Dose Route Frequency Provider Last Rate Last Admin   betamethasone acetate-betamethasone sodium phosphate (CELESTONE) injection 3 mg  3 mg Intramuscular Once Felecia Shelling, DPM        OBJECTIVE: Vitals:   01/09/23 1115  BP: (!) 142/96  Pulse: 88  Resp:  16  Temp: (!) 96.6 F (35.9 C)  SpO2: 98%     Body mass index is 37.28 kg/m.    ECOG FS:0 - Asymptomatic  General: Well-developed, well-nourished, no acute distress. Eyes: Pink conjunctiva, anicteric sclera. HEENT: Normocephalic, moist mucous membranes. Breast: Exam deferred today. Lungs: No audible wheezing or coughing. Heart: Regular rate and rhythm. Abdomen: Soft, nontender, no obvious distention. Musculoskeletal: No edema, cyanosis, or clubbing. Neuro: Alert, answering all questions appropriately. Cranial nerves grossly intact. Skin: No rashes or petechiae noted. Psych: Normal affect.  LAB RESULTS:  Lab Results  Component Value Date   NA 133 (L) 06/20/2022   K 4.2 06/20/2022   CL 106 06/20/2022   CO2 20 (L) 06/20/2022   GLUCOSE 100 (H) 06/20/2022   BUN 14 06/20/2022   CREATININE 0.59 06/20/2022   CALCIUM 8.8 (L) 06/20/2022   PROT 8.1 06/20/2022   ALBUMIN 3.6 06/20/2022   AST 33 06/20/2022   ALT 25 06/20/2022   ALKPHOS  91 06/20/2022   BILITOT 0.5 06/20/2022   GFRNONAA >60 06/20/2022   GFRAA >60 04/07/2020    Lab Results  Component Value Date   WBC 7.5 06/20/2022   NEUTROABS 3.6 06/20/2022   HGB 13.4 06/20/2022   HCT 40.5 06/20/2022   MCV 88.2 06/20/2022   PLT 388 06/20/2022     STUDIES: No results found.  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  She completed 12 weekly cycles of Herceptin plus Taxol on December 26, 2018 and then completed her year-long maintenance Kanjinti (bio similar to Herceptin) on December 24, 2019.  She also completed adjuvant XRT.  Continue anastrozole for a total of 5 years completing treatment in July 2025.  Her most recent mammogram on November 21, 2022  was reported as BI-RADS 1.  Repeat in March 2025.  Patient will have video-assisted telemedicine visit in 6 months for further evaluation.  Osteopenia: Bone mineral densities are monitored by patient's PCP.  Continue calcium and vitamin D supplementation.    I spent a total of 20 minutes reviewing chart data, face-to-face evaluation with the patient, counseling and coordination of care as detailed above.    Patient expressed understanding and was in agreement with this plan. She also understands that She can call clinic at any time with any questions, concerns, or complaints.    Cancer Staging  Malignant neoplasm of upper-inner quadrant of right breast in female, estrogen receptor positive (HCC) Staging form: Breast, AJCC 8th Edition - Clinical stage from 09/06/2018: Stage IA (cT1c, cN0, cM0, G3, ER+, PR+, HER2+) - Signed by Jeralyn Ruths, MD on 09/06/2018 Histologic grading system: 3 grade system Laterality: Right   Jeralyn Ruths, MD   01/09/2023 11:54 AM

## 2023-01-16 ENCOUNTER — Ambulatory Visit (INDEPENDENT_AMBULATORY_CARE_PROVIDER_SITE_OTHER): Payer: 59 | Admitting: Family

## 2023-01-16 VITALS — BP 134/68 | HR 82 | Ht 66.0 in | Wt 229.8 lb

## 2023-01-16 DIAGNOSIS — Z6837 Body mass index (BMI) 37.0-37.9, adult: Secondary | ICD-10-CM | POA: Diagnosis not present

## 2023-01-16 MED ORDER — PHENDIMETRAZINE TARTRATE ER 105 MG PO CP24: 1.0000 | ORAL_CAPSULE | Freq: Every day | ORAL | 0 refills | Status: DC

## 2023-01-16 NOTE — Progress Notes (Unsigned)
Established Patient Office Visit  Subjective:  Patient ID: SHAUNTRELL SIMENTAL, female    DOB: 07/05/1961  Age: 62 y.o. MRN: [id number]  Chief Complaint  Patient presents with   Follow-up    1 month follow up    Patient is here for her 1 month follow up weight check.  She has lost significant weight in the last month and meets the requirements for continuing phentermine therapy.  She denies any side effects.      No other concerns at this time.   Past Medical History:  Diagnosis Date   Anginal pain (HCC)    Arthritis    BRCA negative 10/2021   MyRisk neg   Breast cancer (HCC)    Carpal tunnel syndrome of right wrist    Coronary atherosclerosis of unspecified type of vessel, native or graft    Hand and foot pain    HER2-positive carcinoma of breast (HCC)    Hyperlipidemia    Hypertension    Melanoma (HCC) 2020   MVP (mitral valve prolapse)    Personal history of chemotherapy    Personal history of radiation therapy    Sleep apnea    Vertigo     Past Surgical History:  Procedure Laterality Date   BREAST BIOPSY Right 08/27/2018   [place] bx, [facility]   BREAST LUMPECTOMY Right 09/11/2018   BREAST LUMPECTOMY WITH NEEDLE LOCALIZATION AND AXILLARY SENTINEL LYMPH NODE BX Right 09/11/2018   Procedure: BREAST LUMPECTOMY WITH NEEDLE LOCALIZATION AND SENTINEL LYMPH NODE BX;  Surgeon: Ancil Linsey, MD;  Location: [facility] OR[facility];  Service: General;  Laterality: Right;   CARDIAC CATHETERIZATION     CATARACT EXTRACTION W/PHACO Left 01/01/2017   Procedure: CATARACT EXTRACTION PHACO AND INTRAOCULAR LENS PLACEMENT (IOC)left Restor lens;  Surgeon: Lockie Mola, MD;  Location: [facility];  Service: Ophthalmology;  Laterality: Left;  Restor lens   CATARACT EXTRACTION W/PHACO Right 01/31/2017   Procedure: CATARACT EXTRACTION PHACO AND INTRAOCULAR LENS PLACEMENT (IOC);  Surgeon: Lockie Mola, MD;  Location: [facility];  Service: Ophthalmology;  Laterality: Right;    CESAREAN SECTION     CHOLECYSTECTOMY     HYSTEROSCOPY  06/2010   D&C   PORTACATH PLACEMENT Right 09/11/2018   Procedure: INSERTION PORT-A-CATH;  Surgeon: Ancil Linsey, MD;  Location: [facility];  Service: General;  Laterality: Right;   TEE WITHOUT CARDIOVERSION Right 11/02/2020   Procedure: TRANSESOPHAGEAL ECHOCARDIOGRAM (TEE);  Surgeon: Laurier Nancy, MD;  Location: [facility];  Service: Cardiovascular;  Laterality: Right;   TUBAL LIGATION      Social History   Socioeconomic History   Marital status: Widowed    Spouse name: Not on file   Number of children: 3   Years of education: 37   Highest education level: Not on file  Occupational History   Occupation: SELF EMPLOYED  Tobacco Use   Smoking status: Never   Smokeless tobacco: Never  Vaping Use   Vaping Use: Never used  Substance and Sexual Activity   Alcohol use: Yes    Alcohol/week: 3.0 standard drinks of alcohol    Types: 3 Shots of liquor per week    Comment: social drinker   Drug use: No   Sexual activity: Yes    Birth control/protection: Post-menopausal  Other Topics Concern   Not on file  Social History Narrative   Not on file   Social Determinants of Health   Financial Resource Strain: Not on file  Food Insecurity: Not on file  Transportation  Needs: Not on file  Physical Activity: Not on file  Stress: Not on file  Social Connections: Not on file  Intimate Partner Violence: Not on file    Family History  Problem Relation Age of Onset   Hypertension Mother    Cancer Mother 67       LUNG   Hypertension Father    Colon cancer Paternal Uncle    Breast cancer Neg Hx     No Known Allergies  Review of Systems  All other systems reviewed and are negative.      Objective:   BP 134/68   Pulse 82   Ht 5\' 6"  (1.676 m)   Wt 229 lb 12.8 oz (104.2 kg)   SpO2 96%   BMI 37.09 kg/m   Vitals:   01/16/23 0959  BP: 134/68  Pulse: 82  Height: 5\' 6"  (1.676 m)  Weight: 229 lb 12.8 oz (104.2 kg)   SpO2: 96%  BMI (Calculated): 37.11    Physical Exam Vitals and nursing note reviewed.  Constitutional:      Appearance: Normal appearance. She is obese.  HENT:     Head: Normocephalic.  Eyes:     Extraocular Movements: Extraocular movements intact.     Conjunctiva/sclera: Conjunctivae normal.     Pupils: Pupils are equal, round, and reactive to light.  Cardiovascular:     Rate and Rhythm: Normal rate.  Pulmonary:     Effort: Pulmonary effort is normal.  Musculoskeletal:        General: Normal range of motion.  Neurological:     General: No focal deficit present.     Mental Status: She is alert and oriented to person, place, and time. Mental status is at baseline.  Psychiatric:        Mood and Affect: Mood normal.        Behavior: Behavior normal.        Thought Content: Thought content normal.        Judgment: Judgment normal.      No results found for any visits on 01/16/23.  Recent Results (from the past 2160 hour(s))  Cytology - PAP     Status: None   Collection Time: 11/21/22  8:38 AM  Result Value Ref Range   High risk HPV Negative    Adequacy      Satisfactory for evaluation; transformation zone component PRESENT.   Diagnosis      - Negative for Intraepithelial Lesions or Malignancy (NILM)   Diagnosis - Benign reactive/reparative changes    Microorganisms Shift in flora suggestive of bacterial vaginosis    Comment Normal Reference Range HPV - Negative        Assessment & Plan:   Problem List Items Addressed This Visit       Active Problems   BMI 37.0-37.9, adult - Primary    The patient has med the threshold to continue phentermine therapy.  she has been reminded of the weight loss requirements to continue therapy.  she verbalized understanding and agreement with plan of care.   Continue current meds.  Will adjust as needed based on results.  The patient is asked to make an attempt to improve diet and exercise patterns to aid in medical management  of this problem. Addressed importance of increasing and maintaining water intake.         Return in about 1 month (around 02/16/2023) for F/U.   Total time spent: 20 minutes  Miki Kins, FNP  01/16/2023  This document may have been prepared by [employer] Voice Recognition software and as such may include unintentional dictation errors.

## 2023-01-17 ENCOUNTER — Encounter: Payer: Self-pay | Admitting: Family

## 2023-01-17 DIAGNOSIS — D473 Essential (hemorrhagic) thrombocythemia: Secondary | ICD-10-CM | POA: Insufficient documentation

## 2023-01-17 NOTE — Assessment & Plan Note (Addendum)
The patient has med the threshold to continue phentermine therapy.  she has been reminded of the weight loss requirements to continue therapy.  she verbalized understanding and agreement with plan of care.  Continue current meds.  Will adjust as needed based on results.  The patient is asked to make an attempt to improve diet and exercise patterns to aid in medical management of this problem. Addressed importance of increasing and maintaining water intake.   

## 2023-01-17 NOTE — Assessment & Plan Note (Deleted)
Will check labwork at follow up.  Patient has been well managed.   Reassess at follow up.

## 2023-01-18 MED ORDER — PHENDIMETRAZINE TARTRATE ER 105 MG PO CP24: 1.0000 | ORAL_CAPSULE | Freq: Every day | ORAL | 0 refills | Status: DC

## 2023-01-18 NOTE — Addendum Note (Signed)
Addended by: Grayling Congress on: 01/18/2023 09:16 AM   Modules accepted: Orders

## 2023-02-16 ENCOUNTER — Ambulatory Visit: Payer: 59 | Admitting: Family

## 2023-02-20 ENCOUNTER — Ambulatory Visit (INDEPENDENT_AMBULATORY_CARE_PROVIDER_SITE_OTHER): Payer: 59 | Admitting: Family

## 2023-02-20 ENCOUNTER — Encounter: Payer: Self-pay | Admitting: Family

## 2023-02-20 VITALS — BP 122/72 | HR 86 | Ht 66.0 in | Wt 232.0 lb

## 2023-02-20 DIAGNOSIS — Z6837 Body mass index (BMI) 37.0-37.9, adult: Secondary | ICD-10-CM

## 2023-02-23 ENCOUNTER — Other Ambulatory Visit: Payer: Self-pay | Admitting: Family

## 2023-02-24 ENCOUNTER — Other Ambulatory Visit: Payer: Self-pay | Admitting: Podiatry

## 2023-02-25 NOTE — Telephone Encounter (Signed)
Patient requested this medication be discontinued when at her PCP office

## 2023-02-27 MED ORDER — PHENDIMETRAZINE TARTRATE ER 105 MG PO CP24: 1.0000 | ORAL_CAPSULE | Freq: Every day | ORAL | 0 refills | Status: DC

## 2023-03-01 ENCOUNTER — Encounter: Payer: Self-pay | Admitting: Family

## 2023-03-01 NOTE — Assessment & Plan Note (Signed)
The patient has med the threshold to continue phentermine therapy.  she has been reminded of the weight loss requirements to continue therapy.  she verbalized understanding and agreement with plan of care.   

## 2023-03-01 NOTE — Progress Notes (Signed)
Established Patient Office Visit  Subjective:  Patient ID: Erin Church, female    DOB: [birth year]  Age: 62 y.o. MRN: [id number]  Chief Complaint  Patient presents with   Follow-up    1 month follow up    Patient is here for her 1 month follow up weight check.  she has lost >4 pounds in the last month and meets the requirements for continuing phentermine therapy.  she denies any side effects.   No other concerns at this time.     Past Medical History:  Diagnosis Date   Anginal pain (HCC)    Arthritis    BRCA negative 10/2021   MyRisk neg   Breast cancer (HCC)    Carpal tunnel syndrome of right wrist    Coronary atherosclerosis of unspecified type of vessel, native or graft    Hand and foot pain    HER2-positive carcinoma of breast (HCC)    Hyperlipidemia    Hypertension    Melanoma (HCC) 2020   MVP (mitral valve prolapse)    Personal history of chemotherapy    Personal history of radiation therapy    Sleep apnea    Vertigo     Past Surgical History:  Procedure Laterality Date   BREAST BIOPSY Right 08/27/2018   [place] bx, [facility]   BREAST LUMPECTOMY Right 09/11/2018   BREAST LUMPECTOMY WITH NEEDLE LOCALIZATION AND AXILLARY SENTINEL LYMPH NODE BX Right 09/11/2018   Procedure: BREAST LUMPECTOMY WITH NEEDLE LOCALIZATION AND SENTINEL LYMPH NODE BX;  Surgeon: Ancil Linsey, MD;  Location: [facility] OR[facility];  Service: General;  Laterality: Right;   CARDIAC CATHETERIZATION     CATARACT EXTRACTION W/PHACO Left 01/01/2017   Procedure: CATARACT EXTRACTION PHACO AND INTRAOCULAR LENS PLACEMENT (IOC)left Restor lens;  Surgeon: Lockie Mola, MD;  Location: [facility];  Service: Ophthalmology;  Laterality: Left;  Restor lens   CATARACT EXTRACTION W/PHACO Right 01/31/2017   Procedure: CATARACT EXTRACTION PHACO AND INTRAOCULAR LENS PLACEMENT (IOC);  Surgeon: Lockie Mola, MD;  Location: [facility];  Service: Ophthalmology;  Laterality: Right;   CESAREAN SECTION      CHOLECYSTECTOMY     HYSTEROSCOPY  06/2010   D&C   PORTACATH PLACEMENT Right 09/11/2018   Procedure: INSERTION PORT-A-CATH;  Surgeon: Ancil Linsey, MD;  Location: [facility];  Service: General;  Laterality: Right;   TEE WITHOUT CARDIOVERSION Right 11/02/2020   Procedure: TRANSESOPHAGEAL ECHOCARDIOGRAM (TEE);  Surgeon: Laurier Nancy, MD;  Location: [facility];  Service: Cardiovascular;  Laterality: Right;   TUBAL LIGATION      Social History   Socioeconomic History   Marital status: Widowed    Spouse name: Not on file   Number of children: 3   Years of education: 38   Highest education level: Not on file  Occupational History   Occupation: SELF EMPLOYED  Tobacco Use   Smoking status: Never   Smokeless tobacco: Never  Vaping Use   Vaping Use: Never used  Substance and Sexual Activity   Alcohol use: Yes    Alcohol/week: 3.0 standard drinks of alcohol    Types: 3 Shots of liquor per week    Comment: social drinker   Drug use: No   Sexual activity: Yes    Birth control/protection: Post-menopausal  Other Topics Concern   Not on file  Social History Narrative   Not on file   Social Determinants of Health   Financial Resource Strain: Not on file  Food Insecurity: Not on file  Transportation Needs:  Not on file  Physical Activity: Not on file  Stress: Not on file  Social Connections: Not on file  Intimate Partner Violence: Not on file    Family History  Problem Relation Age of Onset   Hypertension Mother    Cancer Mother 47       LUNG   Hypertension Father    Colon cancer Paternal Uncle    Breast cancer Neg Hx     No Known Allergies  Review of Systems  All other systems reviewed and are negative.      Objective:   BP 122/72   Pulse 86   Ht 5\' 6"  (1.676 m)   Wt 232 lb (105.2 kg)   SpO2 96%   BMI 37.45 kg/m   Vitals:   02/20/23 1129  BP: 122/72  Pulse: 86  Height: 5\' 6"  (1.676 m)  Weight: 232 lb (105.2 kg)  SpO2: 96%  BMI (Calculated): 37.46     Physical Exam Vitals and nursing note reviewed.  Constitutional:      Appearance: Normal appearance. She is obese.  HENT:     Head: Normocephalic.  Eyes:     Pupils: Pupils are equal, round, and reactive to light.  Cardiovascular:     Rate and Rhythm: Normal rate.  Pulmonary:     Effort: Pulmonary effort is normal.  Neurological:     Mental Status: She is alert.  Psychiatric:        Mood and Affect: Mood normal.        Behavior: Behavior normal.        Thought Content: Thought content normal.        Judgment: Judgment normal.      No results found for any visits on 02/20/23.  No results found for this or any previous visit (from the past 2160 hour(s)).     Assessment & Plan:   Problem List Items Addressed This Visit       Active Problems   BMI 37.0-37.9, adult - Primary    The patient has med the threshold to continue phentermine therapy.  she has been reminded of the weight loss requirements to continue therapy.  she verbalized understanding and agreement with plan of care.         Return in about 1 month (around 03/22/2023).   Total time spent: 20 minutes  Miki Kins, FNP  02/20/2023   This document may have been prepared by [facility] Voice Recognition software and as such may include unintentional dictation errors.

## 2023-03-20 ENCOUNTER — Other Ambulatory Visit: Payer: Self-pay | Admitting: Oncology

## 2023-03-23 ENCOUNTER — Ambulatory Visit: Payer: 59 | Admitting: Family

## 2023-03-28 ENCOUNTER — Other Ambulatory Visit: Payer: Self-pay

## 2023-03-28 ENCOUNTER — Encounter: Payer: Self-pay | Admitting: Family

## 2023-04-02 ENCOUNTER — Other Ambulatory Visit: Payer: Self-pay

## 2023-04-02 MED ORDER — LINACLOTIDE 290 MCG PO CAPS: 290.0000 ug | ORAL_CAPSULE | Freq: Every day | ORAL | 3 refills | Status: DC

## 2023-04-03 ENCOUNTER — Other Ambulatory Visit: Payer: Self-pay | Admitting: Family

## 2023-04-04 MED ORDER — PHENDIMETRAZINE TARTRATE ER 105 MG PO CP24: 1.0000 | ORAL_CAPSULE | Freq: Every day | ORAL | 0 refills | Status: DC

## 2023-04-06 ENCOUNTER — Other Ambulatory Visit: Payer: Self-pay | Admitting: Family

## 2023-04-06 MED ORDER — PHENDIMETRAZINE TARTRATE ER 105 MG PO CP24: 1.0000 | ORAL_CAPSULE | Freq: Every day | ORAL | 0 refills | Status: DC

## 2023-04-17 ENCOUNTER — Encounter: Payer: Self-pay | Admitting: Podiatry

## 2023-04-17 ENCOUNTER — Ambulatory Visit: Payer: 59 | Admitting: Podiatry

## 2023-04-17 ENCOUNTER — Ambulatory Visit (INDEPENDENT_AMBULATORY_CARE_PROVIDER_SITE_OTHER): Payer: 59 | Admitting: Family

## 2023-04-17 VITALS — BP 140/100 | HR 94 | Ht 66.0 in | Wt 229.8 lb

## 2023-04-17 DIAGNOSIS — R7303 Prediabetes: Secondary | ICD-10-CM

## 2023-04-17 DIAGNOSIS — M722 Plantar fascial fibromatosis: Secondary | ICD-10-CM | POA: Diagnosis not present

## 2023-04-17 DIAGNOSIS — Z6837 Body mass index (BMI) 37.0-37.9, adult: Secondary | ICD-10-CM | POA: Diagnosis not present

## 2023-04-17 DIAGNOSIS — D473 Essential (hemorrhagic) thrombocythemia: Secondary | ICD-10-CM | POA: Diagnosis not present

## 2023-04-17 DIAGNOSIS — I10 Essential (primary) hypertension: Secondary | ICD-10-CM

## 2023-04-17 DIAGNOSIS — E559 Vitamin D deficiency, unspecified: Secondary | ICD-10-CM

## 2023-04-17 DIAGNOSIS — E782 Mixed hyperlipidemia: Secondary | ICD-10-CM | POA: Diagnosis not present

## 2023-04-17 DIAGNOSIS — E039 Hypothyroidism, unspecified: Secondary | ICD-10-CM

## 2023-04-17 DIAGNOSIS — E538 Deficiency of other specified B group vitamins: Secondary | ICD-10-CM

## 2023-04-17 MED ORDER — BETAMETHASONE SOD PHOS & ACET 6 (3-3) MG/ML IJ SUSP
3.0000 mg | Freq: Once | INTRAMUSCULAR | Status: AC
  Administered 2023-04-17: 3 mg via INTRA_ARTICULAR

## 2023-04-17 MED ORDER — MELOXICAM 15 MG PO TABS: 15.0000 mg | ORAL_TABLET | Freq: Every day | ORAL | 1 refills | Status: DC

## 2023-04-17 NOTE — Progress Notes (Signed)
Established Patient Office Visit  Subjective:  Patient ID: Erin Church, female    DOB: 14-Jun-1961  Age: 62 y.o. MRN: [id number]  Chief Complaint  Patient presents with   Follow-up    Check up    Patient is here for her 1 month follow up weight check.  she has lost >4 pounds in the last month and meets the requirements for continuing phentermine therapy.  she denies any side effects.   No other concerns at this time.     Past Medical History:  Diagnosis Date   Anginal pain (HCC)    Arthritis    BRCA negative 10/2021   MyRisk neg   Breast cancer (HCC)    Carpal tunnel syndrome of right wrist    Coronary atherosclerosis of unspecified type of vessel, native or graft    Hand and foot pain    HER2-positive carcinoma of breast (HCC)    Hyperlipidemia    Hypertension    Melanoma (HCC) 2020   MVP (mitral valve prolapse)    Personal history of chemotherapy    Personal history of radiation therapy    Sleep apnea    Vertigo     Past Surgical History:  Procedure Laterality Date   BREAST BIOPSY Right 08/27/2018   [place] bx, [facility]   BREAST LUMPECTOMY Right 09/11/2018   BREAST LUMPECTOMY WITH NEEDLE LOCALIZATION AND AXILLARY SENTINEL LYMPH NODE BX Right 09/11/2018   Procedure: BREAST LUMPECTOMY WITH NEEDLE LOCALIZATION AND SENTINEL LYMPH NODE BX;  Surgeon: Ancil Linsey, MD;  Location: [facility] ORS;  Service: General;  Laterality: Right;   CARDIAC CATHETERIZATION     CATARACT EXTRACTION W/PHACO Left 01/01/2017   Procedure: CATARACT EXTRACTION PHACO AND INTRAOCULAR LENS PLACEMENT (IOC)left Restor lens;  Surgeon: Lockie Mola, MD;  Location: [facility];  Service: Ophthalmology;  Laterality: Left;  Restor lens   CATARACT EXTRACTION W/PHACO Right 01/31/2017   Procedure: CATARACT EXTRACTION PHACO AND INTRAOCULAR LENS PLACEMENT (IOC);  Surgeon: Lockie Mola, MD;  Location: [facility];  Service: Ophthalmology;  Laterality: Right;   CESAREAN SECTION      CHOLECYSTECTOMY     HYSTEROSCOPY  06/2010   D&C   PORTACATH PLACEMENT Right 09/11/2018   Procedure: INSERTION PORT-A-CATH;  Surgeon: Ancil Linsey, MD;  Location: [facility] ORS;  Service: General;  Laterality: Right;   TEE WITHOUT CARDIOVERSION Right 11/02/2020   Procedure: TRANSESOPHAGEAL ECHOCARDIOGRAM (TEE);  Surgeon: Laurier Nancy, MD;  Location: [facility] OR[facility];  Service: Cardiovascular;  Laterality: Right;   TUBAL LIGATION      Social History   Socioeconomic History   Marital status: Widowed    Spouse name: Not on file   Number of children: 3   Years of education: 19   Highest education level: Not on file  Occupational History   Occupation: SELF EMPLOYED  Tobacco Use   Smoking status: Never   Smokeless tobacco: Never  Vaping Use   Vaping status: Never Used  Substance and Sexual Activity   Alcohol use: Yes    Alcohol/week: 3.0 standard drinks of alcohol    Types: 3 Shots of liquor per week    Comment: social drinker   Drug use: No   Sexual activity: Yes    Birth control/protection: Post-menopausal  Other Topics Concern   Not on file  Social History Narrative   Not on file   Social Determinants of Health   Financial Resource Strain: Not on file  Food Insecurity: Not on file  Transportation Needs: Not on  file  Physical Activity: Not on file  Stress: Not on file  Social Connections: Not on file  Intimate Partner Violence: Not on file    Family History  Problem Relation Age of Onset   Hypertension Mother    Cancer Mother 76       LUNG   Hypertension Father    Colon cancer Paternal Uncle    Breast cancer Neg Hx     No Known Allergies  Review of Systems  All other systems reviewed and are negative.      Objective:   BP (!) 140/100   Pulse 94   Ht 5\' 6"  (1.676 m)   Wt 229 lb 12.8 oz (104.2 kg)   SpO2 97%   BMI 37.09 kg/m   Vitals:   04/17/23 0901  BP: (!) 140/100  Pulse: 94  Height: 5\' 6"  (1.676 m)  Weight: 229 lb 12.8 oz (104.2 kg)  SpO2: 97%   BMI (Calculated): 37.11    Physical Exam Vitals and nursing note reviewed.  Constitutional:      Appearance: Normal appearance. She is normal weight.  HENT:     Head: Normocephalic.  Eyes:     Extraocular Movements: Extraocular movements intact.     Conjunctiva/sclera: Conjunctivae normal.     Pupils: Pupils are equal, round, and reactive to light.  Cardiovascular:     Rate and Rhythm: Normal rate.  Pulmonary:     Effort: Pulmonary effort is normal.  Neurological:     General: No focal deficit present.     Mental Status: She is alert and oriented to person, place, and time. Mental status is at baseline.  Psychiatric:        Mood and Affect: Mood normal.        Behavior: Behavior normal.        Thought Content: Thought content normal.        Judgment: Judgment normal.      No results found for any visits on 04/17/23.  No results found for this or any previous visit (from the past 2160 hour(s)).     Assessment & Plan:   Problem List Items Addressed This Visit       Active Problems   Hyperlipidemia (Chronic)    Checking labs today.  Continue current therapy for lipid control. Will modify as needed based on labwork results.       Relevant Orders   Lipid panel   CMP14+EGFR   TSH   CBC with Differential/Platelet   BMI 37.0-37.9, adult    Continue current meds.  Will adjust as needed based on results.  The patient is asked to make an attempt to improve diet and exercise patterns to aid in medical management of this problem. Addressed importance of increasing and maintaining water intake.       Relevant Orders   CMP14+EGFR   TSH   CBC with Differential/Platelet   Essential (hemorrhagic) thrombocythemia (HCC) - Primary    Patient stable.  Well controlled with current therapy.   Continue current meds.       Relevant Orders   CMP14+EGFR   TSH   CBC with Differential/Platelet   Other Visit Diagnoses     Vitamin D deficiency, unspecified        Checking labs today.  Will continue supplements as needed.   Relevant Orders   VITAMIN D 25 Hydroxy (Vit-D Deficiency, Fractures)   CMP14+EGFR   TSH   CBC with Differential/Platelet   B12 deficiency due to diet  Checking labs today.  Will continue supplements as needed.   Relevant Orders   CMP14+EGFR   TSH   Vitamin B12   CBC with Differential/Platelet   Hypothyroidism (acquired)       Relevant Orders   CMP14+EGFR   TSH   CBC with Differential/Platelet   Prediabetes       Checking labs today Patient counseled on dietary choices and verbalized understanding.   Relevant Orders   CMP14+EGFR   TSH   Hemoglobin A1c   CBC with Differential/Platelet       Return in about 1 month (around 05/18/2023) for F/U.   Total time spent: 20 minutes  Miki Kins, FNP  04/17/2023   This document may have been prepared by [facility] Voice Recognition software and as such may include unintentional dictation errors.

## 2023-04-17 NOTE — Progress Notes (Signed)
   Chief Complaint  Patient presents with   Foot Pain    "They're hurting.  I been up on them more.  They have been throbbing."    Subjective: 62 y.o. female presenting today for follow-up evaluation of plantar fasciitis bilateral.  She says that since she was evaluated last visit she has been very active on her feet this summer and she has had steadily increasing pain and tenderness to the bilateral heels and plantar fasciitis.  She completed the Medrol Dosepak and meloxicam prescription as well.  Requesting cortisone injections today   Past Medical History:  Diagnosis Date   Anginal pain (HCC)    Arthritis    BRCA negative 10/2021   MyRisk neg   Breast cancer (HCC)    Carpal tunnel syndrome of right wrist    Coronary atherosclerosis of unspecified type of vessel, native or graft    Hand and foot pain    HER2-positive carcinoma of breast (HCC)    Hyperlipidemia    Hypertension    Melanoma (HCC) 2020   MVP (mitral valve prolapse)    Personal history of chemotherapy    Personal history of radiation therapy    Sleep apnea    Vertigo      Objective: Physical Exam General: The patient is alert and oriented x3 in no acute distress.  Dermatology: Skin is warm, dry and supple bilateral lower extremities. Negative for open lesions or macerations bilateral.   Vascular: Dorsalis Pedis and Posterior Tibial pulses palpable bilateral.  Capillary fill time is immediate to all digits.  Neurological: Grossly intact via light touch  Musculoskeletal: Tenderness to palpation to the plantar aspect of the bilateral heels along the plantar fascia. All other joints range of motion within normal limits bilateral. Strength 5/5 in all groups bilateral.   Radiographic exam B/L feet 01/09/2023: Normal osseous mineralization.  Degenerative changes noted to the second TMT of the bilateral feet left greater than the right consistent with osteoarthritis.  No fractures identified.  Assessment: 1.  plantar fasciitis bilateral feet 2.  Intermittent occasional ankle pain right; currently asymptomatic  Plan of Care:  -Patient evaluated.  -Injection of 0.5 cc Celestone Soluspan injected into the bilateral plantar fascia 3. Rx for Medrol Dose Pak placed -Prescription for meloxicam 15 mg daily #60 with 1 refill -Continue wearing good supportive shoes and sneakers.  Advised against going barefoot -Return to clinic as needed  Felecia Shelling, DPM [facility]  Dr. Felecia Shelling, DPM    [address] [place], [place] [address]                Office [phone]  Fax [phone]

## 2023-04-18 ENCOUNTER — Other Ambulatory Visit: Payer: Self-pay | Admitting: Podiatry

## 2023-04-18 DIAGNOSIS — M79671 Pain in right foot: Secondary | ICD-10-CM

## 2023-04-18 DIAGNOSIS — M722 Plantar fascial fibromatosis: Secondary | ICD-10-CM

## 2023-04-30 ENCOUNTER — Encounter: Payer: Self-pay | Admitting: Family

## 2023-04-30 NOTE — Assessment & Plan Note (Signed)
Checking labs today.  Continue current therapy for lipid control. Will modify as needed based on labwork results.  

## 2023-04-30 NOTE — Assessment & Plan Note (Signed)
Continue current meds.  Will adjust as needed based on results.  The patient is asked to make an attempt to improve diet and exercise patterns to aid in medical management of this problem. Addressed importance of increasing and maintaining water intake.   

## 2023-04-30 NOTE — Assessment & Plan Note (Signed)
Patient stable.  Well controlled with current therapy.   Continue current meds.  

## 2023-05-01 DIAGNOSIS — Z09 Encounter for follow-up examination after completed treatment for conditions other than malignant neoplasm: Secondary | ICD-10-CM | POA: Diagnosis not present

## 2023-05-01 DIAGNOSIS — Z86010 Personal history of colonic polyps: Secondary | ICD-10-CM | POA: Diagnosis not present

## 2023-05-06 ENCOUNTER — Other Ambulatory Visit: Payer: Self-pay | Admitting: Family

## 2023-05-09 MED ORDER — PHENDIMETRAZINE TARTRATE ER 105 MG PO CP24: 1.0000 | ORAL_CAPSULE | Freq: Every day | ORAL | 0 refills | Status: DC

## 2023-06-19 ENCOUNTER — Other Ambulatory Visit: Payer: Self-pay | Admitting: Oncology

## 2023-06-25 ENCOUNTER — Other Ambulatory Visit: Payer: Self-pay | Admitting: Family

## 2023-06-26 ENCOUNTER — Ambulatory Visit (INDEPENDENT_AMBULATORY_CARE_PROVIDER_SITE_OTHER): Payer: 59

## 2023-06-26 ENCOUNTER — Encounter: Payer: Self-pay | Admitting: Podiatry

## 2023-06-26 ENCOUNTER — Ambulatory Visit: Payer: 59 | Admitting: Podiatry

## 2023-06-26 VITALS — Ht 66.0 in | Wt 230.0 lb

## 2023-06-26 DIAGNOSIS — M722 Plantar fascial fibromatosis: Secondary | ICD-10-CM

## 2023-06-26 MED ORDER — MELOXICAM 15 MG PO TABS: 15.0000 mg | ORAL_TABLET | Freq: Every day | ORAL | 2 refills | Status: DC

## 2023-06-26 MED ORDER — PHENDIMETRAZINE TARTRATE ER 105 MG PO CP24: 1.0000 | ORAL_CAPSULE | Freq: Every day | ORAL | 0 refills | Status: DC

## 2023-06-26 NOTE — Patient Instructions (Signed)
OOFOS slides or sandals  Vionics womens shoes and sandals

## 2023-06-26 NOTE — Progress Notes (Signed)
   Chief Complaint  Patient presents with   Foot Pain    Patient is here for bilateral foot pain , chronic PF, went away for while with injections and now they are hurting badly from the ankle down, changed diet, uses night splints, not helping anymore    Subjective: 62 y.o. female presenting today for follow-up evaluation of plantar fasciitis bilateral.  Today the patient would like to discuss long-term conservative treatment management for the chronic plantar fasciitis to the feet.  She continues to have mild intermittent pain especially depending on her activity   Past Medical History:  Diagnosis Date   Anginal pain (HCC)    Arthritis    BRCA negative 10/2021   MyRisk neg   Breast cancer (HCC)    Carpal tunnel syndrome of right wrist    Coronary atherosclerosis of unspecified type of vessel, native or graft    Hand and foot pain    HER2-positive carcinoma of breast (HCC)    Hyperlipidemia    Hypertension    Melanoma (HCC) 2020   MVP (mitral valve prolapse)    Personal history of chemotherapy    Personal history of radiation therapy    Sleep apnea    Vertigo      Objective: Physical Exam General: The patient is alert and oriented x3 in no acute distress.  Dermatology: Skin is warm, dry and supple bilateral lower extremities. Negative for open lesions or macerations bilateral.   Vascular: Dorsalis Pedis and Posterior Tibial pulses palpable bilateral.  Capillary fill time is immediate to all digits.  Neurological: Grossly intact via light touch  Musculoskeletal: There continues to be some tenderness to palpation to the plantar aspect of the bilateral heels along the plantar fascia. All other joints range of motion within normal limits bilateral. Strength 5/5 in all groups bilateral.   Radiographic exam B/L feet 01/09/2023: Normal osseous mineralization.  Degenerative changes noted to the second TMT of the bilateral feet left greater than the right consistent with  osteoarthritis.  No fractures identified.  Assessment: 1.  Chronic intermittent plantar fasciitis bilateral feet   Plan of Care:  -Patient evaluated.  -Today patient wanted to discuss more conservative long-term management and treatment for the plantar fasciitis. -Recommend daily stretching exercises specifically calf stretches which were demonstrated today -Patient does not go barefoot around the house.  She generally tries to wear good supportive shoes and sandals.  Continue.  Recommended OOFOS and Vionics shoes and sandals -OTC arch supports were provided for the patient today to support the foot -Return to clinic as needed  Felecia Shelling, DPM [facility]  Dr. Felecia Shelling, DPM    [address] [place], [place] [address]                Office [phone]  Fax [phone]

## 2023-07-03 ENCOUNTER — Encounter: Payer: Self-pay | Admitting: Family

## 2023-07-03 ENCOUNTER — Ambulatory Visit (INDEPENDENT_AMBULATORY_CARE_PROVIDER_SITE_OTHER): Payer: 59 | Admitting: Family

## 2023-07-03 VITALS — BP 128/86 | HR 82 | Ht 66.0 in | Wt 223.4 lb

## 2023-07-03 DIAGNOSIS — I10 Essential (primary) hypertension: Secondary | ICD-10-CM | POA: Diagnosis not present

## 2023-07-03 MED ORDER — AMLODIPINE BESY-BENAZEPRIL HCL 10-20 MG PO CAPS: 1.0000 | ORAL_CAPSULE | Freq: Every day | ORAL | 1 refills | Status: DC

## 2023-07-12 ENCOUNTER — Inpatient Hospital Stay: Payer: 59 | Admitting: Oncology

## 2023-07-17 ENCOUNTER — Inpatient Hospital Stay: Payer: 59 | Attending: Oncology | Admitting: Oncology

## 2023-07-17 DIAGNOSIS — Z170 Estrogen receptor positive status [ER+]: Secondary | ICD-10-CM

## 2023-07-17 DIAGNOSIS — C50211 Malignant neoplasm of upper-inner quadrant of right female breast: Secondary | ICD-10-CM

## 2023-07-17 NOTE — Progress Notes (Signed)
[facility]  [phone] [phone]  ID: Erin Church OB: 10/07/1960  MR#: [id number]  AOZ#:[id number]  Patient Care Team: Miki Kins, FNP as PCP - General (Family Medicine) Jim Like, RN as Registered Nurse Jeralyn Ruths, MD as Consulting Physician (Oncology) Ancil Linsey, MD (Inactive) as Consulting Physician (General Surgery) Carmina Miller, MD as Referring Physician (Radiation Oncology)  I connected with Erin Church on 07/17/23 at 10:45 AM EST by video enabled telemedicine visit and verified that I am speaking with the correct person using two identifiers.   I discussed the limitations, risks, security and privacy concerns of performing an evaluation and management service by telemedicine and the availability of in-person appointments. I also discussed with the patient that there may be a patient responsible charge related to this service. The patient expressed understanding and agreed to proceed.   Other persons participating in the visit and their role in the encounter: Patient, MD.  Patient's location: Home. Provider's location: Clinic.  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  INTERVAL HISTORY: Patient agreed to video assisted telemedicine visit for routine 20-month evaluation.  She continues to feel well and remains asymptomatic.  She continues to tolerate anastrozole without significant side effects.  She remains active taking care of her 2 grandchildren. She has no neurologic complaints.  She denies any recent fevers or illnesses.  She has a good appetite and denies weight loss.  She denies any chest pain, shortness of breath, cough, or hemoptysis.  She denies any nausea, vomiting, constipation, or diarrhea.  She has no urinary complaints.  Patient offers no further specific complaints today.  Patient offers no  specific complaints today.  REVIEW OF SYSTEMS:   Review of Systems  Constitutional: Negative.  Negative for fever, malaise/fatigue and weight loss.  Respiratory: Negative.  Negative for cough, hemoptysis and shortness of breath.   Cardiovascular: Negative.  Negative for chest pain and leg swelling.  Gastrointestinal: Negative.  Negative for abdominal pain, blood in stool and melena.  Genitourinary: Negative.  Negative for dysuria.  Musculoskeletal: Negative.  Negative for back pain.  Skin: Negative.  Negative for rash.  Neurological: Negative.  Negative for seizures, weakness and headaches.  Psychiatric/Behavioral: Negative.  The patient is not nervous/anxious.     As per HPI. Otherwise, a complete review of systems is negative.  PAST MEDICAL HISTORY: Past Medical History:  Diagnosis Date   Anginal pain (HCC)    Arthritis    BRCA negative 10/2021   MyRisk neg   Breast cancer (HCC)    Carpal tunnel syndrome of right wrist    Coronary atherosclerosis of unspecified type of vessel, native or graft    Hand and foot pain    HER2-positive carcinoma of breast (HCC)    Hyperlipidemia    Hypertension    Melanoma (HCC) 2020   MVP (mitral valve prolapse)    Personal history of chemotherapy    Personal history of radiation therapy    Sleep apnea    Vertigo     PAST SURGICAL HISTORY: Past Surgical History:  Procedure Laterality Date   BREAST BIOPSY Right 08/27/2018   [place] bx, [facility]   BREAST LUMPECTOMY Right 09/11/2018   BREAST LUMPECTOMY WITH NEEDLE LOCALIZATION AND AXILLARY SENTINEL LYMPH NODE BX Right 09/11/2018   Procedure: BREAST LUMPECTOMY WITH NEEDLE LOCALIZATION AND SENTINEL LYMPH NODE BX;  Surgeon: Ancil Linsey, MD;  Location: [facility]  ORS;  Service: General;  Laterality: Right;   CARDIAC CATHETERIZATION     CATARACT EXTRACTION W/PHACO Left 01/01/2017   Procedure: CATARACT EXTRACTION PHACO AND INTRAOCULAR LENS PLACEMENT (IOC)left Restor lens;  Surgeon: Lockie Mola, MD;   Location: [facility];  Service: Ophthalmology;  Laterality: Left;  Restor lens   CATARACT EXTRACTION W/PHACO Right 01/31/2017   Procedure: CATARACT EXTRACTION PHACO AND INTRAOCULAR LENS PLACEMENT (IOC);  Surgeon: Lockie Mola, MD;  Location: [facility];  Service: Ophthalmology;  Laterality: Right;   CESAREAN SECTION     CHOLECYSTECTOMY     HYSTEROSCOPY  06/2010   D&C   PORTACATH PLACEMENT Right 09/11/2018   Procedure: INSERTION PORT-A-CATH;  Surgeon: Ancil Linsey, MD;  Location: [facility] ORS;  Service: General;  Laterality: Right;   TEE WITHOUT CARDIOVERSION Right 11/02/2020   Procedure: TRANSESOPHAGEAL ECHOCARDIOGRAM (TEE);  Surgeon: Laurier Nancy, MD;  Location: [facility] ORS;  Service: Cardiovascular;  Laterality: Right;   TUBAL LIGATION      FAMILY HISTORY: Family History  Problem Relation Age of Onset   Hypertension Mother    Cancer Mother 34       LUNG   Hypertension Father    Colon cancer Paternal Uncle    Breast cancer Neg Hx     ADVANCED DIRECTIVES (Y/N):  N  HEALTH MAINTENANCE: Social History   Tobacco Use   Smoking status: Never   Smokeless tobacco: Never  Vaping Use   Vaping status: Never Used  Substance Use Topics   Alcohol use: Yes    Alcohol/week: 3.0 standard drinks of alcohol    Types: 3 Shots of liquor per week    Comment: social drinker   Drug use: No     Colonoscopy:  PAP:  Bone density:  Lipid panel:  No Known Allergies  Current Outpatient Medications  Medication Sig Dispense Refill   amLODipine-benazepril (LOTREL) 10-20 MG capsule Take 1 capsule by mouth daily. 90 capsule 1   anastrozole (ARIMIDEX) 1 MG tablet TAKE 1 TABLET BY MOUTH ONCE DAILY 90 tablet 0   fluticasone (FLONASE) 50 MCG/ACT nasal spray Place 2 sprays into both nostrils daily as needed. 16 g 3   linaclotide (LINZESS) 290 MCG CAPS capsule Take 1 capsule (290 mcg total) by mouth daily before breakfast. 30 capsule 3   meloxicam (MOBIC) 15 MG tablet Take 1  tablet (15 mg total) by mouth daily. 90 tablet 2   Phendimetrazine Tartrate 105 MG CP24 Take 1 capsule (105 mg total) by mouth daily. 30 capsule 0   No current facility-administered medications for this visit.    OBJECTIVE: There were no vitals filed for this visit.    There is no height or weight on file to calculate BMI.    ECOG FS:0 - Asymptomatic  General: Well-developed, well-nourished, no acute distress. HEENT: Normocephalic. Neuro: Alert, answering all questions appropriately. Cranial nerves grossly intact. Psych: Normal affect.  LAB RESULTS:  Lab Results  Component Value Date   NA 133 (L) 06/20/2022   K 4.2 06/20/2022   CL 106 06/20/2022   CO2 20 (L) 06/20/2022   GLUCOSE 100 (H) 06/20/2022   BUN 14 06/20/2022   CREATININE 0.59 06/20/2022   CALCIUM 8.8 (L) 06/20/2022   PROT 8.1 06/20/2022   ALBUMIN 3.6 06/20/2022   AST 33 06/20/2022   ALT 25 06/20/2022   ALKPHOS 91 06/20/2022   BILITOT 0.5 06/20/2022   GFRNONAA >60 06/20/2022   GFRAA >60 04/07/2020    Lab Results  Component Value Date  WBC 7.5 06/20/2022   NEUTROABS 3.6 06/20/2022   HGB 13.4 06/20/2022   HCT 40.5 06/20/2022   MCV 88.2 06/20/2022   PLT 388 06/20/2022     STUDIES: DG Foot Complete Left  Result Date: 06/26/2023 Please see detailed radiograph report in office note.  DG Foot Complete Right  Result Date: 06/26/2023 Please see detailed radiograph report in office note.   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  She completed 12 weekly cycles of Herceptin plus Taxol on December 26, 2018 and then completed her year-long maintenance Kanjinti on December 24, 2019.  She also completed adjuvant XRT.  Continue  anastrozole for a total of 5 years completing treatment in July 2025.  Her most recent mammogram on November 21, 2022 was reported as BI-RADS 1.  Repeat mammogram in March 2025.  Patient will have video-assisted telemedicine visit in July 2025 at which point she likely can be transitioned to yearly evaluation. Osteopenia: Bone mineral densities are monitored by patient's PCP.  Continue calcium and vitamin D supplementation.    I provided 20 minutes of face-to-face video visit time during this encounter which included chart review, counseling, and coordination of care as documented above.   Patient expressed understanding and was in agreement with this plan. She also understands that She can call clinic at any time with any questions, concerns, or complaints.    Cancer Staging  Malignant neoplasm of upper-inner quadrant of right breast in female, estrogen receptor positive (HCC) Staging form: Breast, AJCC 8th Edition - Clinical stage from 09/06/2018: Stage IA (cT1c, cN0, cM0, G3, ER+, PR+, HER2+) - Signed by Jeralyn Ruths, MD on 09/06/2018 Histologic grading system: 3 grade system Laterality: Right   Jeralyn Ruths, MD   07/17/2023 10:59 AM

## 2023-08-29 ENCOUNTER — Encounter: Payer: Self-pay | Admitting: Family

## 2023-08-29 NOTE — Assessment & Plan Note (Addendum)
 Increasing blood pressure meds, new dose sent.  Patient to continue taking blood pressure at home.   Reassess at follow up appointment.

## 2023-10-03 ENCOUNTER — Encounter: Payer: Self-pay | Admitting: Family

## 2023-10-03 ENCOUNTER — Ambulatory Visit (INDEPENDENT_AMBULATORY_CARE_PROVIDER_SITE_OTHER): Payer: 59 | Admitting: Family

## 2023-10-03 VITALS — BP 110/74 | HR 75 | Ht 66.0 in | Wt 224.0 lb

## 2023-10-03 DIAGNOSIS — F4321 Adjustment disorder with depressed mood: Secondary | ICD-10-CM

## 2023-10-03 DIAGNOSIS — Z0130 Encounter for examination of blood pressure without abnormal findings: Secondary | ICD-10-CM

## 2023-10-03 DIAGNOSIS — R6889 Other general symptoms and signs: Secondary | ICD-10-CM

## 2023-10-03 LAB — POCT XPERT XPRESS SARS COVID-2/FLU/RSV
FLU A: POSITIVE
FLU B: NEGATIVE
RSV RNA, PCR: NEGATIVE
SARS Coronavirus 2: NEGATIVE

## 2023-10-03 MED ORDER — LORAZEPAM 0.5 MG PO TABS: 0.5000 mg | ORAL_TABLET | Freq: Three times a day (TID) | ORAL | 0 refills | Status: DC

## 2023-10-03 MED ORDER — BENZONATATE 100 MG PO CAPS: 100.0000 mg | ORAL_CAPSULE | Freq: Three times a day (TID) | ORAL | 0 refills | Status: DC | PRN

## 2023-10-03 NOTE — Progress Notes (Signed)
 Acute Office Visit  Subjective:     Patient ID: Erin Church, female    DOB: [birth year], 63 y.o.   MRN: [id number]  Patient is in today for  Chief Complaint  Patient presents with   Cough   Nasal Congestion    Cough This is a new problem. The current episode started in the past 7 days. The problem occurs constantly. The cough is Productive of purulent sputum. Associated symptoms include chills, headaches, nasal congestion, postnasal drip, rhinorrhea and shortness of breath. The symptoms are aggravated by exercise. She has tried OTC cough suppressant, rest, cool air and body position changes for the symptoms. The treatment provided no relief.     Review of Systems  Constitutional:  Positive for chills.  HENT:  Positive for postnasal drip and rhinorrhea.   Respiratory:  Positive for cough and shortness of breath.   Neurological:  Positive for headaches.  All other systems reviewed and are negative.  Anxiety- Patient is having anxiety due to the death of her sister. She has had medications previously for this, asks if she can get a refill for the current issue.     Objective:    BP 110/74   Pulse 75   Ht 5' 6 (1.676 m)   Wt 224 lb (101.6 kg)   SpO2 96%   BMI 36.15 kg/m   Physical Exam Vitals and nursing note reviewed.  Constitutional:      Appearance: Normal appearance. She is normal weight.  HENT:     Head: Normocephalic.  Eyes:     Extraocular Movements: Extraocular movements intact.     Conjunctiva/sclera: Conjunctivae normal.     Pupils: Pupils are equal, round, and reactive to light.  Cardiovascular:     Rate and Rhythm: Normal rate.  Pulmonary:     Effort: Pulmonary effort is normal.     Breath sounds: Normal breath sounds.  Neurological:     General: No focal deficit present.     Mental Status: She is alert and oriented to person, place, and time. Mental status is at baseline.  Psychiatric:        Attention and Perception: Attention and perception  normal.        Mood and Affect: Mood is anxious. Affect is tearful.        Speech: Speech normal.        Behavior: Behavior normal. Behavior is cooperative.        Thought Content: Thought content normal.        Cognition and Memory: Cognition and memory normal.        Judgment: Judgment normal.     Results for orders placed or performed in visit on 10/03/23  POCT XPERT XPRESS SARS COVID-2/FLU/RSV  Result Value Ref Range   SARS Coronavirus 2 neg    FLU A pos    FLU B neg    RSV RNA, PCR neg     Recent Results (from the past 2160 hours)  POCT XPERT XPRESS SARS COVID-2/FLU/RSV     Status: None   Collection Time: 10/03/23  1:26 PM  Result Value Ref Range   SARS Coronavirus 2 neg    FLU A pos    FLU B neg    RSV RNA, PCR neg     Allergies as of 10/03/2023   No Known Allergies      Medication List        Accurate as of October 03, 2023  7:02 PM. If you have  any questions, ask your nurse or doctor.          amLODipine -benazepril  10-20 MG capsule Commonly known as: LOTREL  Take 1 capsule by mouth daily.   anastrozole  1 MG tablet Commonly known as: ARIMIDEX  TAKE 1 TABLET BY MOUTH ONCE DAILY   benzonatate  100 MG capsule Commonly known as: Tessalon  Perles Take 1 capsule (100 mg total) by mouth 3 (three) times daily as needed for cough. Started by: Kathren Scearce M Makaylynn Bonillas   fluticasone  50 MCG/ACT nasal spray Commonly known as: FLONASE  Place 2 sprays into both nostrils daily as needed.   linaclotide  290 MCG Caps capsule Commonly known as: Linzess  Take 1 capsule (290 mcg total) by mouth daily before breakfast.   LORazepam  0.5 MG tablet Commonly known as: ATIVAN  Take 1 tablet (0.5 mg total) by mouth every 8 (eight) hours. Started by: ALAN CHRISTELLA ARRANT   meloxicam  15 MG tablet Commonly known as: MOBIC  Take 1 tablet (15 mg total) by mouth daily.   Phendimetrazine  Tartrate 105 MG Cp24 Take 1 capsule (105 mg total) by mouth daily.            Assessment & Plan:    Problem List Items Addressed This Visit   None Visit Diagnoses       Flu-like symptoms    -  Primary   Relevant Orders   POCT XPERT XPRESS SARS COVID-2/FLU/RSV (Completed)     Grieving            Return in about 1 month (around 10/31/2023) for F/U.  Total time spent: 20 minutes  ALAN CHRISTELLA ARRANT, FNP  10/03/2023   This document may have been prepared by [facility] Voice Recognition software and as such may include unintentional dictation errors.

## 2023-10-04 ENCOUNTER — Other Ambulatory Visit: Payer: Self-pay | Admitting: Oncology

## 2023-10-07 ENCOUNTER — Ambulatory Visit
Admission: EM | Admit: 2023-10-07 | Discharge: 2023-10-07 | Disposition: A | Discharge status: Home / Self Care | Payer: 59 | Attending: Emergency Medicine | Admitting: Emergency Medicine

## 2023-10-07 DIAGNOSIS — M5441 Lumbago with sciatica, right side: Secondary | ICD-10-CM

## 2023-10-07 MED ORDER — PREDNISONE 10 MG (21) PO TBPK: ORAL_TABLET | Freq: Every day | ORAL | 0 refills | Status: DC

## 2023-10-07 NOTE — ED Triage Notes (Addendum)
 Patient to Urgent Care with complaints of right lower back pain that radiates into her glute/ right leg. Describes a hot/ nerve pain.  Symptoms started yesterday. Denies any injury- reports hx of pinched nerve but no hx of sciatica.   Meds: aleve/ ibuprofen/ tylenol . Little relief.

## 2023-10-07 NOTE — ED Provider Notes (Signed)
 [name]    CSN: [id number] Arrival date & time: 10/07/23  0831      History   Chief Complaint Chief Complaint  Patient presents with   Back Pain    HPI Erin Church is a 63 y.o. female.  Patient presents with right lower back pain that radiates to her right hip x 1 day.  No falls or injury.  The pain is worse with movement and improves with rest.  It waxes and wanes; currently 6/10.  Treatment attempted with Tylenol ; last taken during the night.  She denies abdominal pain, dysuria, hematuria, flank pain, numbness, weakness, saddle anesthesia, loss of bowel/bladder control, fever.  Patient was seen by her PCP on 10/03/2023 for flu-like symptoms and grieving; treated with Tessalon  Perles and lorazepam .   The history is provided by the patient and medical records.    Past Medical History:  Diagnosis Date   Anginal pain (HCC)    Arthritis    BRCA negative 10/2021   MyRisk neg   Breast cancer (HCC)    Carpal tunnel syndrome of right wrist    Coronary atherosclerosis of unspecified type of vessel, native or graft    Hand and foot pain    HER2-positive carcinoma of breast (HCC)    Hyperlipidemia    Hypertension    Melanoma (HCC) 2020   MVP (mitral valve prolapse)    Personal history of chemotherapy    Personal history of radiation therapy    Sleep apnea    Vertigo     Patient Active Problem List   Diagnosis Date Noted   Essential (hemorrhagic) thrombocythemia (HCC) 01/17/2023   Cervical high risk human papillomavirus (HPV) DNA test positive 11/20/2022   BMI 37.0-37.9, adult 10/27/2022   Generalized anxiety disorder 10/27/2022   LGSIL on Pap smear of cervix 11/14/2021   Malignant neoplasm of upper-inner quadrant of right breast in female, estrogen receptor positive (HCC) 09/05/2018   Menopause 10/23/2017   MVP (mitral valve prolapse) 02/13/2014   Coronary artery disease 02/03/2014   Essential hypertension, benign 02/03/2014   Hyperlipidemia 02/03/2014    Myalgia 02/03/2014   Cardiac function test normal 02/03/2014   Normal cardiac stress test 02/03/2014    Past Surgical History:  Procedure Laterality Date   BREAST BIOPSY Right 08/27/2018   us  bx, [facility]   BREAST LUMPECTOMY Right 09/11/2018   BREAST LUMPECTOMY WITH NEEDLE LOCALIZATION AND AXILLARY SENTINEL LYMPH NODE BX Right 09/11/2018   Procedure: BREAST LUMPECTOMY WITH NEEDLE LOCALIZATION AND SENTINEL LYMPH NODE BX;  Surgeon: Nicholaus Selinda Birmingham, MD;  Location: [facility];  Service: General;  Laterality: Right;   CARDIAC CATHETERIZATION     CATARACT EXTRACTION W/PHACO Left 01/01/2017   Procedure: CATARACT EXTRACTION PHACO AND INTRAOCULAR LENS PLACEMENT (IOC)left Restor lens;  Surgeon: Mittie Gaskin, MD;  Location: [facility];  Service: Ophthalmology;  Laterality: Left;  Restor lens   CATARACT EXTRACTION W/PHACO Right 01/31/2017   Procedure: CATARACT EXTRACTION PHACO AND INTRAOCULAR LENS PLACEMENT (IOC);  Surgeon: Mittie Gaskin, MD;  Location: [facility];  Service: Ophthalmology;  Laterality: Right;   CESAREAN SECTION     CHOLECYSTECTOMY     HYSTEROSCOPY  06/2010   D&C   PORTACATH PLACEMENT Right 09/11/2018   Procedure: INSERTION PORT-A-CATH;  Surgeon: Nicholaus Selinda Birmingham, MD;  Location: [facility];  Service: General;  Laterality: Right;   TEE WITHOUT CARDIOVERSION Right 11/02/2020   Procedure: TRANSESOPHAGEAL ECHOCARDIOGRAM (TEE);  Surgeon: Fernand Denyse LABOR, MD;  Location: [facility];  Service: Cardiovascular;  Laterality: Right;   TUBAL LIGATION      OB History     Gravida  3   Para  2   Term  2   Preterm      AB  1   Living  3      SAB  1   IAB      Ectopic      Multiple  1   Live Births  3            Home Medications    Prior to Admission medications   Medication Sig Start Date End Date Taking? Authorizing Provider  predniSONE  (STERAPRED UNI-PAK 21 TAB) 10 MG (21) TBPK tablet Take by mouth daily. As directed 10/07/23  Yes Corlis Burnard DEL,  NP  amLODipine -benazepril  (LOTREL ) 10-20 MG capsule Take 1 capsule by mouth daily. 07/03/23   Orlean Alan HERO, FNP  anastrozole  (ARIMIDEX ) 1 MG tablet TAKE 1 TABLET BY MOUTH ONCE DAILY 10/04/23   Jacobo Evalene PARAS, MD  benzonatate  (TESSALON  PERLES) 100 MG capsule Take 1 capsule (100 mg total) by mouth 3 (three) times daily as needed for cough. 10/03/23   Orlean Alan HERO, FNP  fluticasone  (FLONASE ) 50 MCG/ACT nasal spray Place 2 sprays into both nostrils daily as needed. Patient not taking: Reported on 10/03/2023 10/27/22   Orlean Alan HERO, FNP  linaclotide  (LINZESS ) 290 MCG CAPS capsule Take 1 capsule (290 mcg total) by mouth daily before breakfast. 04/02/23   Orlean Alan HERO, FNP  LORazepam  (ATIVAN ) 0.5 MG tablet Take 1 tablet (0.5 mg total) by mouth every 8 (eight) hours. 10/03/23   Orlean Alan HERO, FNP  meloxicam  (MOBIC ) 15 MG tablet Take 1 tablet (15 mg total) by mouth daily. Patient not taking: Reported on 10/03/2023 06/26/23 03/22/24  Janit Thresa HERO, DPM  Phendimetrazine  Tartrate 105 MG CP24 Take 1 capsule (105 mg total) by mouth daily. Patient not taking: Reported on 10/03/2023 06/26/23   Orlean Alan HERO, FNP    Family History Family History  Problem Relation Age of Onset   Hypertension Mother    Cancer Mother 32       LUNG   Hypertension Father    Colon cancer Paternal Uncle    Breast cancer Neg Hx     Social History Social History   Tobacco Use   Smoking status: Never   Smokeless tobacco: Never  Vaping Use   Vaping status: Never Used  Substance Use Topics   Alcohol use: Yes    Alcohol/week: 3.0 standard drinks of alcohol    Types: 3 Shots of liquor per week    Comment: social drinker   Drug use: No     Allergies   Patient has no known allergies.   Review of Systems Review of Systems  Constitutional:  Negative for chills and fever.  Gastrointestinal:  Negative for abdominal pain.  Genitourinary:  Negative for dysuria, flank pain and hematuria.  Musculoskeletal:   Positive for back pain. Negative for gait problem.  Skin:  Negative for color change, rash and wound.  Neurological:  Negative for weakness and numbness.     Physical Exam Triage Vital Signs ED Triage Vitals  Encounter Vitals Group     BP 10/07/23 0929 132/88     Systolic BP Percentile --      Diastolic BP Percentile --      Pulse Rate 10/07/23 0929 92     Resp 10/07/23 0929 18     Temp 10/07/23 0929 98.5 F (36.9 C)  Temp src --      SpO2 10/07/23 0929 95 %     Weight --      Height --      Head Circumference --      Peak Flow --      Pain Score 10/07/23 0933 6     Pain Loc --      Pain Education --      Exclude from Growth Chart --    No data found.  Updated Vital Signs BP 132/88   Pulse 92   Temp 98.5 F (36.9 C)   Resp 18   SpO2 95%   Visual Acuity Right Eye Distance:   Left Eye Distance:   Bilateral Distance:    Right Eye Near:   Left Eye Near:    Bilateral Near:     Physical Exam Constitutional:      General: She is not in acute distress. HENT:     Mouth/Throat:     Mouth: Mucous membranes are moist.  Cardiovascular:     Rate and Rhythm: Normal rate and regular rhythm.  Pulmonary:     Effort: Pulmonary effort is normal. No respiratory distress.  Abdominal:     General: Bowel sounds are normal.     Palpations: Abdomen is soft.     Tenderness: There is no abdominal tenderness. There is no right CVA tenderness, left CVA tenderness, guarding or rebound.  Musculoskeletal:        General: No swelling, tenderness or deformity. Normal range of motion.  Skin:    General: Skin is warm and dry.     Capillary Refill: Capillary refill takes less than 2 seconds.     Findings: No bruising, erythema, lesion or rash.  Neurological:     General: No focal deficit present.     Mental Status: She is alert and oriented to person, place, and time.     Sensory: No sensory deficit.     Motor: No weakness.     Gait: Gait normal.      UC Treatments / Results   Labs (all labs ordered are listed, but only abnormal results are displayed) Labs Reviewed - No data to display  EKG   Radiology No results found.  Procedures Procedures (including critical care time)  Medications Ordered in UC Medications - No data to display  Initial Impression / Assessment and Plan / UC Course  I have reviewed the triage vital signs and the nursing notes.  Pertinent labs & imaging results that were available during my care of the patient were reviewed by me and considered in my medical decision making (see chart for details).   Right lower back pain with right side sciatica.  Afebrile and vital signs are stable.  Exam is reassuring.  Treating today with prednisone  taper.  Education provided on sciatica.  Discussed gentle stretching and warm compresses.  Instructed patient to follow-up with her PCP if her symptoms are not improving.  ED precautions given.  She agrees to plan of care.   Final Clinical Impressions(s) / UC Diagnoses   Final diagnoses:  Acute right-sided low back pain with right-sided sciatica     Discharge Instructions      Take the prednisone  as directed.  Follow up with your primary care provider.        ED Prescriptions     Medication Sig Dispense Auth. Provider   predniSONE  (STERAPRED UNI-PAK 21 TAB) 10 MG (21) TBPK tablet Take by mouth daily. As directed 21  tablet Corlis Burnard DEL, NP      I have reviewed the PDMP during this encounter.   Corlis Burnard DEL, NP 10/07/23 1010

## 2023-10-07 NOTE — Discharge Instructions (Addendum)
 Take the prednisone as directed.  Follow up with your primary care provider.

## 2023-11-08 ENCOUNTER — Encounter: Payer: Self-pay | Admitting: *Deleted

## 2023-11-09 ENCOUNTER — Encounter: Payer: Self-pay | Admitting: Emergency Medicine

## 2023-11-14 ENCOUNTER — Encounter: Payer: Self-pay | Admitting: Family

## 2023-11-15 ENCOUNTER — Encounter: Payer: Self-pay | Admitting: *Deleted

## 2023-11-15 ENCOUNTER — Other Ambulatory Visit: Payer: Self-pay

## 2023-11-15 MED ORDER — FLUTICASONE PROPIONATE 50 MCG/ACT NA SUSP: 2.0000 | Freq: Every day | NASAL | 5 refills | Status: AC | PRN

## 2023-11-16 ENCOUNTER — Ambulatory Visit
Admission: RE | Admit: 2023-11-16 | Discharge: 2023-11-16 | Disposition: A | Discharge status: Home / Self Care | Payer: 59 | Attending: Gastroenterology | Admitting: Gastroenterology

## 2023-11-16 ENCOUNTER — Encounter: Payer: Self-pay | Admitting: *Deleted

## 2023-11-16 ENCOUNTER — Encounter
Admission: RE | Disposition: A | Discharge status: Home / Self Care | Payer: Self-pay | Source: Home / Self Care | Attending: Gastroenterology

## 2023-11-16 ENCOUNTER — Ambulatory Visit: Admission: RE | Admit: 2023-11-16 | Payer: 59 | Source: Home / Self Care

## 2023-11-16 ENCOUNTER — Ambulatory Visit: Admitting: Anesthesiology

## 2023-11-16 DIAGNOSIS — D123 Benign neoplasm of transverse colon: Secondary | ICD-10-CM | POA: Diagnosis not present

## 2023-11-16 DIAGNOSIS — Z9221 Personal history of antineoplastic chemotherapy: Secondary | ICD-10-CM | POA: Diagnosis not present

## 2023-11-16 DIAGNOSIS — G4730 Sleep apnea, unspecified: Secondary | ICD-10-CM | POA: Insufficient documentation

## 2023-11-16 DIAGNOSIS — E669 Obesity, unspecified: Secondary | ICD-10-CM | POA: Diagnosis not present

## 2023-11-16 DIAGNOSIS — K649 Unspecified hemorrhoids: Secondary | ICD-10-CM | POA: Diagnosis not present

## 2023-11-16 DIAGNOSIS — Z1211 Encounter for screening for malignant neoplasm of colon: Secondary | ICD-10-CM | POA: Diagnosis not present

## 2023-11-16 DIAGNOSIS — K640 First degree hemorrhoids: Secondary | ICD-10-CM | POA: Insufficient documentation

## 2023-11-16 DIAGNOSIS — K635 Polyp of colon: Secondary | ICD-10-CM | POA: Diagnosis not present

## 2023-11-16 DIAGNOSIS — I10 Essential (primary) hypertension: Secondary | ICD-10-CM | POA: Diagnosis not present

## 2023-11-16 DIAGNOSIS — I341 Nonrheumatic mitral (valve) prolapse: Secondary | ICD-10-CM | POA: Insufficient documentation

## 2023-11-16 DIAGNOSIS — I2510 Atherosclerotic heart disease of native coronary artery without angina pectoris: Secondary | ICD-10-CM | POA: Diagnosis not present

## 2023-11-16 DIAGNOSIS — Z860101 Personal history of adenomatous and serrated colon polyps: Secondary | ICD-10-CM | POA: Diagnosis not present

## 2023-11-16 DIAGNOSIS — Z923 Personal history of irradiation: Secondary | ICD-10-CM | POA: Diagnosis not present

## 2023-11-16 HISTORY — PX: COLONOSCOPY WITH PROPOFOL: SHX5780

## 2023-11-16 HISTORY — DX: Anxiety disorder, unspecified: F41.9

## 2023-11-16 HISTORY — PX: POLYPECTOMY: SHX5525

## 2023-11-16 HISTORY — DX: Essential (hemorrhagic) thrombocythemia: D47.3

## 2023-11-16 HISTORY — DX: Low grade squamous intraepithelial lesion on cytologic smear of cervix (LGSIL): R87.612

## 2023-11-16 SURGERY — COLONOSCOPY WITH PROPOFOL
Anesthesia: General

## 2023-11-16 MED ORDER — PROPOFOL 500 MG/50ML IV EMUL
INTRAVENOUS | Status: DC | PRN
  Administered 2023-11-16: 75 ug/kg/min via INTRAVENOUS

## 2023-11-16 MED ORDER — SODIUM CHLORIDE 0.9 % IV SOLN: INTRAVENOUS | Status: DC

## 2023-11-16 MED ORDER — PROPOFOL 10 MG/ML IV BOLUS
INTRAVENOUS | Status: DC | PRN
  Administered 2023-11-16: 20 mg via INTRAVENOUS
  Administered 2023-11-16: 40 mg via INTRAVENOUS
  Administered 2023-11-16: 10 mg via INTRAVENOUS
  Administered 2023-11-16: 50 mg via INTRAVENOUS

## 2023-11-16 MED ORDER — LIDOCAINE HCL (CARDIAC) PF 100 MG/5ML IV SOSY
PREFILLED_SYRINGE | INTRAVENOUS | Status: DC | PRN
Start: 2023-11-16 — End: 2023-11-16
  Administered 2023-11-16: 50 mg via INTRAVENOUS

## 2023-11-16 MED ORDER — DEXMEDETOMIDINE HCL IN NACL 80 MCG/20ML IV SOLN
INTRAVENOUS | Status: DC | PRN
  Administered 2023-11-16: 20 ug via INTRAVENOUS

## 2023-11-16 NOTE — Interval H&P Note (Signed)
 History and Physical Interval Note:  11/16/2023 10:36 AM  Erin Church  has presented today for surgery, with the diagnosis of hx of adenomatous polyp of colon.  The various methods of treatment have been discussed with the patient and family. After consideration of risks, benefits and other options for treatment, the patient has consented to  Procedure(s): COLONOSCOPY WITH PROPOFOL (N/A) as a surgical intervention.  The patient's history has been reviewed, patient examined, no change in status, stable for surgery.  I have reviewed the patient's chart and labs.  Questions were answered to the patient's satisfaction.     Regis Bill  Ok to proceed with colonoscopy

## 2023-11-16 NOTE — Anesthesia Postprocedure Evaluation (Signed)
 Anesthesia Post Note  Patient: Erin Church  Procedure(s) Performed: COLONOSCOPY WITH PROPOFOL POLYPECTOMY  Patient location during evaluation: PACU Anesthesia Type: General Level of consciousness: awake and awake and alert Pain management: satisfactory to patient Vital Signs Assessment: post-procedure vital signs reviewed and stable Respiratory status: spontaneous breathing Cardiovascular status: blood pressure returned to baseline Anesthetic complications: no   No notable events documented.   Last Vitals:  Vitals:   11/16/23 1019 11/16/23 1100  BP: (!) 156/106 (!) 119/55  Pulse: 97   Resp: 16   Temp: (!) 36.2 C (!) 36.1 C  SpO2: 98%     Last Pain:  Vitals:   11/16/23 1110  TempSrc:   PainSc: 0-No pain                 VAN STAVEREN,Sumire Halbleib

## 2023-11-16 NOTE — Transfer of Care (Signed)
 Immediate Anesthesia Transfer of Care Note  Patient: Erin Church  Procedure(s) Performed: COLONOSCOPY WITH PROPOFOL POLYPECTOMY  Patient Location: PACU  Anesthesia Type:General  Level of Consciousness: sedated  Airway & Oxygen Therapy: Patient Spontanous Breathing  Post-op Assessment: Report given to RN and Post -op Vital signs reviewed and stable  Post vital signs: Reviewed and stable  Last Vitals:  Vitals Value Taken Time  BP 119/55 11/16/23 1100  Temp    Pulse 72 11/16/23 1100  Resp 21 11/16/23 1100  SpO2 94 % 11/16/23 1100  Vitals shown include unfiled device data.  Last Pain:  Vitals:   11/16/23 1019  TempSrc: Temporal  PainSc: 0-No pain         Complications: No notable events documented.

## 2023-11-16 NOTE — Op Note (Signed)
 [facility] Patient Name: Erin Church Procedure Date: 11/16/2023 10:26 AM MRN: [id number] Account #: [id number] Date of Birth: [birth year] Admit Type: Outpatient Age: 63 Room: [facility] ENDO ROOM 3 Gender: Female Note Status: Finalized Instrument Name: Prentice Docker [id number] Procedure:             Colonoscopy Indications:           Surveillance: Personal history of adenomatous polyps                         on last colonoscopy > 5 years ago Providers:             Eather Colas MD, MD Medicines:             Monitored Anesthesia Care Complications:         No immediate complications. Estimated blood loss:                         Minimal. Procedure:             Pre-Anesthesia Assessment:                        - Prior to the procedure, a History and Physical was                         performed, and patient medications and allergies were                         reviewed. The patient is competent. The risks and                         benefits of the procedure and the sedation options and                         risks were discussed with the patient. All questions                         were answered and informed consent was obtained.                         Patient identification and proposed procedure were                         verified by the physician, the nurse, the                         anesthesiologist, the anesthetist and the technician                         in the endoscopy suite. Mental Status Examination:                         alert and oriented. Airway Examination: normal                         oropharyngeal airway and neck mobility. Respiratory                         Examination: clear to auscultation. CV Examination:  normal. Prophylactic Antibiotics: The patient does not                         require prophylactic antibiotics. Prior                         Anticoagulants: The patient has taken no  anticoagulant                         or antiplatelet agents. ASA Grade Assessment: III - A                         patient with severe systemic disease. After reviewing                         the risks and benefits, the patient was deemed in                         satisfactory condition to undergo the procedure. The                         anesthesia plan was to use monitored anesthesia care                         (MAC). Immediately prior to administration of                         medications, the patient was re-assessed for adequacy                         to receive sedatives. The heart rate, respiratory                         rate, oxygen saturations, blood pressure, adequacy of                         pulmonary ventilation, and response to care were                         monitored throughout the procedure. The physical                         status of the patient was re-assessed after the                         procedure.                        After obtaining informed consent, the colonoscope was                         passed under direct vision. Throughout the procedure,                         the patient's blood pressure, pulse, and oxygen                         saturations were monitored continuously. The  Colonoscope was introduced through the anus and                         advanced to the the terminal ileum, with                         identification of the appendiceal orifice and IC                         valve. The colonoscopy was performed without                         difficulty. The patient tolerated the procedure well.                         The quality of the bowel preparation was good. The                         terminal ileum, ileocecal valve, appendiceal orifice,                         and rectum were photographed. Findings:      The perianal and digital rectal examinations were normal.      The terminal ileum appeared  normal.      A 2 mm polyp was found in the transverse colon. The polyp was sessile.       The polyp was removed with a jumbo cold forceps. Resection and retrieval       were complete. Estimated blood loss was minimal.      Internal hemorrhoids were found during retroflexion. The hemorrhoids       were Grade I (internal hemorrhoids that do not prolapse).      The exam was otherwise without abnormality on direct and retroflexion       views. Impression:            - The examined portion of the ileum was normal.                        - One 2 mm polyp in the transverse colon, removed with                         a jumbo cold forceps. Resected and retrieved.                        - Internal hemorrhoids.                        - The examination was otherwise normal on direct and                         retroflexion views. Recommendation:        - Discharge patient to home.                        - Resume previous diet.                        - Continue present medications.                        -  Await pathology results.                        - Repeat colonoscopy in 5 years for surveillance.                        - Return to referring physician as previously                         scheduled. Procedure Code(s):     --- Professional ---                        [phone], Colonoscopy, flexible; with biopsy, single or                         multiple Diagnosis Code(s):     --- Professional ---                        Z86.010, Personal history of colonic polyps                        K64.0, First degree hemorrhoids                        D12.3, Benign neoplasm of transverse colon (hepatic                         flexure or splenic flexure) CPT copyright 2022 [employer]. All rights reserved. The codes documented in this report are preliminary and upon coder review may  be revised to meet current compliance requirements. Eather Colas MD, MD 11/16/2023 11:03:06 AM Number of  Addenda: 0 Note Initiated On: 11/16/2023 10:26 AM Scope Withdrawal Time: 0 hours 8 minutes 55 seconds  Total Procedure Duration: 0 hours 12 minutes 51 seconds  Estimated Blood Loss:  Estimated blood loss was minimal.      [facility]

## 2023-11-16 NOTE — Anesthesia Preprocedure Evaluation (Signed)
 Anesthesia Evaluation  Patient identified by MRN, date of birth, ID band Patient awake    Reviewed: Allergy & Precautions, NPO status , Patient's Chart, lab work & pertinent test results  Airway Mallampati: II  TM Distance: >3 FB Neck ROM: full    Dental  (+) Implants   Pulmonary neg pulmonary ROS, sleep apnea and Continuous Positive Airway Pressure Ventilation    Pulmonary exam normal breath sounds clear to auscultation       Cardiovascular Exercise Tolerance: Good hypertension, Pt. on medications + CAD and + DOE  negative cardio ROS Normal cardiovascular exam Rhythm:Regular Rate:Normal  Small membranous VSD   Neuro/Psych   Anxiety     negative neurological ROS  negative psych ROS   GI/Hepatic negative GI ROS, Neg liver ROS,,,  Endo/Other  negative endocrine ROS  Class 3 obesity  Renal/GU negative Renal ROS  negative genitourinary   Musculoskeletal   Abdominal  (+) + obese  Peds negative pediatric ROS (+)  Hematology negative hematology ROS (+)   Anesthesia Other Findings Past Medical History: No date: Anginal pain (HCC) No date: Anxiety No date: Arthritis 10/2021: BRCA negative     Comment:  MyRisk neg No date: Breast cancer (HCC) No date: Carpal tunnel syndrome of right wrist No date: Coronary atherosclerosis of unspecified type of vessel,  native or graft No date: Essential (hemorrhagic) thrombocythemia (HCC) No date: Hand and foot pain No date: HER2-positive carcinoma of breast (HCC) No date: Hyperlipidemia No date: Hypertension No date: LGSIL on Pap smear of cervix 2020: Melanoma (HCC) No date: MVP (mitral valve prolapse) No date: Personal history of chemotherapy No date: Personal history of radiation therapy No date: Sleep apnea No date: Vertigo  Past Surgical History: 08/27/2018: BREAST BIOPSY; Right     Comment:  [place] bx, [facility] 09/11/2018: BREAST LUMPECTOMY; Right 09/11/2018: BREAST  LUMPECTOMY WITH NEEDLE LOCALIZATION AND AXILLARY  SENTINEL LYMPH NODE BX; Right     Comment:  Procedure: BREAST LUMPECTOMY WITH NEEDLE LOCALIZATION               AND SENTINEL LYMPH NODE BX;  Surgeon: Ancil Linsey,               MD;  Location: [facility] OR[facility];  Service: General;  Laterality:               Right; No date: CARDIAC CATHETERIZATION 01/01/2017: CATARACT EXTRACTION W/PHACO; Left     Comment:  Procedure: CATARACT EXTRACTION PHACO AND INTRAOCULAR               LENS PLACEMENT (IOC)left Restor lens;  Surgeon:               Lockie Mola, MD;  Location: [facility];              Service: Ophthalmology;  Laterality: Left;  Restor lens 01/31/2017: CATARACT EXTRACTION W/PHACO; Right     Comment:  Procedure: CATARACT EXTRACTION PHACO AND INTRAOCULAR               LENS PLACEMENT (IOC);  Surgeon: Lockie Mola, MD;              Location: [facility];  Service: Ophthalmology;                Laterality: Right; No date: CESAREAN SECTION No date: CHOLECYSTECTOMY No date: EYE SURGERY 06/2010: HYSTEROSCOPY     Comment:  D&C 09/11/2018: PORTACATH PLACEMENT; Right     Comment:  Procedure: INSERTION PORT-A-CATH;  Surgeon: Earlene Plater,  Rosana Berger, MD;  Location: [facility];  Service: General;                Laterality: Right; 11/02/2020: TEE WITHOUT CARDIOVERSION; Right     Comment:  Procedure: TRANSESOPHAGEAL ECHOCARDIOGRAM (TEE);                Surgeon: Laurier Nancy, MD;  Location: [facility];                Service: Cardiovascular;  Laterality: Right; No date: TUBAL LIGATION  BMI    Body Mass Index: 36.15 kg/m      Reproductive/Obstetrics negative OB ROS                             Anesthesia Physical Anesthesia Plan  ASA: 3  Anesthesia Plan: General   Post-op Pain Management:    Induction: Intravenous  PONV Risk Score and Plan: Propofol infusion and TIVA  Airway Management Planned: Natural Airway and Nasal  Cannula  Additional Equipment:   Intra-op Plan:   Post-operative Plan:   Informed Consent: I have reviewed the patients History and Physical, chart, labs and discussed the procedure including the risks, benefits and alternatives for the proposed anesthesia with the patient or authorized representative who has indicated his/her understanding and acceptance.     Dental Advisory Given  Plan Discussed with: CRNA  Anesthesia Plan Comments:        Anesthesia Quick Evaluation

## 2023-11-16 NOTE — H&P (Signed)
 Outpatient short stay form Pre-procedure 11/16/2023  Erin Bill, MD  Primary Physician: Miki Kins, FNP  Reason for visit:  Surveillance  History of present illness:    63 y/o lady with history of obesity, hypertension, and constipation here for surveillance colonoscopy. Last colonoscopy in 2019 with 4 small Ta's. No blood thinners. No family history of colon cancer. History of cholecystectomy.    Current Facility-Administered Medications:    0.9 %  sodium chloride infusion, , Intravenous, Continuous, Neale Marzette, Rossie Muskrat, MD  Medications Prior to Admission  Medication Sig Dispense Refill Last Dose/Taking   amLODipine-benazepril (LOTREL) 10-20 MG capsule Take 1 capsule by mouth daily. 90 capsule 1 11/16/2023   anastrozole (ARIMIDEX) 1 MG tablet TAKE 1 TABLET BY MOUTH ONCE DAILY 90 tablet 0 11/15/2023 Morning   fluticasone (FLONASE) 50 MCG/ACT nasal spray Place 2 sprays into both nostrils daily as needed. 16 g 5 Past Week   linaclotide (LINZESS) 290 MCG CAPS capsule Take 1 capsule (290 mcg total) by mouth daily before breakfast. 30 capsule 3 Past Week   LORazepam (ATIVAN) 0.5 MG tablet Take 1 tablet (0.5 mg total) by mouth every 8 (eight) hours. 30 tablet 0 Past Week   benzonatate (TESSALON PERLES) 100 MG capsule Take 1 capsule (100 mg total) by mouth 3 (three) times daily as needed for cough. (Patient not taking: Reported on 11/16/2023) 30 capsule 0 Not Taking   meloxicam (MOBIC) 15 MG tablet Take 1 tablet (15 mg total) by mouth daily. (Patient not taking: Reported on 10/03/2023) 90 tablet 2    Phendimetrazine Tartrate 105 MG CP24 Take 1 capsule (105 mg total) by mouth daily. (Patient not taking: Reported on 10/03/2023) 30 capsule 0    predniSONE (STERAPRED UNI-PAK 21 TAB) 10 MG (21) TBPK tablet Take by mouth daily. As directed (Patient not taking: Reported on 11/16/2023) 21 tablet 0 Completed Course     No Known Allergies   Past Medical History:  Diagnosis Date   Anginal  pain (HCC)    Anxiety    Arthritis    BRCA negative 10/2021   MyRisk neg   Breast cancer (HCC)    Carpal tunnel syndrome of right wrist    Coronary atherosclerosis of unspecified type of vessel, native or graft    Essential (hemorrhagic) thrombocythemia (HCC)    Hand and foot pain    HER2-positive carcinoma of breast (HCC)    Hyperlipidemia    Hypertension    LGSIL on Pap smear of cervix    Melanoma (HCC) 2020   MVP (mitral valve prolapse)    Personal history of chemotherapy    Personal history of radiation therapy    Sleep apnea    Vertigo     Review of systems:  Otherwise negative.    Physical Exam  Gen: Alert, oriented. Appears stated age.  HEENT: PERRLA. Lungs: No respiratory distress CV: RRR Abd: soft, benign, no masses Ext: No edema    Planned procedures: Proceed with colonoscopy. The patient understands the nature of the planned procedure, indications, risks, alternatives and potential complications including but not limited to bleeding, infection, perforation, damage to internal organs and possible oversedation/side effects from anesthesia. The patient agrees and gives consent to proceed.  Please refer to procedure notes for findings, recommendations and patient disposition/instructions.     Erin Bill, MD [facility]

## 2023-11-19 ENCOUNTER — Other Ambulatory Visit: Payer: Self-pay

## 2023-11-19 LAB — SURGICAL PATHOLOGY

## 2023-11-19 MED ORDER — LORAZEPAM 0.5 MG PO TABS: 0.5000 mg | ORAL_TABLET | Freq: Three times a day (TID) | ORAL | 0 refills | Status: DC

## 2023-11-24 ENCOUNTER — Other Ambulatory Visit: Payer: Self-pay

## 2023-11-24 ENCOUNTER — Ambulatory Visit
Admission: RE | Admit: 2023-11-24 | Discharge: 2023-11-24 | Disposition: A | Discharge status: Home / Self Care | Source: Ambulatory Visit | Attending: Emergency Medicine | Admitting: Emergency Medicine

## 2023-11-24 VITALS — BP 136/92 | HR 93 | Temp 98.3°F | Resp 17

## 2023-11-24 DIAGNOSIS — J069 Acute upper respiratory infection, unspecified: Secondary | ICD-10-CM

## 2023-11-24 LAB — POC COVID19/FLU A&B COMBO
Covid Antigen, POC: NEGATIVE
Influenza A Antigen, POC: NEGATIVE
Influenza B Antigen, POC: NEGATIVE

## 2023-11-24 LAB — POCT RAPID STREP A (OFFICE): Rapid Strep A Screen: NEGATIVE

## 2023-11-24 NOTE — ED Triage Notes (Signed)
 Patient presents to [facility] for evauation of 48 hours of fever, severe fatigue, 100-101 temp at home with cough, runny nose and sore throat

## 2023-11-24 NOTE — Discharge Instructions (Addendum)
 Declined avs

## 2023-11-24 NOTE — ED Provider Notes (Signed)
 Erin Church    CSN: [id number] Arrival date & time: 11/24/23  8841      History   Chief Complaint Chief Complaint  Patient presents with   Fever    Throat is sore had temp Thursday been very tried! - Entered by patient   URI    HPI Erin Church is a 63 y.o. female.   Patient presents for evaluation of fever peaking at 101, nasal congestion, rhinorrhea, sore throat and a nonproductive cough beginning 1 day ago.  Has had small bouts of diarrhea, unsure if related to recent colonoscopy as she did not have a bowel movements for several days after.  No known sick contacts.  Has attempted use of Tylenol, antihistamines and a cold and flu tablet.  Endorses symptoms improved greatly from 1 day ago but still wanted to rule out covid/flu.   Past Medical History:  Diagnosis Date   Anginal pain (HCC)    Anxiety    Arthritis    BRCA negative 10/2021   MyRisk neg   Breast cancer (HCC)    Carpal tunnel syndrome of right wrist    Coronary atherosclerosis of unspecified type of vessel, native or graft    Essential (hemorrhagic) thrombocythemia (HCC)    Hand and foot pain    HER2-positive carcinoma of breast (HCC)    Hyperlipidemia    Hypertension    LGSIL on Pap smear of cervix    Melanoma (HCC) 2020   MVP (mitral valve prolapse)    Personal history of chemotherapy    Personal history of radiation therapy    Sleep apnea    Vertigo     Patient Active Problem List   Diagnosis Date Noted   Essential (hemorrhagic) thrombocythemia (HCC) 01/17/2023   Cervical high risk human papillomavirus (HPV) DNA test positive 11/20/2022   BMI 37.0-37.9, adult 10/27/2022   Generalized anxiety disorder 10/27/2022   LGSIL on Pap smear of cervix 11/14/2021   Malignant neoplasm of upper-inner quadrant of right breast in female, estrogen receptor positive (HCC) 09/05/2018   Menopause 10/23/2017   MVP (mitral valve prolapse) 02/13/2014   Coronary artery disease 02/03/2014   Essential  hypertension, benign 02/03/2014   Hyperlipidemia 02/03/2014   Myalgia 02/03/2014   Cardiac function test normal 02/03/2014   Normal cardiac stress test 02/03/2014    Past Surgical History:  Procedure Laterality Date   BREAST BIOPSY Right 08/27/2018   [place] bx, [facility]   BREAST LUMPECTOMY Right 09/11/2018   BREAST LUMPECTOMY WITH NEEDLE LOCALIZATION AND AXILLARY SENTINEL LYMPH NODE BX Right 09/11/2018   Procedure: BREAST LUMPECTOMY WITH NEEDLE LOCALIZATION AND SENTINEL LYMPH NODE BX;  Surgeon: Ancil Linsey, MD;  Location: [facility];  Service: General;  Laterality: Right;   CARDIAC CATHETERIZATION     CATARACT EXTRACTION W/PHACO Left 01/01/2017   Procedure: CATARACT EXTRACTION PHACO AND INTRAOCULAR LENS PLACEMENT (IOC)left Restor lens;  Surgeon: Lockie Mola, MD;  Location: [facility];  Service: Ophthalmology;  Laterality: Left;  Restor lens   CATARACT EXTRACTION W/PHACO Right 01/31/2017   Procedure: CATARACT EXTRACTION PHACO AND INTRAOCULAR LENS PLACEMENT (IOC);  Surgeon: Lockie Mola, MD;  Location: [facility];  Service: Ophthalmology;  Laterality: Right;   CESAREAN SECTION     CHOLECYSTECTOMY     COLONOSCOPY WITH PROPOFOL N/A 11/16/2023   Procedure: COLONOSCOPY WITH PROPOFOL;  Surgeon: Regis Bill, MD;  Location: [facility];  Service: Endoscopy;  Laterality: N/A;   EYE SURGERY     HYSTEROSCOPY  06/2010  D&C   POLYPECTOMY  11/16/2023   Procedure: POLYPECTOMY;  Surgeon: Regis Bill, MD;  Location: [facility];  Service: Endoscopy;;   PORTACATH PLACEMENT Right 09/11/2018   Procedure: INSERTION PORT-A-CATH;  Surgeon: Ancil Linsey, MD;  Location: [facility];  Service: General;  Laterality: Right;   TEE WITHOUT CARDIOVERSION Right 11/02/2020   Procedure: TRANSESOPHAGEAL ECHOCARDIOGRAM (TEE);  Surgeon: Laurier Nancy, MD;  Location: [facility];  Service: Cardiovascular;  Laterality: Right;   TUBAL LIGATION      OB History      Gravida  3   Para  2   Term  2   Preterm      AB  1   Living  3      SAB  1   IAB      Ectopic      Multiple  1   Live Births  3            Home Medications    Prior to Admission medications   Medication Sig Start Date End Date Taking? Authorizing Provider  amLODipine-benazepril (LOTREL) 10-20 MG capsule Take 1 capsule by mouth daily. 07/03/23   Miki Kins, FNP  anastrozole (ARIMIDEX) 1 MG tablet TAKE 1 TABLET BY MOUTH ONCE DAILY 10/04/23   Jeralyn Ruths, MD  benzonatate (TESSALON PERLES) 100 MG capsule Take 1 capsule (100 mg total) by mouth 3 (three) times daily as needed for cough. Patient not taking: Reported on 11/16/2023 10/03/23   Miki Kins, FNP  fluticasone [facility]) 50 MCG/ACT nasal spray Place 2 sprays into both nostrils daily as needed. 11/15/23   Miki Kins, FNP  linaclotide [facility]) 290 MCG CAPS capsule Take 1 capsule (290 mcg total) by mouth daily before breakfast. 04/02/23   Miki Kins, FNP  LORazepam (ATIVAN) 0.5 MG tablet Take 1 tablet (0.5 mg total) by mouth every 8 (eight) hours. 11/19/23   Miki Kins, FNP  meloxicam (MOBIC) 15 MG tablet Take 1 tablet (15 mg total) by mouth daily. Patient not taking: Reported on 10/03/2023 06/26/23 03/22/24  Felecia Shelling, DPM  Phendimetrazine Tartrate 105 MG CP24 Take 1 capsule (105 mg total) by mouth daily. Patient not taking: Reported on 10/03/2023 06/26/23   Miki Kins, FNP  predniSONE (STERAPRED UNI-PAK 21 TAB) 10 MG (21) TBPK tablet Take by mouth daily. As directed Patient not taking: Reported on 11/16/2023 10/07/23   Mickie Bail, NP    Family History Family History  Problem Relation Age of Onset   Hypertension Mother    Cancer Mother 41       LUNG   Hypertension Father    Colon cancer Paternal Uncle    Breast cancer Neg Hx     Social History Social History   Tobacco Use   Smoking status: Never   Smokeless tobacco: Never  Vaping Use   Vaping status: Never  Used  Substance Use Topics   Alcohol use: Yes    Alcohol/week: 3.0 standard drinks of alcohol    Types: 3 Shots of liquor per week    Comment: social drinker   Drug use: No     Allergies   Patient has no known allergies.   Review of Systems Review of Systems   Physical Exam Triage Vital Signs ED Triage Vitals  Encounter Vitals Group     BP 11/24/23 0958 (!) 136/92     Systolic BP Percentile --      Diastolic BP Percentile --  Pulse Rate 11/24/23 0958 93     Resp 11/24/23 0958 17     Temp 11/24/23 0958 98.3 F (36.8 C)     Temp Source 11/24/23 0958 Oral     SpO2 11/24/23 0958 96 %     Weight --      Height --      Head Circumference --      Peak Flow --      Pain Score 11/24/23 1007 0     Pain Loc --      Pain Education --      Exclude from Growth Chart --    No data found.  Updated Vital Signs BP (!) 136/92 (BP Location: Left Arm)   Pulse 93   Temp 98.3 F (36.8 C) (Oral)   Resp 17   SpO2 96%   Visual Acuity Right Eye Distance:   Left Eye Distance:   Bilateral Distance:    Right Eye Near:   Left Eye Near:    Bilateral Near:     Physical Exam Constitutional:      Appearance: Normal appearance.  HENT:     Head: Normocephalic.     Right Ear: Tympanic membrane, ear canal and external ear normal.     Left Ear: Tympanic membrane, ear canal and external ear normal.     Nose: Congestion present.     Mouth/Throat:     Pharynx: Oropharyngeal exudate and posterior oropharyngeal erythema present.     Tonsils: Tonsillar exudate present. 2+ on the right. 2+ on the left.  Eyes:     Extraocular Movements: Extraocular movements intact.  Cardiovascular:     Rate and Rhythm: Normal rate and regular rhythm.     Pulses: Normal pulses.     Heart sounds: Normal heart sounds.  Pulmonary:     Effort: Pulmonary effort is normal.     Breath sounds: Normal breath sounds.  Musculoskeletal:     Cervical back: Normal range of motion.  Lymphadenopathy:      Cervical: Cervical adenopathy present.  Neurological:     Mental Status: She is alert and oriented to person, place, and time. Mental status is at baseline.      UC Treatments / Results  Labs (all labs ordered are listed, but only abnormal results are displayed) Labs Reviewed  POC COVID19/FLU A&B COMBO - Normal  POCT RAPID STREP A (OFFICE) - Normal    EKG   Radiology No results found.  Procedures Procedures (including critical care time)  Medications Ordered in UC Medications - No data to display  Initial Impression / Assessment and Plan / UC Course  I have reviewed the triage vital signs and the nursing notes.  Pertinent labs & imaging results that were available during my care of the patient were reviewed by me and considered in my medical decision making (see chart for details).  Viral URI with cough  Patient is in no signs of distress nor toxic appearing.  Vital signs are stable.  Low suspicion for pneumonia, pneumothorax or bronchitis and therefore will defer imaging.  COVID flu and strep test negative.May use additional over-the-counter medications as needed for supportive care.  May follow-up with urgent care as needed if symptoms persist or worsen.  Final Clinical Impressions(s) / UC Diagnoses   Final diagnoses:  Viral URI with cough     Discharge Instructions      Declined avs   ED Prescriptions   None    PDMP not reviewed this encounter.  Valinda Hoar, NP 11/24/23 1058

## 2023-12-19 ENCOUNTER — Encounter: Payer: Self-pay | Admitting: Podiatry

## 2023-12-19 ENCOUNTER — Ambulatory Visit: Admitting: Podiatry

## 2023-12-19 VITALS — Ht 66.0 in | Wt 224.0 lb

## 2023-12-19 DIAGNOSIS — M722 Plantar fascial fibromatosis: Secondary | ICD-10-CM | POA: Diagnosis not present

## 2023-12-19 MED ORDER — BETAMETHASONE SOD PHOS & ACET 6 (3-3) MG/ML IJ SUSP
3.0000 mg | Freq: Once | INTRAMUSCULAR | Status: AC
  Administered 2023-12-19: 3 mg via INTRA_ARTICULAR

## 2023-12-19 MED ORDER — MELOXICAM 15 MG PO TABS: 15.0000 mg | ORAL_TABLET | Freq: Every day | ORAL | 1 refills | Status: DC

## 2023-12-19 NOTE — Progress Notes (Signed)
   Chief Complaint  Patient presents with   Foot Pain    Patient is here for bilateral plantar fasciitis and would like injections again at this time Extreme aching and burning last Friday and Saturday now they are just sore    Subjective: 63 y.o. female presenting today for follow-up evaluation of plantar fasciitis bilateral.  Patient has had recent acute flareup of plantar fasciitis.  Past Medical History:  Diagnosis Date   Anginal pain (HCC)    Anxiety    Arthritis    BRCA negative 10/2021   MyRisk neg   Breast cancer (HCC)    Carpal tunnel syndrome of right wrist    Coronary atherosclerosis of unspecified type of vessel, native or graft    Essential (hemorrhagic) thrombocythemia (HCC)    Hand and foot pain    HER2-positive carcinoma of breast (HCC)    Hyperlipidemia    Hypertension    LGSIL on Pap smear of cervix    Melanoma (HCC) 2020   MVP (mitral valve prolapse)    Personal history of chemotherapy    Personal history of radiation therapy    Sleep apnea    Vertigo      Objective: Physical Exam General: The patient is alert and oriented x3 in no acute distress.  Dermatology: Skin is warm, dry and supple bilateral lower extremities. Negative for open lesions or macerations bilateral.   Vascular: Dorsalis Pedis and Posterior Tibial pulses palpable bilateral.  Capillary fill time is immediate to all digits.  Neurological: Grossly intact via light touch  Musculoskeletal: Pain to palpation along plantar fascia bilateral all other joints range of motion within normal limits bilateral. Strength 5/5 in all groups bilateral.   Radiographic exam B/L feet 01/09/2023: Normal osseous mineralization.  Degenerative changes noted to the second TMT of the bilateral feet left greater than the right consistent with osteoarthritis.  No fractures identified.  Assessment: 1.  Chronic intermittent plantar fasciitis bilateral feet  Plan of Care:  -Patient evaluated.  -Injection  0.5 cc Celestone  Soluspan injected in bilateral plantar fascia -Again today discussed conservative long-term management and treatment for the plantar fasciitis. -Continue meloxicam  15 mg daily as needed.  Refill provided -daily stretching exercises specifically calf stretches  - Continue good supportive tennis shoes and sneakers - Continue arch supports as needed -Return to clinic as needed  Dot Gazella, DPM [facility]  Dr. Dot Gazella, DPM    [address] [place], [place] [address]                Office [phone]  Fax [phone]

## 2023-12-20 ENCOUNTER — Other Ambulatory Visit: Payer: Self-pay | Admitting: Family

## 2023-12-20 ENCOUNTER — Other Ambulatory Visit: Payer: Self-pay | Admitting: Oncology

## 2023-12-25 ENCOUNTER — Ambulatory Visit
Admission: RE | Admit: 2023-12-25 | Discharge: 2023-12-25 | Disposition: A | Discharge status: Home / Self Care | Source: Ambulatory Visit | Attending: Oncology | Admitting: Oncology

## 2023-12-25 DIAGNOSIS — Z170 Estrogen receptor positive status [ER+]: Secondary | ICD-10-CM | POA: Insufficient documentation

## 2023-12-25 DIAGNOSIS — C50211 Malignant neoplasm of upper-inner quadrant of right female breast: Secondary | ICD-10-CM | POA: Insufficient documentation

## 2023-12-25 DIAGNOSIS — Z1231 Encounter for screening mammogram for malignant neoplasm of breast: Secondary | ICD-10-CM | POA: Diagnosis not present

## 2024-01-01 ENCOUNTER — Ambulatory Visit: Admitting: Podiatry

## 2024-02-11 ENCOUNTER — Other Ambulatory Visit: Payer: Self-pay | Admitting: Family

## 2024-03-13 ENCOUNTER — Telehealth: Payer: Self-pay

## 2024-03-13 ENCOUNTER — Inpatient Hospital Stay: Payer: Self-pay | Admitting: Oncology

## 2024-03-13 NOTE — Telephone Encounter (Signed)
 Called to speak with patient in regards to her scheduled MyChart visit with Dr. Georgina today at 2:45 pm. She told me that she wasn't wanting to keep her appointment for today, and that after today's visit she would no longer be seeing him. She said that she finished the letrozole  and that she would continue getting her mammograms. I offered for our schedulers to reach out to her to get her rescheduled and that is when she told me that she didn't feel like that was necessary. Patient told me that she wasn't scared to reach out to Dr. Georgina if she needed him.

## 2024-03-14 NOTE — Progress Notes (Signed)
 This encounter was created in error - please disregard.

## 2024-04-01 ENCOUNTER — Ambulatory Visit: Admitting: Podiatry

## 2024-04-01 ENCOUNTER — Encounter: Payer: Self-pay | Admitting: Podiatry

## 2024-04-01 VITALS — Ht 66.0 in | Wt 224.0 lb

## 2024-04-01 DIAGNOSIS — M722 Plantar fascial fibromatosis: Secondary | ICD-10-CM | POA: Diagnosis not present

## 2024-04-01 MED ORDER — MELOXICAM 15 MG PO TABS: 15.0000 mg | ORAL_TABLET | Freq: Every day | ORAL | 1 refills | Status: DC

## 2024-04-01 MED ORDER — METHYLPREDNISOLONE 4 MG PO TBPK
ORAL_TABLET | ORAL | 0 refills | Status: DC
Start: 2024-04-01 — End: 2024-04-29

## 2024-04-01 MED ORDER — BETAMETHASONE SOD PHOS & ACET 6 (3-3) MG/ML IJ SUSP
3.0000 mg | Freq: Once | INTRAMUSCULAR | Status: AC
  Administered 2024-04-01: 3 mg via INTRA_ARTICULAR

## 2024-04-01 NOTE — Progress Notes (Signed)
   Chief Complaint  Patient presents with   Plantar Fasciitis    Pt is here due to bilateral foot pain, states she has plantar fasciitis has been having a flare up for quite a while, pain usually goes away with time but this it has not, received an injection on her last visit states that helped some, wants to know what else can be done for the pain, states since its summer so she has not been wearing tennis shoes.    Subjective: 63 y.o. female presenting today for follow-up evaluation of plantar fasciitis bilateral.  Patient has had recent acute flareup of plantar fasciitis.  Past Medical History:  Diagnosis Date   Anginal pain (HCC)    Anxiety    Arthritis    BRCA negative 10/2021   MyRisk neg   Breast cancer (HCC)    Carpal tunnel syndrome of right wrist    Coronary atherosclerosis of unspecified type of vessel, native or graft    Essential (hemorrhagic) thrombocythemia (HCC)    Hand and foot pain    HER2-positive carcinoma of breast (HCC)    Hyperlipidemia    Hypertension    LGSIL on Pap smear of cervix    Melanoma (HCC) 2020   MVP (mitral valve prolapse)    Personal history of chemotherapy    Personal history of radiation therapy    Sleep apnea    Vertigo      Objective: Physical Exam General: The patient is alert and oriented x3 in no acute distress.  Dermatology: Skin is warm, dry and supple bilateral lower extremities. Negative for open lesions or macerations bilateral.   Vascular: Dorsalis Pedis and Posterior Tibial pulses palpable bilateral.  Capillary fill time is immediate to all digits.  Neurological: Grossly intact via light touch  Musculoskeletal: Pain to palpation along plantar fascia bilateral all other joints range of motion within normal limits bilateral. Strength 5/5 in all groups bilateral.   Radiographic exam B/L feet 01/09/2023: Normal osseous mineralization.  Degenerative changes noted to the second TMT of the bilateral feet left greater than the  right consistent with osteoarthritis.  No fractures identified.  Assessment: 1.  Chronic intermittent plantar fasciitis bilateral feet  Plan of Care:  -Patient evaluated.  -Injection 0.5 cc Celestone  Soluspan injected in bilateral plantar fascia -Again today discussed conservative long-term management and treatment for the plantar fasciitis. -Prescription for Medrol  Dosepak -Continue meloxicam  15 mg daily as needed.  Refill provided -daily stretching exercises specifically calf stretches  - Continue good supportive tennis shoes and sneakers - Continue arch supports as needed -Return to clinic as needed  *Going on a trip w/ her friends to the [facility] 06/05/2024  Thresa EMERSON Sar, DPM [facility]  Dr. Thresa EMERSON Sar, DPM    [address] [place], [place] [address]                Office [phone]  Fax [phone]

## 2024-04-27 ENCOUNTER — Encounter: Payer: Self-pay | Admitting: Podiatry

## 2024-04-29 ENCOUNTER — Other Ambulatory Visit: Payer: Self-pay | Admitting: Podiatry

## 2024-04-29 MED ORDER — METHYLPREDNISOLONE 4 MG PO TBPK: ORAL_TABLET | ORAL | 0 refills | Status: DC

## 2024-05-13 ENCOUNTER — Ambulatory Visit: Admitting: Podiatry

## 2024-05-13 DIAGNOSIS — M722 Plantar fascial fibromatosis: Secondary | ICD-10-CM | POA: Diagnosis not present

## 2024-05-13 MED ORDER — MELOXICAM 15 MG PO TABS: 15.0000 mg | ORAL_TABLET | Freq: Every day | ORAL | 1 refills | Status: AC

## 2024-05-13 NOTE — Progress Notes (Signed)
   Chief Complaint  Patient presents with   Plantar Fasciitis    6 week follow up bilateral feet    Subjective: 63 y.o. female presenting today for follow-up evaluation of plantar fasciitis bilateral.  Patient has had recent acute flareup of plantar fasciitis.  Past Medical History:  Diagnosis Date   Anginal pain (HCC)    Anxiety    Arthritis    BRCA negative 10/2021   MyRisk neg   Breast cancer (HCC)    Carpal tunnel syndrome of right wrist    Coronary atherosclerosis of unspecified type of vessel, native or graft    Essential (hemorrhagic) thrombocythemia (HCC)    Hand and foot pain    HER2-positive carcinoma of breast (HCC)    Hyperlipidemia    Hypertension    LGSIL on Pap smear of cervix    Melanoma (HCC) 2020   MVP (mitral valve prolapse)    Personal history of chemotherapy    Personal history of radiation therapy    Sleep apnea    Vertigo      Objective: Physical Exam General: The patient is alert and oriented x3 in no acute distress.  Dermatology: Skin is warm, dry and supple bilateral lower extremities. Negative for open lesions or macerations bilateral.   Vascular: Dorsalis Pedis and Posterior Tibial pulses palpable bilateral.  Capillary fill time is immediate to all digits.  Neurological: Grossly intact via light touch  Musculoskeletal: Pain to palpation along plantar fascia bilateral all other joints range of motion within normal limits bilateral. Strength 5/5 in all groups bilateral.   Radiographic exam B/L feet 01/09/2023: Normal osseous mineralization.  Degenerative changes noted to the second TMT of the bilateral feet left greater than the right consistent with osteoarthritis.  No fractures identified.  Assessment: 1.  Chronic intermittent plantar fasciitis bilateral feet  Plan of Care:  -Patient evaluated.  -Continue arch supports and good supportive tennis shoes -Patient is currently on a prednisone  pack continue until completed -Continue  meloxicam  50 mg daily.  Refill sent to the pharmacy -Patient will be leaving on a trip in the [facility] doing a significant mental walking on 05/30/2024.  Return to clinic just prior for cortisone injections  *Going on a trip w/ her friends to the [facility] 05/30/2024  Thresa EMERSON Sar, DPM [facility]  Dr. Thresa EMERSON Sar, DPM    [address] [place], [place] [address]                Office [phone]  Fax [phone]

## 2024-05-19 ENCOUNTER — Other Ambulatory Visit: Payer: Self-pay | Admitting: Family

## 2024-05-27 ENCOUNTER — Encounter: Payer: Self-pay | Admitting: Podiatry

## 2024-05-27 ENCOUNTER — Ambulatory Visit: Admitting: Podiatry

## 2024-05-27 VITALS — Ht 66.0 in | Wt 224.0 lb

## 2024-05-27 DIAGNOSIS — M722 Plantar fascial fibromatosis: Secondary | ICD-10-CM | POA: Diagnosis not present

## 2024-05-27 MED ORDER — BETAMETHASONE SOD PHOS & ACET 6 (3-3) MG/ML IJ SUSP
3.0000 mg | Freq: Once | INTRAMUSCULAR | Status: AC
  Administered 2024-05-27: 3 mg via INTRA_ARTICULAR

## 2024-05-27 NOTE — Progress Notes (Signed)
   Chief Complaint  Patient presents with   Injections    Pt is here to receive injection into both feet.    Subjective: 63 y.o. female presenting today for follow-up evaluation of plantar fasciitis bilateral.    Past Medical History:  Diagnosis Date   Anginal pain    Anxiety    Arthritis    BRCA negative 10/2021   MyRisk neg   Breast cancer (HCC)    Carpal tunnel syndrome of right wrist    Coronary atherosclerosis of unspecified type of vessel, native or graft    Essential (hemorrhagic) thrombocythemia (HCC)    Hand and foot pain    HER2-positive carcinoma of breast (HCC)    Hyperlipidemia    Hypertension    LGSIL on Pap smear of cervix    Melanoma (HCC) 2020   MVP (mitral valve prolapse)    Personal history of chemotherapy    Personal history of radiation therapy    Sleep apnea    Vertigo      Objective: Physical Exam General: The patient is alert and oriented x3 in no acute distress.  Dermatology: Skin is warm, dry and supple bilateral lower extremities. Negative for open lesions or macerations bilateral.   Vascular: Dorsalis Pedis and Posterior Tibial pulses palpable bilateral.  Capillary fill time is immediate to all digits.  Neurological: Grossly intact via light touch  Musculoskeletal: Pain to palpation along plantar fascia bilateral all other joints range of motion within normal limits bilateral. Strength 5/5 in all groups bilateral.   Radiographic exam B/L feet 01/09/2023: Normal osseous mineralization.  Degenerative changes noted to the second TMT of the bilateral feet left greater than the right consistent with osteoarthritis.  No fractures identified.  Assessment: 1.  Chronic intermittent plantar fasciitis bilateral feet  Plan of Care:  -Patient evaluated.  -Continue arch supports and good supportive tennis shoes - Injection of 0.5 cc Celestone  Soluspan injected in the plantar fascia bilateral -Continue meloxicam  50 mg daily.  Refill sent to the  pharmacy -Patient will be leaving on a trip in the [facility] doing a significant mental walking on 05/30/2024.  -Today again we discussed surgical intervention for the patient.  Unfortunately she has had chronic intermittent plantar fasciitis for several years spite conservative care and treatment and management.  I do believe that surgery is warranted at this time.  Today we discussed in length in detail endoscopic plantar fasciotomy.  Risk benefits advantages and disadvantages of the procedure were explained.  No guarantees were expressed or implied.  All patient questions answered.  After discussion she would like to pursue this option -Authorization for surgery was initiated today.  Surgery will consist of endoscopic plantar fasciotomy bilateral -Return to clinic 1 week postop  *Going on a trip w/ her friends to the [facility] 05/30/2024. Will visit [place], [place], and stay at the [place] [facility] the [facility], DPM [facility]  Dr. Thresa EMERSON Sar, DPM    [address] [place], [place] [address]                Office [phone]  Fax [phone]

## 2024-06-10 ENCOUNTER — Telehealth: Payer: Self-pay | Admitting: Podiatry

## 2024-06-10 NOTE — Telephone Encounter (Signed)
 Called and left message for patient to contact me back to schedule sx.

## 2024-06-16 ENCOUNTER — Other Ambulatory Visit: Payer: Self-pay | Admitting: Family

## 2024-07-01 ENCOUNTER — Ambulatory Visit: Admitting: Podiatry

## 2024-07-01 ENCOUNTER — Encounter: Payer: Self-pay | Admitting: Podiatry

## 2024-07-01 VITALS — Ht 66.0 in | Wt 224.0 lb

## 2024-07-01 DIAGNOSIS — M722 Plantar fascial fibromatosis: Secondary | ICD-10-CM

## 2024-07-01 NOTE — Progress Notes (Signed)
   No chief complaint on file.   Subjective: 63 y.o. female presenting today for follow-up evaluation of plantar fasciitis bilateral.  Patient informs me that unfortunately her insurance plan has changed for the beginning of next year and [facility] will no longer be on a provider list.  She is very frustrated by this and wants to discuss today  Past Medical History:  Diagnosis Date   Anginal pain    Anxiety    Arthritis    BRCA negative 10/2021   MyRisk neg   Breast cancer (HCC)    Carpal tunnel syndrome of right wrist    Coronary atherosclerosis of unspecified type of vessel, native or graft    Essential (hemorrhagic) thrombocythemia (HCC)    Hand and foot pain    HER2-positive carcinoma of breast (HCC)    Hyperlipidemia    Hypertension    LGSIL on Pap smear of cervix    Melanoma (HCC) 2020   MVP (mitral valve prolapse)    Personal history of chemotherapy    Personal history of radiation therapy    Sleep apnea    Vertigo      Objective: Physical Exam General: The patient is alert and oriented x3 in no acute distress.  Dermatology: Skin is warm, dry and supple bilateral lower extremities. Negative for open lesions or macerations bilateral.   Vascular: Dorsalis Pedis and Posterior Tibial pulses palpable bilateral.  Capillary fill time is immediate to all digits.  Neurological: Grossly intact via light touch  Musculoskeletal: Unchanged.  Pain to palpation along plantar fascia bilateral all other joints range of motion within normal limits bilateral. Strength 5/5 in all groups bilateral.   Radiographic exam B/L feet 01/09/2023: Normal osseous mineralization.  Degenerative changes noted to the second TMT of the bilateral feet left greater than the right consistent with osteoarthritis.  No fractures identified.  Assessment: 1.  Chronic intermittent plantar fasciitis bilateral feet  Plan of Care:  -Patient evaluated.  - Today we discussed moving forward.  She does not  want to change providers.  For now we will continue to pursue conservative treatment management until she reaches eligibility for Medicare when she turns 64 in August 2026. -In the meantime continue meloxicam  15 mg daily.  Refill sent to the pharmacy -Continue good supportive tennis shoes and sneakers - Patient is leaving for a trip 07/12/2025 to [place], [place].  She would like to return to our office a few days prior for cortisone injection since they only helped temporarily -Return to clinic next week for cortisone injections   Thresa EMERSON Sar, DPM [facility]  Dr. Thresa EMERSON Sar, DPM    [address] [place], [place] [address]                Office [phone]  Fax [phone]

## 2024-07-08 ENCOUNTER — Ambulatory Visit: Admitting: Family

## 2024-07-09 ENCOUNTER — Ambulatory Visit: Admitting: Podiatry

## 2024-07-09 ENCOUNTER — Encounter: Payer: Self-pay | Admitting: Podiatry

## 2024-07-09 VITALS — Ht 66.0 in | Wt 224.0 lb

## 2024-07-09 DIAGNOSIS — M722 Plantar fascial fibromatosis: Secondary | ICD-10-CM

## 2024-07-09 MED ORDER — BETAMETHASONE SOD PHOS & ACET 6 (3-3) MG/ML IJ SUSP
3.0000 mg | Freq: Once | INTRAMUSCULAR | Status: AC
  Administered 2024-07-09: 3 mg via INTRA_ARTICULAR

## 2024-07-09 NOTE — Progress Notes (Signed)
   Chief Complaint  Patient presents with   Plantar Fasciitis    Pt is here to f/u on bilateral foot pain due to plantar fasciitis, would like injections.    Subjective: 63 y.o. female presenting today for follow-up evaluation of plantar fasciitis bilateral.    Past Medical History:  Diagnosis Date   Anginal pain    Anxiety    Arthritis    BRCA negative 10/2021   MyRisk neg   Breast cancer (HCC)    Carpal tunnel syndrome of right wrist    Coronary atherosclerosis of unspecified type of vessel, native or graft    Essential (hemorrhagic) thrombocythemia (HCC)    Hand and foot pain    HER2-positive carcinoma of breast (HCC)    Hyperlipidemia    Hypertension    LGSIL on Pap smear of cervix    Melanoma (HCC) 2020   MVP (mitral valve prolapse)    Personal history of chemotherapy    Personal history of radiation therapy    Sleep apnea    Vertigo      Objective: Physical Exam General: The patient is alert and oriented x3 in no acute distress.  Dermatology: Skin is warm, dry and supple bilateral lower extremities. Negative for open lesions or macerations bilateral.   Vascular: Dorsalis Pedis and Posterior Tibial pulses palpable bilateral.  Capillary fill time is immediate to all digits.  Neurological: Grossly intact via light touch  Musculoskeletal: Unchanged.  Pain to palpation along plantar fascia bilateral all other joints range of motion within normal limits bilateral. Strength 5/5 in all groups bilateral.   Radiographic exam B/L feet 01/09/2023: Normal osseous mineralization.  Degenerative changes noted to the second TMT of the bilateral feet left greater than the right consistent with osteoarthritis.  No fractures identified.  Assessment: 1.  Chronic intermittent plantar fasciitis bilateral feet  Plan of Care:  -Patient evaluated.  - Visit on 07/01/2024 we did discuss moving forward and managing her plantar fasciitis with change of insurance in mind.  She does not  want to change providers. we will continue to pursue conservative treatment management until she reaches eligibility for Medicare when she turns 64 in August 2026. - Continue meloxicam  15 mg daily PRN -Continue good supportive tennis shoes and sneakers -Injection of 0.5 cc Celestone  Soluspan injected in the plantar fascia bilateral -Return to clinic PRN  *Leaving for a trip 07/12/2025 to [place], [place].     Thresa EMERSON Sar, DPM [facility]  Dr. Thresa EMERSON Sar, DPM    [address] [place], [place] [address]                Office [phone]  Fax [phone]

## 2024-07-14 ENCOUNTER — Other Ambulatory Visit: Payer: Self-pay | Admitting: Family

## 2024-07-29 ENCOUNTER — Ambulatory Visit: Admitting: Family

## 2024-07-29 ENCOUNTER — Encounter: Payer: Self-pay | Admitting: Family

## 2024-07-29 VITALS — BP 130/94 | HR 75 | Ht 66.0 in | Wt 236.0 lb

## 2024-07-29 DIAGNOSIS — F411 Generalized anxiety disorder: Secondary | ICD-10-CM | POA: Diagnosis not present

## 2024-07-29 DIAGNOSIS — I10 Essential (primary) hypertension: Secondary | ICD-10-CM

## 2024-07-29 DIAGNOSIS — R7303 Prediabetes: Secondary | ICD-10-CM

## 2024-07-29 DIAGNOSIS — E538 Deficiency of other specified B group vitamins: Secondary | ICD-10-CM | POA: Diagnosis not present

## 2024-07-29 DIAGNOSIS — E559 Vitamin D deficiency, unspecified: Secondary | ICD-10-CM | POA: Diagnosis not present

## 2024-07-29 DIAGNOSIS — R5383 Other fatigue: Secondary | ICD-10-CM | POA: Diagnosis not present

## 2024-07-29 DIAGNOSIS — E782 Mixed hyperlipidemia: Secondary | ICD-10-CM

## 2024-07-29 NOTE — Progress Notes (Signed)
 Established Patient Office Visit  Subjective:  Patient ID: Erin Church, female    DOB: 03-13-1961  Age: 63 y.o. MRN: [id number]  Chief Complaint  Patient presents with   Follow-up    6 month follow up, medication refills    Patient is here today for her 6 months follow up.  She has been feeling fairly well since last appointment.   She does not have additional concerns to discuss today.  Labs are due today.  She needs refills.   I have reviewed her active problem list, medication list, allergies, notes from last encounter, lab results for her appointment today.      No other concerns at this time.   Past Medical History:  Diagnosis Date   Anginal pain    Anxiety    Arthritis    BRCA negative 10/2021   MyRisk neg   Breast cancer (HCC)    Carpal tunnel syndrome of right wrist    Coronary atherosclerosis of unspecified type of vessel, native or graft    Essential (hemorrhagic) thrombocythemia (HCC)    Hand and foot pain    HER2-positive carcinoma of breast (HCC)    Hyperlipidemia    Hypertension    LGSIL on Pap smear of cervix    Melanoma (HCC) 2020   MVP (mitral valve prolapse)    Personal history of chemotherapy    Personal history of radiation therapy    Sleep apnea    Vertigo     Past Surgical History:  Procedure Laterality Date   BREAST BIOPSY Right 08/27/2018   us  bx, [facility]   BREAST LUMPECTOMY Right 09/11/2018   BREAST LUMPECTOMY WITH NEEDLE LOCALIZATION AND AXILLARY SENTINEL LYMPH NODE BX Right 09/11/2018   Procedure: BREAST LUMPECTOMY WITH NEEDLE LOCALIZATION AND SENTINEL LYMPH NODE BX;  Surgeon: Nicholaus Selinda Birmingham, MD;  Location: [facility] OR[facility];  Service: General;  Laterality: Right;   CARDIAC CATHETERIZATION     CATARACT EXTRACTION W/PHACO Left 01/01/2017   Procedure: CATARACT EXTRACTION PHACO AND INTRAOCULAR LENS PLACEMENT (IOC)left Restor lens;  Surgeon: Mittie Gaskin, MD;  Location: [facility];  Service: Ophthalmology;  Laterality:  Left;  Restor lens   CATARACT EXTRACTION W/PHACO Right 01/31/2017   Procedure: CATARACT EXTRACTION PHACO AND INTRAOCULAR LENS PLACEMENT (IOC);  Surgeon: Mittie Gaskin, MD;  Location: [facility];  Service: Ophthalmology;  Laterality: Right;   CESAREAN SECTION     CHOLECYSTECTOMY     COLONOSCOPY WITH PROPOFOL  N/A 11/16/2023   Procedure: COLONOSCOPY WITH PROPOFOL ;  Surgeon: Maryruth Ole DASEN, MD;  Location: [facility];  Service: Endoscopy;  Laterality: N/A;   EYE SURGERY     HYSTEROSCOPY  06/2010   D&C   POLYPECTOMY  11/16/2023   Procedure: POLYPECTOMY;  Surgeon: Maryruth Ole DASEN, MD;  Location: [facility];  Service: Endoscopy;;   PORTACATH PLACEMENT Right 09/11/2018   Procedure: INSERTION PORT-A-CATH;  Surgeon: Nicholaus Selinda Birmingham, MD;  Location: [facility];  Service: General;  Laterality: Right;   TEE WITHOUT CARDIOVERSION Right 11/02/2020   Procedure: TRANSESOPHAGEAL ECHOCARDIOGRAM (TEE);  Surgeon: Fernand Denyse LABOR, MD;  Location: [facility];  Service: Cardiovascular;  Laterality: Right;   TUBAL LIGATION      Social History   Socioeconomic History   Marital status: Widowed    Spouse name: Not on file   Number of children: 3   Years of education: 64   Highest education level: Not on file  Occupational History   Occupation: SELF EMPLOYED  Tobacco Use   Smoking status: Never  Smokeless tobacco: Never  Vaping Use   Vaping status: Never Used  Substance and Sexual Activity   Alcohol use: Yes    Alcohol/week: 3.0 standard drinks of alcohol    Types: 3 Shots of liquor per week    Comment: social drinker   Drug use: No   Sexual activity: Yes    Birth control/protection: Post-menopausal  Other Topics Concern   Not on file  Social History Narrative   Not on file   Social Drivers of Health   Financial Resource Strain: Not on file  Food Insecurity: Not on file  Transportation Needs: Not on file  Physical Activity: Not on file  Stress: Not on file  Social  Connections: Not on file  Intimate Partner Violence: Not on file    Family History  Problem Relation Age of Onset   Hypertension Mother    Cancer Mother 51       LUNG   Hypertension Father    Colon cancer Paternal Uncle    Breast cancer Neg Hx     No Known Allergies  Review of Systems  Psychiatric/Behavioral:  The patient is nervous/anxious.   All other systems reviewed and are negative.      Objective:   BP (!) 130/94   Pulse 75   Ht 5' 6 (1.676 m)   Wt 236 lb (107 kg)   SpO2 98%   BMI 38.09 kg/m   Vitals:   07/29/24 0945  BP: (!) 130/94  Pulse: 75  Height: 5' 6 (1.676 m)  Weight: 236 lb (107 kg)  SpO2: 98%  BMI (Calculated): 38.11    Physical Exam Vitals and nursing note reviewed.  Constitutional:      Appearance: Normal appearance. She is obese.  HENT:     Head: Normocephalic.  Eyes:     Extraocular Movements: Extraocular movements intact.     Conjunctiva/sclera: Conjunctivae normal.     Pupils: Pupils are equal, round, and reactive to light.  Cardiovascular:     Rate and Rhythm: Normal rate.  Pulmonary:     Effort: Pulmonary effort is normal.  Neurological:     General: No focal deficit present.     Mental Status: She is alert and oriented to person, place, and time. Mental status is at baseline.  Psychiatric:        Mood and Affect: Mood normal.        Behavior: Behavior normal.        Thought Content: Thought content normal.        Judgment: Judgment normal.      No results found for any visits on 07/29/24.  No results found for this or any previous visit (from the past 2160 hours).     Assessment & Plan Mixed hyperlipidemia Checking labs today.  Continue current therapy for lipid control. Will modify as needed based on labwork results.   -CMP w/eGFR -Lipid Panel  Essential hypertension, benign Blood pressure well controlled with current medications.  Continue current therapy.  Will reassess at follow up.   - CBC w/Diff -  CMP w/eGFR  Generalized anxiety disorder Patient stable.  Well controlled with current therapy.   Continue current meds.   Vitamin D  deficiency, unspecified B12 deficiency due to diet Other fatigue Checking labs today.  Will continue supplements as needed.   - Vitamin D  - Vitamin B12 - TSH  Prediabetes A1C Continues to be in prediabetic ranges.  Will reassess at follow up after next lab check.  Patient counseled  on dietary choices and verbalized understanding.   -CBC w/Diff -CMP w/eGFR -Hemoglobin A1C     Return in about 6 months (around 01/27/2025).   Total time spent: 20 minutes  ALAN CHRISTELLA ARRANT, FNP  07/29/2024   This document may have been prepared by [facility] Voice Recognition software and as such may include unintentional dictation errors.

## 2024-07-30 ENCOUNTER — Other Ambulatory Visit

## 2024-07-30 DIAGNOSIS — E559 Vitamin D deficiency, unspecified: Secondary | ICD-10-CM | POA: Diagnosis not present

## 2024-07-30 DIAGNOSIS — F411 Generalized anxiety disorder: Secondary | ICD-10-CM | POA: Diagnosis not present

## 2024-07-30 DIAGNOSIS — I10 Essential (primary) hypertension: Secondary | ICD-10-CM | POA: Diagnosis not present

## 2024-07-30 DIAGNOSIS — E538 Deficiency of other specified B group vitamins: Secondary | ICD-10-CM | POA: Diagnosis not present

## 2024-07-30 DIAGNOSIS — R5383 Other fatigue: Secondary | ICD-10-CM | POA: Diagnosis not present

## 2024-07-30 DIAGNOSIS — E782 Mixed hyperlipidemia: Secondary | ICD-10-CM | POA: Diagnosis not present

## 2024-07-30 DIAGNOSIS — R7303 Prediabetes: Secondary | ICD-10-CM | POA: Diagnosis not present

## 2024-07-31 LAB — CBC WITH DIFFERENTIAL/PLATELET
Basophils Absolute: 0.1 x10E3/uL (ref 0.0–0.2)
Basos: 2 %
EOS (ABSOLUTE): 0.2 x10E3/uL (ref 0.0–0.4)
Eos: 3 %
Hematocrit: 47.5 % — ABNORMAL HIGH (ref 34.0–46.6)
Hemoglobin: 15.4 g/dL (ref 11.1–15.9)
Immature Grans (Abs): 0 x10E3/uL (ref 0.0–0.1)
Immature Granulocytes: 0 %
Lymphocytes Absolute: 2.2 x10E3/uL (ref 0.7–3.1)
Lymphs: 32 %
MCH: 30.3 pg (ref 26.6–33.0)
MCHC: 32.4 g/dL (ref 31.5–35.7)
MCV: 94 fL (ref 79–97)
Monocytes Absolute: 0.9 x10E3/uL (ref 0.1–0.9)
Monocytes: 13 %
Neutrophils Absolute: 3.5 x10E3/uL (ref 1.4–7.0)
Neutrophils: 50 %
Platelets: 358 x10E3/uL (ref 150–450)
RBC: 5.08 x10E6/uL (ref 3.77–5.28)
RDW: 13 % (ref 11.7–15.4)
WBC: 6.8 x10E3/uL (ref 3.4–10.8)

## 2024-07-31 LAB — LIPID PANEL
Chol/HDL Ratio: 3.8 ratio (ref 0.0–4.4)
Cholesterol, Total: 203 mg/dL — ABNORMAL HIGH (ref 100–199)
HDL: 54 mg/dL (ref >39)
LDL Chol Calc (NIH): 122 mg/dL — ABNORMAL HIGH (ref 0–99)
Triglycerides: 154 mg/dL — ABNORMAL HIGH (ref 0–149)
VLDL Cholesterol Cal: 27 mg/dL (ref 5–40)

## 2024-07-31 LAB — VITAMIN B12: Vitamin B-12: 450 pg/mL (ref 232–1245)

## 2024-07-31 LAB — CMP14+EGFR
ALT: 14 IU/L (ref 0–32)
AST: 20 IU/L (ref 0–40)
Albumin: 4 g/dL (ref 3.9–4.9)
Alkaline Phosphatase: 102 IU/L (ref 49–135)
BUN/Creatinine Ratio: 16 (ref 12–28)
BUN: 13 mg/dL (ref 8–27)
Bilirubin Total: 0.4 mg/dL (ref 0.0–1.2)
CO2: 23 mmol/L (ref 20–29)
Calcium: 9.5 mg/dL (ref 8.7–10.3)
Chloride: 102 mmol/L (ref 96–106)
Creatinine, Ser: 0.79 mg/dL (ref 0.57–1.00)
Globulin, Total: 3.1 g/dL (ref 1.5–4.5)
Glucose: 99 mg/dL (ref 70–99)
Potassium: 4 mmol/L (ref 3.5–5.2)
Sodium: 139 mmol/L (ref 134–144)
Total Protein: 7.1 g/dL (ref 6.0–8.5)
eGFR: 83 mL/min/1.73 (ref >59)

## 2024-07-31 LAB — HEMOGLOBIN A1C
Est. average glucose Bld gHb Est-mCnc: 111 mg/dL
Hgb A1c MFr Bld: 5.5 % (ref 4.8–5.6)

## 2024-07-31 LAB — TSH: TSH: 3.84 u[IU]/mL (ref 0.450–4.500)

## 2024-07-31 LAB — VITAMIN D 25 HYDROXY (VIT D DEFICIENCY, FRACTURES): Vit D, 25-Hydroxy: 17.3 ng/mL — ABNORMAL LOW (ref 30.0–100.0)

## 2024-08-11 ENCOUNTER — Encounter: Payer: Self-pay | Admitting: Family

## 2024-08-11 NOTE — Assessment & Plan Note (Signed)
 Patient stable.  Well controlled with current therapy.   Continue current meds.

## 2024-08-11 NOTE — Assessment & Plan Note (Signed)
 Blood pressure well controlled with current medications.  Continue current therapy.  Will reassess at follow up.   - CBC w/Diff - CMP w/eGFR

## 2024-08-11 NOTE — Assessment & Plan Note (Signed)
 Checking labs today.  Continue current therapy for lipid control. Will modify as needed based on labwork results.   -CMP w/eGFR -Lipid Panel

## 2024-08-14 ENCOUNTER — Other Ambulatory Visit: Payer: Self-pay

## 2024-08-14 DIAGNOSIS — E559 Vitamin D deficiency, unspecified: Secondary | ICD-10-CM

## 2024-08-14 MED ORDER — VITAMIN D (ERGOCALCIFEROL) 1.25 MG (50000 UNIT) PO CAPS: 50000.0000 [IU] | ORAL_CAPSULE | ORAL | 3 refills | Status: AC

## 2024-08-14 MED ORDER — NEXLIZET 180-10 MG PO TABS: 1.0000 | ORAL_TABLET | Freq: Every day | ORAL | 2 refills | Status: AC

## 2024-08-19 ENCOUNTER — Other Ambulatory Visit: Payer: Self-pay | Admitting: Family

## 2024-08-19 ENCOUNTER — Other Ambulatory Visit: Payer: Self-pay | Admitting: Oncology

## 2024-08-27 NOTE — Progress Notes (Signed)
 Erin Church                                          MRN: [id number]   08/27/2024   The [facility] Quality Team Specialist reviewed this patient medical record for the purposes of chart review for care gap closure. The following were reviewed: chart review for care gap closure-controlling blood pressure.    [facility] Quality Team

## 2024-09-17 ENCOUNTER — Encounter: Payer: Self-pay | Admitting: Family
